# Patient Record
Sex: Male | Born: 1945
Health system: Southern US, Community
[De-identification: ages and names within clinical notes are randomized; demographics above are authoritative.]

## PROBLEM LIST (undated history)

## (undated) DIAGNOSIS — R7303 Prediabetes: Secondary | ICD-10-CM

## (undated) DIAGNOSIS — C61 Malignant neoplasm of prostate: Secondary | ICD-10-CM

## (undated) DIAGNOSIS — I10 Essential (primary) hypertension: Secondary | ICD-10-CM

## (undated) DIAGNOSIS — I5189 Other ill-defined heart diseases: Secondary | ICD-10-CM

## (undated) DIAGNOSIS — M545 Low back pain, unspecified: Secondary | ICD-10-CM

## (undated) DIAGNOSIS — I340 Nonrheumatic mitral (valve) insufficiency: Secondary | ICD-10-CM

## (undated) DIAGNOSIS — R911 Solitary pulmonary nodule: Secondary | ICD-10-CM

## (undated) DIAGNOSIS — R011 Cardiac murmur, unspecified: Secondary | ICD-10-CM

## (undated) DIAGNOSIS — H40059 Ocular hypertension, unspecified eye: Secondary | ICD-10-CM

## (undated) DIAGNOSIS — H9042 Sensorineural hearing loss, unilateral, left ear, with unrestricted hearing on the contralateral side: Secondary | ICD-10-CM

## (undated) DIAGNOSIS — K921 Melena: Secondary | ICD-10-CM

## (undated) DIAGNOSIS — I2510 Atherosclerotic heart disease of native coronary artery without angina pectoris: Secondary | ICD-10-CM

## (undated) DIAGNOSIS — T7840XA Allergy, unspecified, initial encounter: Secondary | ICD-10-CM

## (undated) DIAGNOSIS — L570 Actinic keratosis: Secondary | ICD-10-CM

## (undated) DIAGNOSIS — K635 Polyp of colon: Secondary | ICD-10-CM

## (undated) DIAGNOSIS — D2330 Other benign neoplasm of skin of unspecified part of face: Secondary | ICD-10-CM

## (undated) DIAGNOSIS — J302 Other seasonal allergic rhinitis: Secondary | ICD-10-CM

## (undated) DIAGNOSIS — I679 Cerebrovascular disease, unspecified: Secondary | ICD-10-CM

## (undated) DIAGNOSIS — Z8619 Personal history of other infectious and parasitic diseases: Secondary | ICD-10-CM

## (undated) DIAGNOSIS — I38 Endocarditis, valve unspecified: Secondary | ICD-10-CM

## (undated) DIAGNOSIS — C44621 Squamous cell carcinoma of skin of unspecified upper limb, including shoulder: Secondary | ICD-10-CM

## (undated) HISTORY — DX: Other benign neoplasm of skin of unspecified part of face: D23.30

## (undated) HISTORY — DX: Other ill-defined heart diseases: I51.89

## (undated) HISTORY — DX: Actinic keratosis: L57.0

## (undated) HISTORY — DX: Cardiac murmur, unspecified: R01.1

## (undated) HISTORY — DX: Melena: K92.1

## (undated) HISTORY — DX: Atherosclerotic heart disease of native coronary artery without angina pectoris: I25.10

## (undated) HISTORY — DX: Personal history of other infectious and parasitic diseases: Z86.19

## (undated) HISTORY — DX: Ocular hypertension, unspecified eye: H40.059

## (undated) HISTORY — DX: Malignant neoplasm of prostate: C61

## (undated) HISTORY — DX: Endocarditis, valve unspecified: I38

## (undated) HISTORY — DX: Polyp of colon: K63.5

## (undated) HISTORY — DX: Solitary pulmonary nodule: R91.1

## (undated) HISTORY — DX: Allergy, unspecified, initial encounter: T78.40XA

## (undated) HISTORY — DX: Low back pain, unspecified: M54.50

## (undated) HISTORY — DX: Cerebrovascular disease, unspecified: I67.9

## (undated) HISTORY — DX: Prediabetes: R73.03

## (undated) HISTORY — DX: Other seasonal allergic rhinitis: J30.2

## (undated) HISTORY — DX: Squamous cell carcinoma of skin of unspecified upper limb, including shoulder: C44.621

## (undated) HISTORY — DX: Nonrheumatic mitral (valve) insufficiency: I34.0

## (undated) HISTORY — DX: Essential (primary) hypertension: I10

## (undated) HISTORY — DX: Low back pain: M54.5

## (undated) HISTORY — DX: Sensorineural hearing loss, unilateral, left ear, with unrestricted hearing on the contralateral side: H90.42

---

## 1986-07-03 HISTORY — PX: LAMINECTOMY: SHX219

## 1998-07-03 HISTORY — PX: OTHER SURGICAL HISTORY: SHX169

## 1999-05-02 ENCOUNTER — Encounter: Payer: Self-pay | Admitting: Neurosurgery

## 1999-05-03 ENCOUNTER — Observation Stay (HOSPITAL_COMMUNITY): Admission: RE | Admit: 1999-05-03 | Discharge: 1999-05-03 | Payer: Self-pay | Admitting: Neurosurgery

## 1999-05-03 ENCOUNTER — Encounter: Payer: Self-pay | Admitting: Neurosurgery

## 2001-07-03 HISTORY — PX: BUNIONECTOMY: SHX129

## 2005-05-03 DIAGNOSIS — K635 Polyp of colon: Secondary | ICD-10-CM

## 2005-05-03 HISTORY — PX: COLONOSCOPY: SHX174

## 2005-05-03 HISTORY — DX: Polyp of colon: K63.5

## 2010-12-30 LAB — LIPID PANEL
Cholesterol, Total: 172
HDL: 49 mg/dL (ref 35–70)
LDL CALC: 96
TRIGLYCERIDES: 135

## 2010-12-30 LAB — COMPREHENSIVE METABOLIC PANEL
ALT: 20 U/L (ref 10–40)
AST: 26 U/L
Alkaline Phosphatase: 63 U/L
Creat: 0.95
GLUCOSE: 90
Potassium: 4.8 mmol/L
Sodium: 139 mmol/L (ref 137–147)
Total Bilirubin: 0.3 mg/dL

## 2010-12-30 LAB — PSA: PSA: 1.8

## 2010-12-30 LAB — HEMOGLOBIN A1C: A1c: 6

## 2011-07-04 HISTORY — PX: DOBUTAMINE STRESS ECHO: SHX5426

## 2012-02-29 DIAGNOSIS — R079 Chest pain, unspecified: Secondary | ICD-10-CM | POA: Insufficient documentation

## 2012-03-06 LAB — CBC
HEMOGLOBIN: 14.2 g/dL
WBC: 4.8
platelet count: 202

## 2012-05-15 ENCOUNTER — Ambulatory Visit: Payer: Self-pay | Admitting: Internal Medicine

## 2012-05-27 DIAGNOSIS — M549 Dorsalgia, unspecified: Secondary | ICD-10-CM | POA: Insufficient documentation

## 2012-07-03 HISTORY — PX: SQUAMOUS CELL CARCINOMA EXCISION: SHX2433

## 2013-03-12 ENCOUNTER — Encounter: Payer: Self-pay | Admitting: Adult Health

## 2013-03-12 ENCOUNTER — Ambulatory Visit (INDEPENDENT_AMBULATORY_CARE_PROVIDER_SITE_OTHER): Payer: Medicare Other | Admitting: Adult Health

## 2013-03-12 VITALS — BP 146/64 | HR 68 | Temp 98.5°F | Resp 12 | Ht 67.5 in | Wt 168.5 lb

## 2013-03-12 DIAGNOSIS — M545 Low back pain, unspecified: Secondary | ICD-10-CM

## 2013-03-12 DIAGNOSIS — M79605 Pain in left leg: Secondary | ICD-10-CM | POA: Insufficient documentation

## 2013-03-12 MED ORDER — HYDROCODONE-ACETAMINOPHEN 5-325 MG PO TABS: 1.0000 | ORAL_TABLET | Freq: Four times a day (QID) | ORAL | Status: DC | PRN

## 2013-03-12 MED ORDER — PREDNISONE 10 MG PO TABS: ORAL_TABLET | ORAL | Status: DC

## 2013-03-12 NOTE — Assessment & Plan Note (Addendum)
Prednisone taper. Continue muscle relaxer. Short course of Norco for pain. Ice to area for 15 min. Apply 3-4 times daily. Pillow under knees while sleeping. Tens unit may be beneficial. If symptoms do not improve will send for MRI.

## 2013-03-12 NOTE — Patient Instructions (Addendum)
  Start prednisone taper (6 tablets on the first day and reduce by one tablet daily until done).  I am prescribing a short course of Vicodin while you are on the prednisone taper. You can take one tablet every 6 hours as needed for pain. This medication causes sedation. Please do not drive while taking this medication.  Flexeril for muscle spasms. This will make you sleepy so only take this at bedtime when you are working.  Apply ice alternating with heat to the affected areas for 15 min at a time. Do this approximately 3-4 times daily.  Use a firm pillow between your knees when you lie on your side or under your knees when you lie on your back.  A tens unit will also be beneficial.

## 2013-03-12 NOTE — Progress Notes (Signed)
Subjective:    Patient ID: Aaron Brown, male    DOB: [birth year], 67 y.o.   MRN: [id number]  HPI  Patient is a pleasant 67 y/o male who presents to clinic to establish care. He was followed by Dr. Alison Murray and we will request his medical records. He has ongoing problems with his back. Pt has a hx of back issues and is s/p back sgy in 1988 and then again in 2000 (laminectomy, lumbar microdiskectomy). He experienced stiffness in the low back area, left hip and thigh pain. The thigh is now slightly numb. He takes muscle relaxers and lodine with relief. At this time pain is rated at a 3/10. Patient reports having several exacerbations of back discomfort yearly. He tries to stay active by walking and doing exercise. During flare ups he is not able to exercise. He recently tried yoga and found this to be beneficial.   Past Medical History  Diagnosis Date  . Colon polyps   . Hypertension      Past Surgical History  Procedure Laterality Date  . Laminectomy  1988    Dr. Hope Pigeon  . Hemi-laminectomy  2000    Dr. Jordan Likes, Dr. Gerlene Fee  . Bunionectomy  2002    Dr. Elvin So     Family History  Problem Relation Age of Onset  . Hypertension Mother   . Arthritis Father   . Hypertension Father   . Alcohol abuse Maternal Grandfather      History   Social History  . Marital Status: Married    Spouse Name: N/A    Number of Children: N/A  . Years of Education: N/A   Occupational History  . Not on file.   Social History Main Topics  . Smoking status: Never Smoker   . Smokeless tobacco: Never Used  . Alcohol Use: Yes  . Drug Use: No  . Sexual Activity: Not on file   Other Topics Concern  . Not on file   Social History Narrative  . No narrative on file    Review of Systems  Constitutional: Negative.   HENT: Negative.   Eyes: Negative.   Respiratory: Negative.   Gastrointestinal: Negative.   Endocrine: Negative.   Genitourinary: Negative.   Musculoskeletal: Positive for  back pain.  Skin: Negative.   Neurological: Positive for numbness.       Numbness and tingling of the left thigh  Hematological: Negative.   Psychiatric/Behavioral: Negative.     BP 146/64  Pulse 68  Temp(Src) 98.5 F (36.9 C) (Oral)  Resp 12  Ht 5' 7.5" (1.715 m)  Wt 168 lb 8 oz (76.431 kg)  BMI 25.99 kg/m2  SpO2 97%     Objective:   Physical Exam  Constitutional: He is oriented to person, place, and time. He appears well-developed and well-nourished. No distress.  Neck: Normal range of motion. Neck supple. No thyromegaly present.  Cardiovascular: Normal rate, regular rhythm, normal heart sounds and intact distal pulses.  Exam reveals no gallop and no friction rub.   No murmur heard. Pulmonary/Chest: Effort normal and breath sounds normal. No respiratory distress. He has no wheezes. He has no rales.  Abdominal: Soft. Bowel sounds are normal. He exhibits no distension and no mass. There is no tenderness. There is no rebound and no guarding.  Musculoskeletal: He exhibits edema. He exhibits no tenderness.  Decreased ROM when bending at the hip. No pain with straight leg raise. No point tenderness. Slight swelling noted left lower back area. Muscles of the  region extremely tight.  Lymphadenopathy:    He has no cervical adenopathy.  Neurological: He is alert and oriented to person, place, and time. He has normal reflexes. No cranial nerve deficit. Coordination normal.  Skin: Skin is dry.  Psychiatric: He has a normal mood and affect. His behavior is normal. Judgment and thought content normal.      Assessment & Plan:

## 2013-05-20 ENCOUNTER — Ambulatory Visit: Payer: Medicare Other | Admitting: Family Medicine

## 2013-06-26 DIAGNOSIS — C4442 Squamous cell carcinoma of skin of scalp and neck: Secondary | ICD-10-CM

## 2013-06-26 HISTORY — DX: Squamous cell carcinoma of skin of scalp and neck: C44.42

## 2013-06-30 ENCOUNTER — Telehealth: Payer: Self-pay | Admitting: Adult Health

## 2013-06-30 NOTE — Telephone Encounter (Signed)
Spoke with pt, Lisinopril was not on his medication list, pt states it should be, has been taking 20 mg daily "for years". States the refill was sent to his previous doctor accidentally, and it has been refilled and ready for pickup. Does not need refill right now. Med added to list.

## 2013-06-30 NOTE — Telephone Encounter (Signed)
Patient stopped in with his request for refill on his lisinopril 20 mg take 1 tablet (20 mg) total by mouth daily he uses [facility] on [address] In [place] he took his last one yesterday. Please call patient when this has been called in.

## 2013-07-02 ENCOUNTER — Other Ambulatory Visit: Payer: Self-pay | Admitting: Adult Health

## 2013-07-02 ENCOUNTER — Telehealth: Payer: Self-pay | Admitting: Family Medicine

## 2013-07-02 DIAGNOSIS — C44320 Squamous cell carcinoma of skin of unspecified parts of face: Secondary | ICD-10-CM

## 2013-07-02 NOTE — Telephone Encounter (Signed)
Did not realize the apptmt pt had in November was a cancelled apptmt. Notified pt Dr. Sharen Hones would have to see him as a pt prior to authorizing a referral. Pt is going to call Dr. Hassie Bruce to see if she can authorize it and keep his new pt apptmt w/Dr. Sharen Hones for 07/30/2013.

## 2013-07-02 NOTE — Telephone Encounter (Signed)
Pt is requesting referral to [facility] in [place], to Dr. Willeen Niece.  Can you put the referral in or does pt need a visit first? Thank you.

## 2013-07-02 NOTE — Telephone Encounter (Signed)
Spoke with pt, already a patient there, recently had squamous cell ca removed, needs referral due to [facility] beginning the first of the year, appt already sch 07/08/13

## 2013-07-02 NOTE — Telephone Encounter (Signed)
Pt came in today wanting to know if raquel will do a referral to [facility] he has an appointment 07/08/13 @8  with Dr Loyal Buba His insurance is [employer]  Copy scanned to chart On his only insurance he didn't need a referral he made this appointment himself

## 2013-07-14 ENCOUNTER — Telehealth: Payer: Self-pay | Admitting: Emergency Medicine

## 2013-07-14 NOTE — Telephone Encounter (Signed)
Per [employer], patient was approved to see Dr. Nicole Kindred, 4 visits exp 10/06/13. Auth # [id number]

## 2013-07-30 ENCOUNTER — Ambulatory Visit (INDEPENDENT_AMBULATORY_CARE_PROVIDER_SITE_OTHER): Payer: Medicare HMO | Admitting: Family Medicine

## 2013-07-30 ENCOUNTER — Encounter: Payer: Self-pay | Admitting: Family Medicine

## 2013-07-30 VITALS — BP 138/92 | HR 76 | Temp 98.2°F | Ht 68.0 in | Wt 169.8 lb

## 2013-07-30 DIAGNOSIS — Z85828 Personal history of other malignant neoplasm of skin: Secondary | ICD-10-CM

## 2013-07-30 DIAGNOSIS — J302 Other seasonal allergic rhinitis: Secondary | ICD-10-CM

## 2013-07-30 DIAGNOSIS — H409 Unspecified glaucoma: Secondary | ICD-10-CM | POA: Insufficient documentation

## 2013-07-30 DIAGNOSIS — M545 Low back pain, unspecified: Secondary | ICD-10-CM | POA: Insufficient documentation

## 2013-07-30 DIAGNOSIS — I10 Essential (primary) hypertension: Secondary | ICD-10-CM

## 2013-07-30 DIAGNOSIS — J309 Allergic rhinitis, unspecified: Secondary | ICD-10-CM

## 2013-07-30 DIAGNOSIS — H40009 Preglaucoma, unspecified, unspecified eye: Secondary | ICD-10-CM

## 2013-07-30 DIAGNOSIS — M79672 Pain in left foot: Secondary | ICD-10-CM

## 2013-07-30 DIAGNOSIS — M79609 Pain in unspecified limb: Secondary | ICD-10-CM

## 2013-07-30 DIAGNOSIS — R7303 Prediabetes: Secondary | ICD-10-CM | POA: Insufficient documentation

## 2013-07-30 DIAGNOSIS — H40059 Ocular hypertension, unspecified eye: Secondary | ICD-10-CM

## 2013-07-30 MED ORDER — LISINOPRIL 20 MG PO TABS: 20.0000 mg | ORAL_TABLET | Freq: Every day | ORAL | Status: DC

## 2013-07-30 NOTE — Assessment & Plan Note (Signed)
Same foot s/p bunionectomy. May be developing hammer toe - will monitor for now, encouraged wide toed shoes. If worsening pain, will call us for referral back to podiatry.

## 2013-07-30 NOTE — Assessment & Plan Note (Signed)
Followed by ophtho.

## 2013-07-30 NOTE — Assessment & Plan Note (Signed)
Long h/o this s/p surgeries in past - will continue to monitor.  Currently pain free.

## 2013-07-30 NOTE — Assessment & Plan Note (Signed)
Chronic, stable. Continue lisinopril.  

## 2013-07-30 NOTE — Progress Notes (Signed)
Pre-visit discussion using our clinic review tool. No additional management support is needed unless otherwise documented below in the visit note.  

## 2013-07-30 NOTE — Progress Notes (Signed)
Subjective:    Patient ID: Aaron Brown., male    DOB: 1946/01/23, 68 y.o.   MRN: [id number]  HPI CC: transfer of care  Pleasant 68 yo presents today to transfer care from [facility].  Prior to this saw Dr. Heber [place].  Was Government social research officer, now working in Biomedical scientist.  Less stress.  Works outside - prone to skin cancers (has had 2 squamous cell cancers removed (R neck and L hand)).  Last saw [facility] Dr. Nicole Kindred 07/2013 - will need referral from [place] to return in 10/2013.  High pressures in eye - sees Dr. Gloriann Loan in [place] (last saw 2 months ago).  H/o lumbar disc disease s/p 2 laminectomies latest 2000.  Currently no back pain. Some pain at left foot - wonders if related to back.  Notices toes curling up occasionally with pain with walking.  HTN - Compliant with current antihypertensive regimen of lisinopril 20mg  daily.  Does not check blood pressures at home.  No low blood pressure symptoms of dizziness/syncope.  Denies HA, vision changes, CP/tightness, SOB, leg swelling. Did have episode of chest pain (03/2012) with prior PCP s/p normal EKG and treadmill stress test at [facility].  Frequent headaches resolved after he retired.  Lives with wife grown children Occupation: was Government social research officer, now owns landscaping business Edu: College BA Activity: stays active at work Diet: good water, fruits/vegetables some  Preventative: Last CPE 2012 with Dr. Sherlene Shams in [place] Colonoscopy 05/2005 - 1 polyp, int hemorrhoid ([facility]) Prostate screening - does do Flu shot - declines Pneumovax 07/2011 Tdap 12/2011 Zostavax 2013    Medications and allergies reviewed and updated in chart.  Past histories reviewed and updated if relevant as below. Patient Active Problem List   Diagnosis Date Noted  . Low back pain radiating to left leg 03/12/2013   Past Medical History  Diagnosis Date  . Colon polyps   . Hypertension   . History of chicken pox   . Blood in stool     in  the past; ? hemorrhoids  . Seasonal allergies   . Squamous cell cancer of skin of hand   . Squamous acanthoma of face   . Frequent headaches    Past Surgical History  Procedure Laterality Date  . Laminectomy  1988    Dr. Pearlie Oyster; L5  . Hemi-laminectomy  2000    Dr. Annette Stable, Dr. Hal Neer L4/L5  . Bunionectomy  2003    Dr. Barkley Bruns  . Squamous cell carcinoma excision  2014    face and hand   History  Substance Use Topics  . Smoking status: Never Smoker   . Smokeless tobacco: Never Used  . Alcohol Use: Yes     Comment: Occasional-wine   Family History  Problem Relation Age of Onset  . Hypertension Mother   . Arthritis Father   . Hypertension Father   . Alcohol abuse Maternal Grandfather    No Known Allergies Current Outpatient Prescriptions on File Prior to Visit  Medication Sig Dispense Refill  . beta carotene w/minerals (OCUVITE) tablet Take 1 tablet by mouth daily.      Marland Kitchen lisinopril (PRINIVIL,ZESTRIL) 20 MG tablet Take 20 mg by mouth daily.      Marland Kitchen etodolac (LODINE) 400 MG tablet Take 400 mg by mouth 2 (two) times daily.        No current facility-administered medications on file prior to visit.     Review of Systems Per HPI    Objective:   Physical Exam  Nursing note and vitals reviewed. Constitutional: He is oriented to person, place, and time. He appears well-developed and well-nourished. No distress.  HENT:  Head: Normocephalic and atraumatic.  Right Ear: Hearing, tympanic membrane, external ear and ear canal normal.  Left Ear: Hearing, tympanic membrane, external ear and ear canal normal.  Mouth/Throat: Oropharynx is clear and moist. No oropharyngeal exudate.  Eyes: Conjunctivae and EOM are normal. Pupils are equal, round, and reactive to light. No scleral icterus.  Neck: Normal range of motion. Neck supple. Carotid bruit is not present. No thyromegaly present.  Cardiovascular: Normal rate, regular rhythm, normal heart sounds and intact distal pulses.   No  murmur heard. Pulses:      Radial pulses are 2+ on the right side, and 2+ on the left side.  Occasional skipped beat  Pulmonary/Chest: Effort normal and breath sounds normal. No respiratory distress. He has no wheezes. He has no rales.  Musculoskeletal: Normal range of motion. He exhibits no edema and no tenderness.  Left foot - no skin callus or skin breakdown appreciated  Lymphadenopathy:    He has no cervical adenopathy.  Neurological: He is alert and oriented to person, place, and time.  CN grossly intact, station and gait intact  Skin: Skin is warm and dry. No rash noted.  Psychiatric: He has a normal mood and affect. His behavior is normal. Judgment and thought content normal.       Assessment & Plan:

## 2013-07-30 NOTE — Assessment & Plan Note (Signed)
Will refer to derm when pt calls back.

## 2013-07-30 NOTE — Patient Instructions (Addendum)
Return as needed or in 3-4 months for wellness exam, prior fasting for blood work. i've refilled lisinopril. Let us know when you need referral for skin doctor or if foot pain getting worse.

## 2013-08-04 ENCOUNTER — Encounter: Payer: Self-pay | Admitting: *Deleted

## 2013-08-04 ENCOUNTER — Telehealth: Payer: Self-pay | Admitting: Family Medicine

## 2013-08-04 NOTE — Telephone Encounter (Signed)
Relevant patient education mailed to patient.  

## 2013-09-28 ENCOUNTER — Encounter: Payer: Self-pay | Admitting: Family Medicine

## 2013-10-09 ENCOUNTER — Encounter: Payer: Self-pay | Admitting: Family Medicine

## 2013-10-09 ENCOUNTER — Ambulatory Visit (INDEPENDENT_AMBULATORY_CARE_PROVIDER_SITE_OTHER): Payer: Medicare HMO | Admitting: Family Medicine

## 2013-10-09 VITALS — BP 120/64 | HR 77 | Temp 98.2°F | Wt 171.2 lb

## 2013-10-09 DIAGNOSIS — R220 Localized swelling, mass and lump, head: Secondary | ICD-10-CM | POA: Insufficient documentation

## 2013-10-09 DIAGNOSIS — K130 Diseases of lips: Secondary | ICD-10-CM

## 2013-10-09 MED ORDER — MUPIROCIN 2 % EX OINT: 1.0000 "application " | TOPICAL_OINTMENT | Freq: Three times a day (TID) | CUTANEOUS | Status: DC

## 2013-10-09 MED ORDER — AMLODIPINE BESYLATE 5 MG PO TABS: 5.0000 mg | ORAL_TABLET | Freq: Every day | ORAL | Status: DC

## 2013-10-09 NOTE — Patient Instructions (Addendum)
I think this is from lisinopril leading to lip swelling. Let's stop lisinopril, continue plain vaseline, start mupirocin ointment three times daily to cover bacterial infection, and start new blood presure medicine called amlodipine 5mg  daily.  Watch blood pressure and let me know if staying elevated despite new medicine Let us know if spreading redness or swelling, or any fevers/chills or other soft tissue swelling despite above. Call me with questions.  Angioedema Angioedema is a sudden swelling of tissues, often of the skin. It can occur on the face or genitals or in the abdomen or other body parts. The swelling usually develops over a short period and gets better in 24 to 48 hours. It often begins during the night and is found when the person wakes up. The person may also get red, itchy patches of skin (hives). Angioedema can be dangerous if it involves swelling of the air passages.  Depending on the cause, episodes of angioedema may only happen once, come back in unpredictable patterns, or repeat for several years and then gradually fade away.  CAUSES  Angioedema can be caused by an allergic reaction to various triggers. It can also result from nonallergic causes, including reactions to drugs, immune system disorders, viral infections, or an abnormal gene that is passed to you from your parents (hereditary). For some people with angioedema, the cause is unknown.  Some things that can trigger angioedema include:   Foods.   Medicines, such as ACE inhibitors, ARBs, nonsteroidal anti-inflammatory agents, or estrogen.   Latex.   Animal saliva.   Insect stings.   Dyes used in X-rays.   Mild injury.   Dental work.  Surgery.  Stress.   Sudden changes in temperature.   Exercise. SIGNS AND SYMPTOMS   Swelling of the skin.  Hives. If these are present, there is also intense itching.  Redness in the affected area.   Pain in the affected area.  Swollen lips or  tongue.  Breathing problems. This may happen if the air passages swell.  Wheezing. If internal organs are involved, there may be:   Nausea.   Abdominal pain.   Vomiting.   Difficulty swallowing.   Difficulty passing urine. DIAGNOSIS   Your health care provider will examine the affected area and take a medical and family history.  Various tests may be done to help determine the cause. Tests may include:  Allergy skin tests to see if the problem is an allergic reaction.   Blood tests to check for hereditary angioedema.   Tests to check for underlying diseases that could cause the condition.   A review of your medicines, including over the counter medicines, may be done. TREATMENT  Treatment will depend on the cause of the angioedema. Possible treatments include:   Removal of anything that triggered the condition (such as stopping certain medicines).   Medicines to treat symptoms or prevent attacks. Medicines given may include:   Antihistamines.   Epinephrine injection.   Steroids.   Hospitalization may be required for severe attacks. If the air passages are affected, it can be an emergency. Tubes may need to be placed to keep the airway open. HOME CARE INSTRUCTIONS   Only take over-the-counter or prescription medicines as directed by your health care provider.  If you were given medicines for emergency allergy treatment, always carry them with you.  Wear a medical bracelet as directed by your health care provider.   Avoid known triggers. SEEK MEDICAL CARE IF:   You have repeat attacks of  angioedema.   Your attacks are more frequent or more severe despite preventive measures.   You have hereditary angioedema and are considering having children. It is important to discuss the risks of passing the condition on to your children with your health care provider. SEEK IMMEDIATE MEDICAL CARE IF:   You have severe swelling of the mouth, tongue, or  lips.  You have difficulty breathing.   You have difficulty swallowing.   You faint. MAKE SURE YOU:  Understand these instructions.  Will watch your condition.  Will get help right away if you are not doing well or get worse. Document Released: 08/28/2001 Document Revised: 04/09/2013 Document Reviewed: 02/10/2013 [facility] Patient Information 2014 [place], [place].

## 2013-10-09 NOTE — Progress Notes (Signed)
   BP 120/64  Pulse 77  Temp(Src) 98.2 F (36.8 C) (Oral)  Wt 171 lb 4 oz (77.678 kg)  SpO2 96%   CC: raw lips  Subjective:    Patient ID: [name]., male    DOB: June 12, 1946, 68 y.o.   MRN: [id number]  HPI: Benjermin Korber. is a 68 y.o. male presenting on 10/09/2013 for Raw lips   Raw lips - over last 2 weeks noticing swelling of lower lip and some breaks in skin. Has been using night time carmex for 4-6 months. Per pharmacist recommended use aquaphor for last 1.5 weeks. Chap stick hasn't helped either. Lips not healing.  Mild dry cough present in morning since lisinopril was started (4-5 yrs ago).  No tongue swelling, throat swelling.  No abd pain.  No dyspnea or nausea.  No dysphagia. H/o cold sores but this feels different.  Relevant past medical, surgical, family and social history reviewed and updated as indicated.  Allergies and medications reviewed and updated. Current Outpatient Prescriptions on File Prior to Visit  Medication Sig  . beta carotene w/minerals (OCUVITE) tablet Take 1 tablet by mouth daily.  Marland Kitchen etodolac (LODINE) 400 MG tablet Take 400 mg by mouth 2 (two) times daily.    No current facility-administered medications on file prior to visit.    Review of Systems Per HPI unless specifically indicated above    Objective:    BP 120/64  Pulse 77  Temp(Src) 98.2 F (36.8 C) (Oral)  Wt 171 lb 4 oz (77.678 kg)  SpO2 96%  Physical Exam  Nursing note and vitals reviewed. Constitutional: He appears well-developed and well-nourished. No distress.  HENT:  Head: Normocephalic and atraumatic.  Nose: Nose normal.  Mouth/Throat: Oropharynx is clear and moist and mucous membranes are normal. Oral lesions present. No oropharyngeal exudate, posterior oropharyngeal edema, posterior oropharyngeal erythema or tonsillar abscesses.    Focal swelling of lower lip along with ulceration Upper lip spared.  No tongue or oropharyngeal swelling.  Eyes:  Conjunctivae and EOM are normal. Pupils are equal, round, and reactive to light. No scleral icterus.  Neck: Normal range of motion. Neck supple. No thyromegaly present.  Musculoskeletal: He exhibits no edema.  Lymphadenopathy:    He has no cervical adenopathy.      Assessment & Plan:   Problem List Items Addressed This Visit   Lip swelling - Primary     In h/o ACEI use, need to consider angioedema so will stop lisinopril and start amlodipine 5mg  daily. DDx includes herpetic infection vs impetigo - start mupirocin and sent viral culture for HSV2. Red flags to return discussed. Pt agrees with plan.        Follow up plan: Return if symptoms worsen or fail to improve.

## 2013-10-09 NOTE — Progress Notes (Signed)
Pre visit review using our clinic review tool, if applicable. No additional management support is needed unless otherwise documented below in the visit note. 

## 2013-10-09 NOTE — Assessment & Plan Note (Addendum)
In h/o ACEI use, need to consider angioedema so will stop lisinopril and start amlodipine 5mg  daily. DDx includes herpetic infection vs impetigo - start mupirocin and sent viral culture for HSV2. Red flags to return discussed. Pt agrees with plan.

## 2013-10-10 ENCOUNTER — Telehealth: Payer: Self-pay

## 2013-10-10 NOTE — Telephone Encounter (Signed)
Ulcer on mid lower lip

## 2013-10-10 NOTE — Telephone Encounter (Signed)
Aaron Brown with [facility] left v/m requesting source of viral culture on 10/09/13. Aaron Brown request cb.

## 2013-10-10 NOTE — Telephone Encounter (Signed)
Left message on [facility]'s voicemail letting them know the source of the viral cx done on pt

## 2013-10-14 ENCOUNTER — Telehealth: Payer: Self-pay | Admitting: Family Medicine

## 2013-10-14 NOTE — Telephone Encounter (Signed)
Pt wants to know if the swab results from his lip have come back yet and if so what are the results.  Please call patient. Thank you.

## 2013-10-15 NOTE — Telephone Encounter (Signed)
plz notify test hasn't returned yet - viral cultures can take 1-2 weeks to run.

## 2013-10-15 NOTE — Telephone Encounter (Signed)
Message left notifying patient.

## 2013-10-20 ENCOUNTER — Encounter: Payer: Self-pay | Admitting: *Deleted

## 2013-10-20 LAB — VIRAL CULTURE VIRC: Organism ID, Bacteria: NEGATIVE

## 2013-10-29 ENCOUNTER — Other Ambulatory Visit: Payer: Self-pay | Admitting: Family Medicine

## 2013-10-30 NOTE — Telephone Encounter (Signed)
Pt request status of refills requested on 10/29/13; advised pt refills sent to [facility] on 10/29/13. Pt appreciative and will ck with pharmacy.

## 2013-11-04 ENCOUNTER — Ambulatory Visit (INDEPENDENT_AMBULATORY_CARE_PROVIDER_SITE_OTHER): Payer: Commercial Managed Care - HMO | Admitting: Family Medicine

## 2013-11-04 ENCOUNTER — Encounter: Payer: Self-pay | Admitting: Family Medicine

## 2013-11-04 VITALS — BP 146/84 | HR 68 | Temp 98.3°F | Wt 169.0 lb

## 2013-11-04 DIAGNOSIS — M545 Low back pain, unspecified: Secondary | ICD-10-CM

## 2013-11-04 NOTE — Assessment & Plan Note (Signed)
Anticipate acute flare of known lumbar HNP issues brought on by active work/lifestyle Encouraged limiting heavy lifting and repetitive twisting/bending in h/o laminectomy. Provided with exercises from [facility] pt advisor and provided with script for lumbar back brace to use when at work. Continue etodolac and flexeril (1/2 dose) as up to now. No need for further imaging as sxs improving. Update if worsening again.  Pt agrees with plan.

## 2013-11-04 NOTE — Progress Notes (Signed)
Pre visit review using our clinic review tool, if applicable. No additional management support is needed unless otherwise documented below in the visit note. 

## 2013-11-04 NOTE — Patient Instructions (Signed)
I do think you had flare of prior back issues - do exercises provided today.  Continue etodolac and flexeril (may cut in half).  Ice to back.  Back brace script provided today. Let me know if any worsening or not improving with this.

## 2013-11-04 NOTE — Progress Notes (Signed)
   BP 146/84  Pulse 68  Temp(Src) 98.3 F (36.8 C) (Oral)  Wt 169 lb (76.658 kg)   CC: back pain  Subjective:    Patient ID: Aaron Evangelist., male    DOB: 30-Dec-1945, 68 y.o.   MRN: [id number]  HPI: Aaron Swallows. is a 68 y.o. 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  10/23/2013 had incident to lower back - caused worsening back pain.  Slowly improved with Lodine and Flexeril.  Then again had another episode 10/28/2013 where he re injured back (continued working through this). Also using back brace, cold packs, friend's TENS unit. No radiation of pain - stayed L lower back.  Possible numbness down left leg, no longer currently.  Denies fever/schills, bowel/bladder accidents. Actually today pain significantly improved but wanted to be evaluated regardless.  H/o bulging disc with spinal stenosis L4/5 s/p surgery x2 (1988, 2000). Tries to avoid heavy lifting at work.  Relevant past medical, surgical, family and social history reviewed and updated as indicated.  Allergies and medications reviewed and updated. Current Outpatient Prescriptions on File Prior to Visit  Medication Sig  . amLODipine (NORVASC) 5 MG tablet Take 1 tablet (5 mg total) by mouth daily.  . beta carotene w/minerals (OCUVITE) tablet Take 1 tablet by mouth daily.  . cyclobenzaprine (FLEXERIL) 10 MG tablet TAKE 1 TABLET BY MOUTH 3 TIMES EVERY DAY AS NEEDED MUSCLE SPASM OR TIGHTNESS  . etodolac (LODINE) 400 MG tablet TAKE 1 TABLET BY MOUTH 2 TIMES EVERY DAY WITH FOOD  . mupirocin ointment (BACTROBAN) 2 % Place 1 application into the nose 3 (three) times daily. To affected area lower lip   No current facility-administered medications on file prior to visit.    Review of Systems Per HPI unless specifically indicated above    Objective:    BP 146/84  Pulse 68  Temp(Src) 98.3 F (36.8 C) (Oral)  Wt 169 lb (76.658 kg)  Physical Exam    Nursing note and vitals reviewed. Constitutional: He is oriented to person, place, and time. He appears well-developed and well-nourished. No distress.  Musculoskeletal: He exhibits no edema.  No pain midline spine No paraspinous mm tenderness, but tightness noted along left lumbar mm Neg SLR bilaterally. No pain with int/ext rotation at hip. Neg FABER. No pain at SIJ, GTB or sciatic notch bilaterally.  Neurological: He is alert and oriented to person, place, and time. He has normal strength. No sensory deficit.  Reflex Scores:      Patellar reflexes are 2+ on the right side and 2+ on the left side. 5/5 strength BLE Sensation intact       Assessment & Plan:   Problem List Items Addressed This Visit   Lower back pain - Primary     Anticipate acute flare of known lumbar HNP issues brought on by active work/lifestyle Encouraged limiting heavy lifting and repetitive twisting/bending in h/o laminectomy. Provided with exercises from [facility] pt advisor and provided with script for lumbar back brace to use when at work. Continue etodolac and flexeril (1/2 dose) as up to now. No need for further imaging as sxs improving. Update if worsening again.  Pt agrees with plan.        Follow up plan: Return if symptoms worsen or fail to improve.

## 2013-12-01 DIAGNOSIS — I6789 Other cerebrovascular disease: Secondary | ICD-10-CM | POA: Insufficient documentation

## 2013-12-01 DIAGNOSIS — I679 Cerebrovascular disease, unspecified: Secondary | ICD-10-CM

## 2013-12-01 DIAGNOSIS — IMO0001 Reserved for inherently not codable concepts without codable children: Secondary | ICD-10-CM

## 2013-12-01 DIAGNOSIS — H9042 Sensorineural hearing loss, unilateral, left ear, with unrestricted hearing on the contralateral side: Secondary | ICD-10-CM | POA: Insufficient documentation

## 2013-12-01 HISTORY — DX: Reserved for inherently not codable concepts without codable children: IMO0001

## 2013-12-01 HISTORY — DX: Other cerebrovascular disease: I67.89

## 2013-12-03 ENCOUNTER — Telehealth: Payer: Self-pay | Admitting: Family Medicine

## 2013-12-03 ENCOUNTER — Ambulatory Visit (INDEPENDENT_AMBULATORY_CARE_PROVIDER_SITE_OTHER): Payer: Commercial Managed Care - HMO | Admitting: Family Medicine

## 2013-12-03 ENCOUNTER — Encounter: Payer: Self-pay | Admitting: Family Medicine

## 2013-12-03 VITALS — BP 122/80 | HR 68 | Temp 98.0°F | Ht 68.0 in | Wt 167.8 lb

## 2013-12-03 DIAGNOSIS — Z125 Encounter for screening for malignant neoplasm of prostate: Secondary | ICD-10-CM

## 2013-12-03 DIAGNOSIS — I10 Essential (primary) hypertension: Secondary | ICD-10-CM

## 2013-12-03 DIAGNOSIS — R7309 Other abnormal glucose: Secondary | ICD-10-CM

## 2013-12-03 DIAGNOSIS — H40009 Preglaucoma, unspecified, unspecified eye: Secondary | ICD-10-CM

## 2013-12-03 DIAGNOSIS — Z23 Encounter for immunization: Secondary | ICD-10-CM

## 2013-12-03 DIAGNOSIS — Z0000 Encounter for general adult medical examination without abnormal findings: Secondary | ICD-10-CM | POA: Insufficient documentation

## 2013-12-03 DIAGNOSIS — R7303 Prediabetes: Secondary | ICD-10-CM

## 2013-12-03 DIAGNOSIS — H40059 Ocular hypertension, unspecified eye: Secondary | ICD-10-CM

## 2013-12-03 DIAGNOSIS — H9190 Unspecified hearing loss, unspecified ear: Secondary | ICD-10-CM

## 2013-12-03 LAB — BASIC METABOLIC PANEL
BUN: 18 mg/dL (ref 6–23)
CALCIUM: 9.3 mg/dL (ref 8.4–10.5)
CO2: 30 meq/L (ref 19–32)
CREATININE: 1.1 mg/dL (ref 0.4–1.5)
Chloride: 103 mEq/L (ref 96–112)
GFR: 72.63 mL/min (ref >60.00)
GLUCOSE: 95 mg/dL (ref 70–99)
Potassium: 4.9 mEq/L (ref 3.5–5.1)
Sodium: 137 mEq/L (ref 135–145)

## 2013-12-03 LAB — LIPID PANEL
CHOLESTEROL: 159 mg/dL (ref 0–200)
HDL: 42.4 mg/dL (ref >39.00)
LDL CALC: 99 mg/dL (ref 0–99)
NonHDL: 116.6
TRIGLYCERIDES: 89 mg/dL (ref 0.0–149.0)
Total CHOL/HDL Ratio: 4
VLDL: 17.8 mg/dL (ref 0.0–40.0)

## 2013-12-03 LAB — PSA, MEDICARE: PSA: 2.42 ng/ml (ref 0.10–4.00)

## 2013-12-03 LAB — HEMOGLOBIN A1C: Hgb A1c MFr Bld: 6 % (ref 4.6–6.5)

## 2013-12-03 MED ORDER — AMLODIPINE BESYLATE 5 MG PO TABS: 5.0000 mg | ORAL_TABLET | Freq: Every day | ORAL | Status: DC

## 2013-12-03 NOTE — Telephone Encounter (Signed)
Relevant patient education mailed to patient.  

## 2013-12-03 NOTE — Assessment & Plan Note (Signed)
Followed regularly by ophtho.  

## 2013-12-03 NOTE — Patient Instructions (Signed)
We will refer you to hearing doctors for evaluation. prevnar today. Blood work today. Good to see you today, call us with questions.

## 2013-12-03 NOTE — Assessment & Plan Note (Signed)
Recheck today. 

## 2013-12-03 NOTE — Progress Notes (Signed)
BP 122/80  Pulse 68  Temp(Src) 98 F (36.7 C) (Oral)  Ht 5\' 8"  (1.727 m)  Wt 167 lb 12 oz (76.091 kg)  BMI 25.51 kg/m2   CC: medicare wellness visit  Subjective:    Patient ID: Aaron Evangelist., male    DOB: Sep 03, 1945, 68 y.o.   MRN: [id number]  HPI: Aaron Mabile. is a 68 y.o. male presenting on 12/03/2013 for Annual Exam   Trouble with hearing - wife notices too. Finds difficulty with hearing TV , requests audiology referral Vision screen deferred - last saw eye doctor 09/14/2013. Stable elevated eye pressures. No falls in last year Denies depression/anhedonia.  Preventative: COLONOSCOPY Date: 05/2005 1 polyp, int hem, told ok to rpt 10 yrs ([facility]) Prostate screening - desired. Flu shot - declines  Pneumovax 07/2011, prevnar today. Tdap 12/2011  Zostavax 2013  Advanced directive: has not set up but planning to. HCPOA would be wife then daughter.  Lives with wife  Grown children  Occupation: was Government social research officer, now owns landscaping business  Edu: Sports administrator  Activity: stays active at work, walking at home Diet: good water, fruits/vegetables some   Relevant past medical, surgical, family and social history reviewed and updated as indicated.  Allergies and medications reviewed and updated. Current Outpatient Prescriptions on File Prior to Visit  Medication Sig  . beta carotene w/minerals (OCUVITE) tablet Take 1 tablet by mouth daily.   No current facility-administered medications on file prior to visit.    Review of Systems Per HPI unless specifically indicated above    Objective:    BP 122/80  Pulse 68  Temp(Src) 98 F (36.7 C) (Oral)  Ht 5\' 8"  (1.727 m)  Wt 167 lb 12 oz (76.091 kg)  BMI 25.51 kg/m2  Physical Exam  Nursing note and vitals reviewed. Constitutional: He is oriented to person, place, and time. He appears well-developed and well-nourished. No distress.  HENT:  Head: Normocephalic and atraumatic.  Right Ear: Hearing, tympanic  membrane, external ear and ear canal normal.  Left Ear: Hearing, tympanic membrane, external ear and ear canal normal.  Nose: Nose normal.  Mouth/Throat: Uvula is midline, oropharynx is clear and moist and mucous membranes are normal. No oropharyngeal exudate, posterior oropharyngeal edema or posterior oropharyngeal erythema.  Eyes: Conjunctivae and EOM are normal. Pupils are equal, round, and reactive to light. No scleral icterus.  Neck: Normal range of motion. Neck supple. Carotid bruit is not present. No thyromegaly present.  Cardiovascular: Normal rate, regular rhythm, normal heart sounds and intact distal pulses.   No murmur heard. Pulses:      Radial pulses are 2+ on the right side, and 2+ on the left side.  Pulmonary/Chest: Effort normal and breath sounds normal. No respiratory distress. He has no wheezes. He has no rales.  Abdominal: Soft. Bowel sounds are normal. He exhibits no distension and no mass. There is no tenderness. There is no rebound and no guarding.  Genitourinary: Rectum normal and prostate normal. Rectal exam shows no external hemorrhoid, no internal hemorrhoid, no fissure, no mass, no tenderness and anal tone normal. Prostate is not enlarged (20gm) and not tender.  Musculoskeletal: Normal range of motion. He exhibits no edema.  Lymphadenopathy:    He has no cervical adenopathy.  Neurological: He is alert and oriented to person, place, and time.  CN grossly intact, station and gait intact Recall 3/3 Calculation 5/5 serial 7s  Skin: Skin is warm and dry. No rash noted.  Psychiatric: He  has a normal mood and affect. His behavior is normal. Judgment and thought content normal.       Assessment & Plan:   Problem List Items Addressed This Visit   Prediabetes     Recheck today.    Relevant Orders      Hemoglobin A1c      Basic metabolic panel   Medicare annual wellness visit, initial - Primary     I have personally reviewed the Medicare Annual Wellness  questionnaire and have noted 1. The patient's medical and social history 2. Their use of alcohol, tobacco or illicit drugs 3. Their current medications and supplements 4. The patient's functional ability including ADL's, fall risks, home safety risks and hearing or visual impairment. 5. Diet and physical activity 6. Evidence for depression or mood disorders The patients weight, height, BMI have been recorded in the chart.  Hearing and vision has been addressed. I have made referrals, counseling and provided education to the patient based review of the above and I have provided the pt with a written personalized care plan for preventive services. See scanned questionairre. Advanced directives discussed: wife would be HCPOA. Planning on setting this up.  Reviewed preventative protocols and updated unless pt declined. prevnar today. PSA/DRE today. Referred for audiology evaluation per pt request    Increased pressure in the eye     Followed regularly by ophtho    Hypertension     Chronic, stable. Continue amlodipine.    Relevant Medications      amLODIpine (NORVASC) tablet   Other Relevant Orders      Lipid panel      Basic metabolic panel    Other Visit Diagnoses   Decreased hearing        Relevant Orders       Ambulatory referral to Audiology    Special screening for malignant neoplasm of prostate        Relevant Orders       PSA, Medicare        Follow up plan: Return in about 1 year (around 12/04/2014), or as needed, for annual exam, prior fasting for blood work.

## 2013-12-03 NOTE — Assessment & Plan Note (Signed)
I have personally reviewed the Medicare Annual Wellness questionnaire and have noted 1. The patient's medical and social history 2. Their use of alcohol, tobacco or illicit drugs 3. Their current medications and supplements 4. The patient's functional ability including ADL's, fall risks, home safety risks and hearing or visual impairment. 5. Diet and physical activity 6. Evidence for depression or mood disorders The patients weight, height, BMI have been recorded in the chart.  Hearing and vision has been addressed. I have made referrals, counseling and provided education to the patient based review of the above and I have provided the pt with a written personalized care plan for preventive services. See scanned questionairre. Advanced directives discussed: wife would be HCPOA. Planning on setting this up.  Reviewed preventative protocols and updated unless pt declined. prevnar today. PSA/DRE today. Referred for audiology evaluation per pt request

## 2013-12-03 NOTE — Progress Notes (Signed)
Pre visit review using our clinic review tool, if applicable. No additional management support is needed unless otherwise documented below in the visit note. 

## 2013-12-03 NOTE — Addendum Note (Signed)
Addended by: Lurlean Nanny on: 12/03/2013 09:29 AM   Modules accepted: Orders

## 2013-12-03 NOTE — Assessment & Plan Note (Signed)
Chronic, stable. Continue amlodipine.  

## 2013-12-04 ENCOUNTER — Encounter: Payer: Self-pay | Admitting: *Deleted

## 2014-01-06 DIAGNOSIS — C4491 Basal cell carcinoma of skin, unspecified: Secondary | ICD-10-CM

## 2014-01-06 HISTORY — DX: Basal cell carcinoma of skin, unspecified: C44.91

## 2014-01-07 ENCOUNTER — Ambulatory Visit: Payer: Self-pay | Admitting: Otolaryngology

## 2014-01-17 ENCOUNTER — Encounter: Payer: Self-pay | Admitting: Family Medicine

## 2014-01-20 ENCOUNTER — Telehealth: Payer: Self-pay | Admitting: Family Medicine

## 2014-01-20 NOTE — Telephone Encounter (Signed)
Pt called checking on his [facility] referral He stated he went to [place] ent that did hearing and an mri done at [place] the radiologist @ [facility] wanted you to go to neurologist There was signs of TIA Referral to dr Manuella Ghazi  Pt stated he about a week ago with this information.

## 2014-01-26 NOTE — Telephone Encounter (Signed)
Referral done to Dr Manuella Ghazi

## 2014-05-20 ENCOUNTER — Ambulatory Visit: Payer: Self-pay | Admitting: Neurology

## 2014-08-31 ENCOUNTER — Ambulatory Visit (INDEPENDENT_AMBULATORY_CARE_PROVIDER_SITE_OTHER)
Admission: RE | Admit: 2014-08-31 | Discharge: 2014-08-31 | Disposition: A | Discharge status: Home / Self Care | Payer: Medicare PPO | Source: Ambulatory Visit | Attending: Family Medicine | Admitting: Family Medicine

## 2014-08-31 ENCOUNTER — Encounter: Payer: Self-pay | Admitting: Family Medicine

## 2014-08-31 ENCOUNTER — Ambulatory Visit (INDEPENDENT_AMBULATORY_CARE_PROVIDER_SITE_OTHER): Payer: Medicare PPO | Admitting: Family Medicine

## 2014-08-31 VITALS — BP 140/80 | HR 88 | Temp 98.2°F | Wt 168.0 lb

## 2014-08-31 DIAGNOSIS — M5442 Lumbago with sciatica, left side: Secondary | ICD-10-CM

## 2014-08-31 MED ORDER — PREDNISONE 20 MG PO TABS: ORAL_TABLET | ORAL | Status: DC

## 2014-08-31 MED ORDER — HYDROCODONE-ACETAMINOPHEN 5-325 MG PO TABS: 1.0000 | ORAL_TABLET | Freq: Three times a day (TID) | ORAL | Status: DC | PRN

## 2014-08-31 NOTE — Assessment & Plan Note (Signed)
Known h/o bulging disc with some spinal stenosis. Anticipate acute flare of HNP on left. No red flags or neurological sxs today. Will check xrays today and treat with prednisone and vicodin course. Provided with rehab exercises for HNP. Update if not improving with treatment for further eval, consider MRI and referral to spine. Pt/wife agree with plan.

## 2014-08-31 NOTE — Patient Instructions (Signed)
Xray today. Stop etodolac. Take prednisone course for next 10 days. May use vicodin for breakthrough pain but don't mix with cyclobenzaprine. Exercise provided today for when back feeling better. If no improvement with treatment, let us know for further evaluation.

## 2014-08-31 NOTE — Progress Notes (Signed)
BP 140/80 mmHg  Pulse 88  Temp(Src) 98.2 F (36.8 C) (Oral)  Wt 168 lb (76.204 kg)   CC: lower back pain  Subjective:    Patient ID: Aaron Brown., male    DOB: 1946-02-16, 69 y.o.   MRN: [id number]  HPI: Cordai Rodrigue. is a 69 y.o. male presenting on 08/31/2014 for Back Pain   Back pain flare started 08/20/2014 after twisting in bathroom, with acute worsening 3d ago after he over exerted himself. Has been using heating pad, he has restarted lodine and flexeril. Also using back brace. Trouble sleeping 2/2 back pain. Pain localized to L lower back, worse with any twisting motion but overall painful throughout. Throbbing pain "knife in back" with radiation down left anterior leg to the knee.   Denies fevers/chills, numbness or weakness of L leg, bowel or bladder accidents.   H/o bulging disc with spinal stenosis L4/5 s/p surgery x2 (1988, 2000 laminectomies). Last lumbar MRI in our chart is from 1999.   Relevant past medical, surgical, family and social history reviewed and updated as indicated. Interim medical history since our last visit reviewed. Allergies and medications reviewed and updated. Current Outpatient Prescriptions on File Prior to Visit  Medication Sig  . amLODipine (NORVASC) 5 MG tablet Take 1 tablet (5 mg total) by mouth daily.  . beta carotene w/minerals (OCUVITE) tablet Take 1 tablet by mouth daily.   No current facility-administered medications on file prior to visit.   Past Medical History  Diagnosis Date  . Colon polyp 05/2005    ?hyperplastic  . Hypertension   . History of chicken pox   . Blood in stool     in the past; ? hemorrhoids  . Seasonal allergies   . Squamous cell cancer of skin of hand   . Squamous acanthoma of face   . Increased pressure in the eye     Dr. Gloriann Loan  . Lower back pain     bulging disc with spinal stenosis L4/5 s/p surgery x2  . Prediabetes     A1c 6.0 (2012)  . Asymmetrical left sensorineural hearing loss 12/2013     s/p MRI pending hearing aides [name])  . Cerebral microvascular disease 12/2013    by MRI, referred to neuro by ENT    Past Surgical History  Procedure Laterality Date  . Laminectomy  1988    Dr. Pearlie Oyster; L5  . Hemi-laminectomy  2000    Dr. Annette Stable, Dr. Hal Neer L4/L5  . Bunionectomy  2003    Dr. Barkley Bruns  . Squamous cell carcinoma excision  2014    face and hand  . Dobutamine stress echo  2013    normal per prior PCP records  . Colonoscopy  05/2005    1 polyp, int hem    Review of Systems Per HPI unless specifically indicated above     Objective:    BP 140/80 mmHg  Pulse 88  Temp(Src) 98.2 F (36.8 C) (Oral)  Wt 168 lb (76.204 kg)  Wt Readings from Last 3 Encounters:  08/31/14 168 lb (76.204 kg)  12/03/13 167 lb 12 oz (76.091 kg)  11/04/13 169 lb (76.658 kg)    Physical Exam  Constitutional: He appears well-developed and well-nourished. No distress.  Musculoskeletal: He exhibits no edema.  No pain midline spine No paraspinous mm tenderness Neg SLR bilaterally. No pain with int/ext rotation at hip. Neg FABER. No pain at SIJ, GTB or sciatic notch bilaterally.  Neurological: He has normal strength.  No sensory deficit.  Reflex Scores:      Patellar reflexes are 2+ on the right side and 2+ on the left side. 5/5 strength BLE  Nursing note and vitals reviewed.     Assessment & Plan:   Problem List Items Addressed This Visit    Lower back pain - Primary    Known h/o bulging disc with some spinal stenosis. Anticipate acute flare of HNP on left. No red flags or neurological sxs today. Will check xrays today and treat with prednisone and vicodin course. Provided with rehab exercises for HNP. Update if not improving with treatment for further eval, consider MRI and referral to spine. Pt/wife agree with plan.      Relevant Medications   cyclobenzaprine (FLEXERIL) 10 MG tablet   etodolac (LODINE) 400 MG tablet   predniSONE (DELTASONE) tablet    HYDROcodone-acetaminophen (NORCO/VICODIN) 5-325 MG per tablet   Other Relevant Orders   DG Lumbar Spine Complete       Follow up plan: Return if symptoms worsen or fail to improve.

## 2014-08-31 NOTE — Progress Notes (Signed)
Pre visit review using our clinic review tool, if applicable. No additional management support is needed unless otherwise documented below in the visit note. 

## 2014-09-04 ENCOUNTER — Encounter: Payer: Self-pay | Admitting: Family Medicine

## 2014-09-04 ENCOUNTER — Ambulatory Visit (INDEPENDENT_AMBULATORY_CARE_PROVIDER_SITE_OTHER): Payer: Medicare PPO | Admitting: Family Medicine

## 2014-09-04 VITALS — BP 132/74 | HR 84 | Temp 98.2°F | Wt 172.5 lb

## 2014-09-04 DIAGNOSIS — M5442 Lumbago with sciatica, left side: Secondary | ICD-10-CM

## 2014-09-04 MED ORDER — NAPROXEN 500 MG PO TABS: ORAL_TABLET | ORAL | Status: DC

## 2014-09-04 NOTE — Patient Instructions (Addendum)
When done with prednisone course, may start naprosyn as needed for pain, continuing vicodin.  Let me know if not improving as expected or any left leg weakness or worsening numbness/pain despite medicines for MRI and spine clinic referral. Work on exercises and start walking.

## 2014-09-04 NOTE — Progress Notes (Signed)
Pre visit review using our clinic review tool, if applicable. No additional management support is needed unless otherwise documented below in the visit note. 

## 2014-09-04 NOTE — Assessment & Plan Note (Signed)
Slowly improving. Reassured. Discussed red flags to seek care. Discussed if not improving as expected or persistent pain despite meds to point of considering steroid injection to let me know for MRI. Finish prednisone course, then start naprosyn prn. Continue vicodin for breakthrough pain. Advised give more time to see if will resolve on its own. Discussed exercises and walking.

## 2014-09-04 NOTE — Progress Notes (Signed)
BP 132/74 mmHg  Pulse 84  Temp(Src) 98.2 F (36.8 C) (Oral)  Wt 172 lb 8 oz (78.245 kg)   CC: recheck sciatica  Subjective:    Patient ID: Aaron Evangelist., male    DOB: 1946/03/13, 69 y.o.   MRN: [id number]  HPI: Aaron Kiner. is a 69 y.o. male presenting on 09/04/2014 for Follow-up   Seen Monday 08/31/2014 with dx L lower back pain with sciatica. Back pain flare started 08/20/2014 after twisting in bathroom. Xrays showed stable DDD. Treated with prednisone and vicodin courses. Rehab exercises provided for HNP.   LBP has improved some, but notes persistent LLE pain. Describes pressure/tightness down lateral left thigh and anterior L thigh as well as burning pain/numbness. No foot pain. No fevers, bowel/bladder incontinence, saddle anesthesia.  Taking vicodin bid.  H/o bulging disc with spinal stenosis L4/5 s/p surgery x2 (1988, 2000 laminectomies). Last lumbar MRI in our chart is from 1999.   Relevant past medical, surgical, family and social history reviewed and updated as indicated. Interim medical history since our last visit reviewed. Allergies and medications reviewed and updated. Current Outpatient Prescriptions on File Prior to Visit  Medication Sig  . amLODipine (NORVASC) 5 MG tablet Take 1 tablet (5 mg total) by mouth daily.  . beta carotene w/minerals (OCUVITE) tablet Take 1 tablet by mouth daily.  . cyclobenzaprine (FLEXERIL) 10 MG tablet Take 10 mg by mouth 3 (three) times daily as needed for muscle spasms.  Marland Kitchen HYDROcodone-acetaminophen (NORCO/VICODIN) 5-325 MG per tablet Take 1 tablet by mouth every 8 (eight) hours as needed for moderate pain.  . predniSONE (DELTASONE) 20 MG tablet Take three tablets daily for 2 days followed by two tablets daily for 3 days followed by one tablet daily for 4 days   No current facility-administered medications on file prior to visit.    Review of Systems Per HPI unless specifically indicated above     Objective:    BP  132/74 mmHg  Pulse 84  Temp(Src) 98.2 F (36.8 C) (Oral)  Wt 172 lb 8 oz (78.245 kg)  Wt Readings from Last 3 Encounters:  09/04/14 172 lb 8 oz (78.245 kg)  08/31/14 168 lb (76.204 kg)  12/03/13 167 lb 12 oz (76.091 kg)    Physical Exam  Constitutional: He appears well-developed and well-nourished. No distress.  Musculoskeletal: He exhibits no edema.  No pain midline spine No paraspinous mm tenderness Neg SLR bilaterally. No pain with int/ext rotation at hip. Neg FABER. No pain at SIJ, GTB or sciatic notch bilaterally.  Neurological: He has normal strength. No sensory deficit.  5/5 strength BLE  Nursing note and vitals reviewed.   LUMBAR SPINE - COMPLETE 4+ VIEW COMPARISON: None available for review FINDINGS: The lumbar vertebral bodies are preserved in height. There is multilevel moderate disc space narrowing. There is no spondylolisthesis. There is facet joint hypertrophy at L3-4 through L5-S1. The pedicles and transverse processes are intact. The observed portions of the sacrum are normal. IMPRESSION: There is no acute bony abnormality of the lumbar spine. There is moderate multilevel degenerative disc and facet joint change. Electronically Signed  By: Aaron Brown  On: 08/31/2014 10:13     Assessment & Plan:   Problem List Items Addressed This Visit    Lower back pain - Primary    Slowly improving. Reassured. Discussed red flags to seek care. Discussed if not improving as expected or persistent pain despite meds to point of considering steroid injection to let  me know for MRI. Finish prednisone course, then start naprosyn prn. Continue vicodin for breakthrough pain. Advised give more time to see if will resolve on its own. Discussed exercises and walking.      Relevant Medications   naproxen (NAPROSYN) tablet       Follow up plan: Return as needed.

## 2014-09-17 ENCOUNTER — Ambulatory Visit (INDEPENDENT_AMBULATORY_CARE_PROVIDER_SITE_OTHER): Payer: Medicare PPO | Admitting: Family Medicine

## 2014-09-17 ENCOUNTER — Encounter: Payer: Self-pay | Admitting: Family Medicine

## 2014-09-17 VITALS — BP 136/78 | HR 76 | Temp 98.0°F | Wt 166.8 lb

## 2014-09-17 DIAGNOSIS — M5442 Lumbago with sciatica, left side: Secondary | ICD-10-CM

## 2014-09-17 MED ORDER — HYDROCODONE-ACETAMINOPHEN 5-325 MG PO TABS: 1.0000 | ORAL_TABLET | Freq: Three times a day (TID) | ORAL | Status: DC | PRN

## 2014-09-17 NOTE — Patient Instructions (Addendum)
Let's continue naprosyn - up to twice daily with food. May use vicodin for breakthrough pain I will refer you to physical therapy. If not improving with this over next few weeks, call me for referral to spine doctor. Continue walking regularly.

## 2014-09-17 NOTE — Progress Notes (Signed)
Pre visit review using our clinic review tool, if applicable. No additional management support is needed unless otherwise documented below in the visit note. 

## 2014-09-17 NOTE — Progress Notes (Signed)
   BP 136/78 mmHg  Pulse 76  Temp(Src) 98 F (36.7 C) (Oral)  Wt 166 lb 12 oz (75.637 kg)   CC: recheck back pain.  Subjective:    Patient ID: Aaron Evangelist., male    DOB: 09-Feb-1946, 69 y.o.   MRN: [id number]  HPI: Aaron Chaikin. is a 69 y.o. male presenting on 09/17/2014 for Follow-up   sxs started 08/20/2014. Seen 2/29 then 3/4 with L sided back pain with L sciatica. Treated with prednisone course and vicodin. Rehab exercises provided for home use. Finished prednisone and vicodin.  Persistent pressure in front of thigh hand posterior, as well as lateral hip discomfort "burning pressure".   No rashes. No fevers/chills. No numbness. No significant weakness.   Able to continue walking daily 1/2 - 1 mile. Finds if he overexerts himself feels worse pain the next day.  H/o bulging disc with spinal stenosis L4/5 s/p surgery x2 (1988, 2000 laminectomies). Last lumbar MRI in our chart is from 1999.   Relevant past medical, surgical, family and social history reviewed and updated as indicated. Interim medical history since our last visit reviewed. Allergies and medications reviewed and updated. Current Outpatient Prescriptions on File Prior to Visit  Medication Sig  . amLODipine (NORVASC) 5 MG tablet Take 1 tablet (5 mg total) by mouth daily.  . beta carotene w/minerals (OCUVITE) tablet Take 1 tablet by mouth daily.  . naproxen (NAPROSYN) 500 MG tablet Take one po bid x 5 days then prn pain, take with food  . cyclobenzaprine (FLEXERIL) 10 MG tablet Take 10 mg by mouth 3 (three) times daily as needed for muscle spasms.   No current facility-administered medications on file prior to visit.    Review of Systems Per HPI unless specifically indicated above     Objective:    BP 136/78 mmHg  Pulse 76  Temp(Src) 98 F (36.7 C) (Oral)  Wt 166 lb 12 oz (75.637 kg)  Wt Readings from Last 3 Encounters:  09/17/14 166 lb 12 oz (75.637 kg)  09/04/14 172 lb 8 oz (78.245 kg)    08/31/14 168 lb (76.204 kg)    Physical Exam  Constitutional: He appears well-developed and well-nourished. No distress.  Musculoskeletal: He exhibits no edema.  No pain midline spine No paraspinous mm tenderness Neg SLR bilaterally. No pain with int/ext rotation at hip. Neg FABER. No pain at SIJ, GTB or sciatic notch bilaterally.  Neurological: He has normal strength. No sensory deficit.  5/5 strength BLE  Nursing note and vitals reviewed.      Assessment & Plan:   Problem List Items Addressed This Visit    Lower back pain - Primary    Anticipate sciatica. Persistent pain. Continue naprosyn, vicodin for breakthrough pain. Refer to PT. Update if not better with this for referral to spine clinic.      Relevant Medications   HYDROcodone-acetaminophen (NORCO/VICODIN) 5-325 MG per tablet   Other Relevant Orders   Ambulatory referral to Physical Therapy       Follow up plan: Return in about 3 months (around 12/18/2014).

## 2014-09-17 NOTE — Assessment & Plan Note (Signed)
Anticipate sciatica. Persistent pain. Continue naprosyn, vicodin for breakthrough pain. Refer to PT. Update if not better with this for referral to spine clinic.

## 2014-10-09 ENCOUNTER — Other Ambulatory Visit: Payer: Self-pay | Admitting: Family Medicine

## 2014-10-19 ENCOUNTER — Ambulatory Visit (INDEPENDENT_AMBULATORY_CARE_PROVIDER_SITE_OTHER): Payer: Medicare PPO | Admitting: Family Medicine

## 2014-10-19 ENCOUNTER — Encounter: Payer: Self-pay | Admitting: Family Medicine

## 2014-10-19 VITALS — BP 118/74 | HR 72 | Temp 97.8°F | Wt 167.0 lb

## 2014-10-19 DIAGNOSIS — R7309 Other abnormal glucose: Secondary | ICD-10-CM

## 2014-10-19 DIAGNOSIS — M5442 Lumbago with sciatica, left side: Secondary | ICD-10-CM

## 2014-10-19 DIAGNOSIS — R7303 Prediabetes: Secondary | ICD-10-CM

## 2014-10-19 LAB — BASIC METABOLIC PANEL
BUN: 13 mg/dL (ref 6–23)
CHLORIDE: 103 meq/L (ref 96–112)
CO2: 28 mEq/L (ref 19–32)
Calcium: 9.5 mg/dL (ref 8.4–10.5)
Creatinine, Ser: 1.08 mg/dL (ref 0.40–1.50)
GFR: 72.44 mL/min (ref >60.00)
Glucose, Bld: 102 mg/dL — ABNORMAL HIGH (ref 70–99)
POTASSIUM: 5.2 meq/L — AB (ref 3.5–5.1)
SODIUM: 136 meq/L (ref 135–145)

## 2014-10-19 LAB — HEMOGLOBIN A1C: Hgb A1c MFr Bld: 5.9 % (ref 4.6–6.5)

## 2014-10-19 MED ORDER — CYCLOBENZAPRINE HCL 10 MG PO TABS: 10.0000 mg | ORAL_TABLET | Freq: Three times a day (TID) | ORAL | Status: DC | PRN

## 2014-10-19 NOTE — Progress Notes (Signed)
Pre visit review using our clinic review tool, if applicable. No additional management support is needed unless otherwise documented below in the visit note. 

## 2014-10-19 NOTE — Progress Notes (Signed)
BP 118/74 mmHg  Pulse 72  Temp(Src) 97.8 F (36.6 C) (Oral)  Wt 167 lb (75.751 kg)   CC: sugar check   Subjective:    Patient ID: Aaron Brown., male    DOB: [birth year], 69 y.o.   MRN: [id number]  HPI: Gerhard Rappaport. is a 69 y.o. male presenting on 10/19/2014 for Check sugar   Saw eye doctor recently, advised to have his sugars checked. Had 50% reduction in nearsightedness.   Prediabetes - since 2012. Last year A1c 6.0 % - since then we've been decreasing sugar intake.   Back pain - presumed L radiculopathy with sciatica in known bulging discs in spine. S/p PT and sxs improved, then may have re injured doing yardwork.   Relevant past medical, surgical, family and social history reviewed and updated as indicated. Interim medical history since our last visit reviewed. Allergies and medications reviewed and updated. Current Outpatient Prescriptions on File Prior to Visit  Medication Sig  . amLODipine (NORVASC) 5 MG tablet Take 1 tablet (5 mg total) by mouth daily.  . beta carotene w/minerals (OCUVITE) tablet Take 1 tablet by mouth daily.  Marland Kitchen HYDROcodone-acetaminophen (NORCO/VICODIN) 5-325 MG per tablet Take 1 tablet by mouth every 8 (eight) hours as needed for moderate pain.  . naproxen (NAPROSYN) 500 MG tablet Take twice daily as needed with food   No current facility-administered medications on file prior to visit.    Review of Systems Per HPI unless specifically indicated above     Objective:    BP 118/74 mmHg  Pulse 72  Temp(Src) 97.8 F (36.6 C) (Oral)  Wt 167 lb (75.751 kg)  Wt Readings from Last 3 Encounters:  10/19/14 167 lb (75.751 kg)  09/17/14 166 lb 12 oz (75.637 kg)  09/04/14 172 lb 8 oz (78.245 kg)    Physical Exam  Constitutional: He appears well-developed and well-nourished. No distress.  Psychiatric: He has a normal mood and affect.  Nursing note and vitals reviewed.  Results for orders placed or performed in visit on 12/03/13  Lipid  panel  Result Value Ref Range   Cholesterol 159 0 - 200 mg/dL   Triglycerides 89.0 0.0 - 149.0 mg/dL   HDL 42.40 >39.00 mg/dL   VLDL 17.8 0.0 - 40.0 mg/dL   LDL Cholesterol 99 0 - 99 mg/dL   Total CHOL/HDL Ratio 4    NonHDL 116.60   Hemoglobin A1c  Result Value Ref Range   Hgb A1c MFr Bld 6.0 4.6 - 6.5 %  Basic metabolic panel  Result Value Ref Range   Sodium 137 135 - 145 mEq/L   Potassium 4.9 3.5 - 5.1 mEq/L   Chloride 103 96 - 112 mEq/L   CO2 30 19 - 32 mEq/L   Glucose, Bld 95 70 - 99 mg/dL   BUN 18 6 - 23 mg/dL   Creatinine, Ser 1.1 0.4 - 1.5 mg/dL   Calcium 9.3 8.4 - 10.5 mg/dL   GFR 72.63 >60.00 mL/min  PSA, Medicare  Result Value Ref Range   PSA 2.42 0.10 - 4.00 ng/ml      Assessment & Plan:   Problem List Items Addressed This Visit    Prediabetes - Primary    Recheck today. Discussed dietary changes to keep sugar under control      Relevant Orders   Hemoglobin O0B   Basic metabolic panel   Lower back pain    Improved with PT, has to limit activity  Relevant Medications   aspirin 81 MG tablet   cyclobenzaprine (FLEXERIL) 10 MG tablet       Follow up plan: Return if symptoms worsen or fail to improve.

## 2014-10-19 NOTE — Assessment & Plan Note (Signed)
Recheck today. Discussed dietary changes to keep sugar under control

## 2014-10-19 NOTE — Assessment & Plan Note (Signed)
Improved with PT, has to limit activity

## 2014-10-19 NOTE — Patient Instructions (Addendum)
Labwork today to recheck prediabetes.

## 2014-10-20 ENCOUNTER — Telehealth: Payer: Self-pay

## 2014-10-20 DIAGNOSIS — E875 Hyperkalemia: Secondary | ICD-10-CM

## 2014-10-20 NOTE — Telephone Encounter (Signed)
Pt left v/m requesting call with 10/19/14 lab results when available.

## 2014-10-20 NOTE — Telephone Encounter (Signed)
See results note. 

## 2014-10-21 ENCOUNTER — Other Ambulatory Visit: Payer: Self-pay | Admitting: Family Medicine

## 2014-10-21 DIAGNOSIS — M5442 Lumbago with sciatica, left side: Secondary | ICD-10-CM

## 2014-10-21 NOTE — Telephone Encounter (Signed)
Please call patient with lab results.  Please call patient at [phone].

## 2014-10-21 NOTE — Telephone Encounter (Signed)
Spoke with patient.

## 2014-10-26 ENCOUNTER — Other Ambulatory Visit (INDEPENDENT_AMBULATORY_CARE_PROVIDER_SITE_OTHER): Payer: Medicare PPO

## 2014-10-26 ENCOUNTER — Ambulatory Visit: Payer: Medicare PPO | Admitting: Family Medicine

## 2014-10-26 ENCOUNTER — Telehealth: Payer: Self-pay | Admitting: Family Medicine

## 2014-10-26 DIAGNOSIS — E875 Hyperkalemia: Secondary | ICD-10-CM | POA: Diagnosis not present

## 2014-10-26 LAB — POTASSIUM: Potassium: 5.2 mEq/L — ABNORMAL HIGH (ref 3.5–5.1)

## 2014-10-26 NOTE — Telephone Encounter (Signed)
Patient notified and letter placed up front for pick up. 

## 2014-10-26 NOTE — Telephone Encounter (Signed)
In your IN box for review.  

## 2014-10-26 NOTE — Telephone Encounter (Signed)
Pt needs a letter about his illness./ injury to avoid a charge for a vacation trip.  He has given [place] a sample letter.  Placing sample letter on Kim's desk/  Please call [phone] when ready to be picked up. Thanks.

## 2014-10-26 NOTE — Telephone Encounter (Signed)
Letter written and in Kim's box.

## 2014-11-02 ENCOUNTER — Ambulatory Visit
Admission: RE | Admit: 2014-11-02 | Discharge: 2014-11-02 | Disposition: A | Discharge status: Home / Self Care | Payer: Medicare PPO | Source: Ambulatory Visit | Attending: Family Medicine | Admitting: Family Medicine

## 2014-11-02 DIAGNOSIS — M5386 Other specified dorsopathies, lumbar region: Secondary | ICD-10-CM | POA: Diagnosis not present

## 2014-11-02 DIAGNOSIS — M5416 Radiculopathy, lumbar region: Secondary | ICD-10-CM | POA: Diagnosis present

## 2014-11-02 DIAGNOSIS — M5126 Other intervertebral disc displacement, lumbar region: Secondary | ICD-10-CM | POA: Insufficient documentation

## 2014-11-02 DIAGNOSIS — M4806 Spinal stenosis, lumbar region: Secondary | ICD-10-CM | POA: Diagnosis not present

## 2014-11-02 DIAGNOSIS — M545 Low back pain: Secondary | ICD-10-CM | POA: Diagnosis present

## 2014-11-02 DIAGNOSIS — M5442 Lumbago with sciatica, left side: Secondary | ICD-10-CM

## 2014-11-03 ENCOUNTER — Telehealth: Payer: Self-pay | Admitting: Family Medicine

## 2014-11-03 NOTE — Telephone Encounter (Signed)
Noted. pt has had referral done.

## 2014-11-03 NOTE — Telephone Encounter (Signed)
Pt called, completed MRI at [facility] yesterday and he has a copy of results and disc.  He would like to be referred to Dr. Deri Fuelling at [facility].  Best number to call pt is [phone] / lt

## 2014-11-25 ENCOUNTER — Other Ambulatory Visit: Payer: Self-pay | Admitting: Family Medicine

## 2014-12-07 ENCOUNTER — Encounter: Payer: Commercial Managed Care - HMO | Admitting: Family Medicine

## 2014-12-14 ENCOUNTER — Encounter: Payer: Self-pay | Admitting: Family Medicine

## 2014-12-14 ENCOUNTER — Ambulatory Visit (INDEPENDENT_AMBULATORY_CARE_PROVIDER_SITE_OTHER): Payer: Medicare PPO | Admitting: Family Medicine

## 2014-12-14 VITALS — BP 130/80 | HR 64 | Temp 97.9°F | Ht 68.0 in | Wt 163.8 lb

## 2014-12-14 DIAGNOSIS — Z0001 Encounter for general adult medical examination with abnormal findings: Secondary | ICD-10-CM | POA: Insufficient documentation

## 2014-12-14 DIAGNOSIS — R7303 Prediabetes: Secondary | ICD-10-CM

## 2014-12-14 DIAGNOSIS — Z125 Encounter for screening for malignant neoplasm of prostate: Secondary | ICD-10-CM

## 2014-12-14 DIAGNOSIS — R7309 Other abnormal glucose: Secondary | ICD-10-CM

## 2014-12-14 DIAGNOSIS — H9042 Sensorineural hearing loss, unilateral, left ear, with unrestricted hearing on the contralateral side: Secondary | ICD-10-CM

## 2014-12-14 DIAGNOSIS — IMO0001 Reserved for inherently not codable concepts without codable children: Secondary | ICD-10-CM

## 2014-12-14 DIAGNOSIS — Z0000 Encounter for general adult medical examination without abnormal findings: Secondary | ICD-10-CM | POA: Insufficient documentation

## 2014-12-14 DIAGNOSIS — I10 Essential (primary) hypertension: Secondary | ICD-10-CM

## 2014-12-14 DIAGNOSIS — M5442 Lumbago with sciatica, left side: Secondary | ICD-10-CM

## 2014-12-14 DIAGNOSIS — Z7189 Other specified counseling: Secondary | ICD-10-CM | POA: Insufficient documentation

## 2014-12-14 LAB — COMPREHENSIVE METABOLIC PANEL
ALBUMIN: 4.5 g/dL (ref 3.5–5.2)
ALT: 19 U/L (ref 0–53)
AST: 29 U/L (ref 0–37)
Alkaline Phosphatase: 71 U/L (ref 39–117)
BILIRUBIN TOTAL: 0.5 mg/dL (ref 0.2–1.2)
BUN: 14 mg/dL (ref 6–23)
CO2: 28 meq/L (ref 19–32)
Calcium: 9.6 mg/dL (ref 8.4–10.5)
Chloride: 102 mEq/L (ref 96–112)
Creatinine, Ser: 1.03 mg/dL (ref 0.40–1.50)
GFR: 76.48 mL/min (ref >60.00)
Glucose, Bld: 102 mg/dL — ABNORMAL HIGH (ref 70–99)
Potassium: 4.8 mEq/L (ref 3.5–5.1)
Sodium: 137 mEq/L (ref 135–145)
Total Protein: 7.5 g/dL (ref 6.0–8.3)

## 2014-12-14 LAB — LIPID PANEL
CHOL/HDL RATIO: 3
Cholesterol: 134 mg/dL (ref 0–200)
HDL: 49.3 mg/dL (ref >39.00)
LDL CALC: 64 mg/dL (ref 0–99)
NONHDL: 84.7
Triglycerides: 103 mg/dL (ref 0.0–149.0)
VLDL: 20.6 mg/dL (ref 0.0–40.0)

## 2014-12-14 LAB — PSA, MEDICARE: PSA: 2.05 ng/ml (ref 0.10–4.00)

## 2014-12-14 NOTE — Assessment & Plan Note (Signed)
Preventative protocols reviewed and updated unless pt declined. Discussed healthy diet and lifestyle.  

## 2014-12-14 NOTE — Progress Notes (Signed)
BP 130/80 mmHg  Pulse 64  Temp(Src) 97.9 F (36.6 C) (Oral)  Ht 5\' 8"  (1.727 m)  Wt 163 lb 12 oz (74.277 kg)  BMI 24.90 kg/m2   CC: medicare wellness visit  Subjective:    Patient ID: Aaron Evangelist., male    DOB: 02-04-1946, 69 y.o.   MRN: [id number]  HPI: Aaron Morissette. is a 69 y.o. male presenting on 12/14/2014 for Annual Exam   Lower back has improved on its own.  Oatmeal this morning at 6am.  Failed hearing screen - requests referral to audiologist. Vision screen - last saw eye doctor 10/2014. No falls in last year Denies depression/anhedonia.  Preventative: COLONOSCOPY Date: 05/2005 1 polyp, int hem, told ok to rpt 10 yrs ([facility]). Requests rpt colonoscopy next year. Declines iFOB today. Prostate screening - desired yearly. Flu shot - declines  Pneumovax 07/2011, prevnar 12/2013 Tdap 12/2011  Zostavax 2013  Advanced directive: has not set up but planning to. HCPOA would be wife then daughter. Packet provided today. Seat belt use discussed Sunscreen use discussed - no changing moles. Sees dermatologist regularly.  Lives with wife  Grown children  Occupation: was Government social research officer, now owns landscaping business  Edu: Sports administrator  Activity: stays active at work, walking at home Diet: good water, fruits/vegetables some   Relevant past medical, surgical, family and social history reviewed and updated as indicated. Interim medical history since our last visit reviewed. Allergies and medications reviewed and updated. Current Outpatient Prescriptions on File Prior to Visit  Medication Sig  . amLODipine (NORVASC) 5 MG tablet TAKE 1 TABLET (5 MG TOTAL) BY MOUTH DAILY.  Marland Kitchen aspirin 81 MG tablet Take 81 mg by mouth daily.  . beta carotene w/minerals (OCUVITE) tablet Take 1 tablet by mouth daily.  . cholecalciferol (VITAMIN D) 1000 UNITS tablet Take 1,000 Units by mouth daily.  . cyclobenzaprine (FLEXERIL) 10 MG tablet Take 1 tablet (10 mg total) by mouth 3  (three) times daily as needed for muscle spasms. (Patient not taking: Reported on 12/14/2014)  . HYDROcodone-acetaminophen (NORCO/VICODIN) 5-325 MG per tablet Take 1 tablet by mouth every 8 (eight) hours as needed for moderate pain. (Patient not taking: Reported on 12/14/2014)  . naproxen (NAPROSYN) 500 MG tablet Take twice daily as needed with food (Patient not taking: Reported on 12/14/2014)   No current facility-administered medications on file prior to visit.    Review of Systems  Constitutional: Negative for fever, chills, activity change, appetite change, fatigue and unexpected weight change.  HENT: Negative for hearing loss.   Eyes: Negative for visual disturbance.  Respiratory: Negative for cough, chest tightness, shortness of breath and wheezing.   Cardiovascular: Negative for chest pain, palpitations and leg swelling.  Gastrointestinal: Negative for nausea, vomiting, abdominal pain, diarrhea, constipation, blood in stool and abdominal distention.  Genitourinary: Negative for hematuria and difficulty urinating.  Musculoskeletal: Negative for myalgias, arthralgias and neck pain.  Skin: Negative for rash.  Neurological: Negative for dizziness, seizures, syncope and headaches.  Hematological: Negative for adenopathy. Does not bruise/bleed easily.  Psychiatric/Behavioral: Negative for dysphoric mood. The patient is not nervous/anxious.    Per HPI unless specifically indicated above     Objective:    BP 130/80 mmHg  Pulse 64  Temp(Src) 97.9 F (36.6 C) (Oral)  Ht 5\' 8"  (1.727 m)  Wt 163 lb 12 oz (74.277 kg)  BMI 24.90 kg/m2  Wt Readings from Last 3 Encounters:  12/14/14 163 lb 12 oz (74.277 kg)  11/02/14 170 lb (77.111 kg)  10/19/14 167 lb (75.751 kg)    Physical Exam  Constitutional: He is oriented to person, place, and time. He appears well-developed and well-nourished. No distress.  HENT:  Head: Normocephalic and atraumatic.  Right Ear: Hearing, tympanic membrane,  external ear and ear canal normal.  Left Ear: Hearing, tympanic membrane, external ear and ear canal normal.  Nose: Nose normal.  Mouth/Throat: Uvula is midline, oropharynx is clear and moist and mucous membranes are normal. No oropharyngeal exudate, posterior oropharyngeal edema or posterior oropharyngeal erythema.  Eyes: Conjunctivae and EOM are normal. Pupils are equal, round, and reactive to light. No scleral icterus.  Neck: Normal range of motion. Neck supple. Carotid bruit is not present. No thyromegaly present.  Cardiovascular: Normal rate, regular rhythm, normal heart sounds and intact distal pulses.   No murmur heard. Pulses:      Radial pulses are 2+ on the right side, and 2+ on the left side.  Pulmonary/Chest: Effort normal and breath sounds normal. No respiratory distress. He has no wheezes. He has no rales.  Abdominal: Soft. Bowel sounds are normal. He exhibits no distension and no mass. There is no tenderness. There is no rebound and no guarding.  Genitourinary: Prostate normal. Rectal exam shows no external hemorrhoid, no internal hemorrhoid, no fissure, no mass, no tenderness and anal tone normal. Prostate is not enlarged (20gm) and not tender.  Musculoskeletal: Normal range of motion. He exhibits no edema.  Lymphadenopathy:    He has no cervical adenopathy.  Neurological: He is alert and oriented to person, place, and time.  CN grossly intact, station and gait intact Recall 3/3 Calculation 5/5 serial 7s  Skin: Skin is warm and dry. No rash noted.  Psychiatric: He has a normal mood and affect. His behavior is normal. Judgment and thought content normal.  Nursing note and vitals reviewed.  Results for orders placed or performed in visit on 10/26/14  Potassium  Result Value Ref Range   Potassium 5.2 (H) 3.5 - 5.1 mEq/L      Assessment & Plan:   Problem List Items Addressed This Visit    Advanced care planning/counseling discussion    Advanced directive: has not set up  but planning to. HCPOA would be wife then daughter. Packet provided today.      Asymmetrical left sensorineural hearing loss    We will place referral to audiologist today.      Health maintenance examination    Preventative protocols reviewed and updated unless pt declined. Discussed healthy diet and lifestyle.       Hypertension    Chronic, stable. Continue amlodipine.      Relevant Orders   Lipid panel   Comprehensive metabolic panel   Lower back pain    Improved. Saw Dr Trenton Gammon most recently.      Medicare annual wellness visit, subsequent - Primary    I have personally reviewed the Medicare Annual Wellness questionnaire and have noted 1. The patient's medical and social history 2. Their use of alcohol, tobacco or illicit drugs 3. Their current medications and supplements 4. The patient's functional ability including ADL's, fall risks, home safety risks and hearing or visual impairment. 5. Diet and physical activity 6. Evidence for depression or mood disorders The patients weight, height, BMI have been recorded in the chart.  Hearing and vision has been addressed. I have made referrals, counseling and provided education to the patient based review of the above and I have provided the pt with  a written personalized care plan for preventive services. Provider list updated - see scanned questionairre. Reviewed preventative protocols and updated unless pt declined.       Prediabetes    Check labwork today.      Relevant Orders   Lipid panel   Comprehensive metabolic panel    Other Visit Diagnoses    Special screening for malignant neoplasm of prostate        Relevant Orders    PSA, Medicare        Follow up plan: Return in about 1 year (around 12/14/2015), or as needed, for medicare wellness visit.

## 2014-12-14 NOTE — Assessment & Plan Note (Signed)

## 2014-12-14 NOTE — Patient Instructions (Addendum)
We will refer you to audiologist. labwork today. Advanced directive packet today. Return as needed or in 1 year for medicare wellness visit. Good to see you today, call us with questions. You are doing well today.

## 2014-12-14 NOTE — Assessment & Plan Note (Signed)
Advanced directive: has not set up but planning to. HCPOA would be wife then daughter. Packet provided today.

## 2014-12-14 NOTE — Assessment & Plan Note (Signed)
Chronic, stable. Continue amlodipine.  

## 2014-12-14 NOTE — Assessment & Plan Note (Signed)
Improved. Saw Dr Trenton Gammon most recently.

## 2014-12-14 NOTE — Assessment & Plan Note (Signed)
Check lab work today

## 2014-12-14 NOTE — Addendum Note (Signed)
Addended by: Ria Bush on: 12/14/2014 11:30 AM   Modules accepted: Orders, SmartSet

## 2014-12-14 NOTE — Assessment & Plan Note (Signed)
We will place referral to audiologist today.

## 2014-12-14 NOTE — Progress Notes (Signed)
Pre visit review using our clinic review tool, if applicable. No additional management support is needed unless otherwise documented below in the visit note. 

## 2014-12-15 ENCOUNTER — Encounter: Payer: Self-pay | Admitting: *Deleted

## 2015-01-02 ENCOUNTER — Emergency Department (HOSPITAL_COMMUNITY)
Admission: EM | Admit: 2015-01-02 | Discharge: 2015-01-02 | Disposition: A | Discharge status: Home / Self Care | Payer: Medicare PPO | Attending: Emergency Medicine | Admitting: Emergency Medicine

## 2015-01-02 ENCOUNTER — Encounter (HOSPITAL_COMMUNITY): Payer: Self-pay | Admitting: *Deleted

## 2015-01-02 DIAGNOSIS — Z8619 Personal history of other infectious and parasitic diseases: Secondary | ICD-10-CM | POA: Insufficient documentation

## 2015-01-02 DIAGNOSIS — G8929 Other chronic pain: Secondary | ICD-10-CM | POA: Insufficient documentation

## 2015-01-02 DIAGNOSIS — Z79899 Other long term (current) drug therapy: Secondary | ICD-10-CM | POA: Diagnosis not present

## 2015-01-02 DIAGNOSIS — Z7982 Long term (current) use of aspirin: Secondary | ICD-10-CM | POA: Diagnosis not present

## 2015-01-02 DIAGNOSIS — M62830 Muscle spasm of back: Secondary | ICD-10-CM | POA: Insufficient documentation

## 2015-01-02 DIAGNOSIS — M545 Low back pain, unspecified: Secondary | ICD-10-CM

## 2015-01-02 DIAGNOSIS — Z86010 Personal history of colonic polyps: Secondary | ICD-10-CM | POA: Insufficient documentation

## 2015-01-02 DIAGNOSIS — M25552 Pain in left hip: Secondary | ICD-10-CM | POA: Diagnosis present

## 2015-01-02 DIAGNOSIS — Z85828 Personal history of other malignant neoplasm of skin: Secondary | ICD-10-CM | POA: Diagnosis not present

## 2015-01-02 DIAGNOSIS — I10 Essential (primary) hypertension: Secondary | ICD-10-CM | POA: Diagnosis not present

## 2015-01-02 MED ORDER — ETODOLAC 400 MG PO TABS: 400.0000 mg | ORAL_TABLET | Freq: Two times a day (BID) | ORAL | Status: DC

## 2015-01-02 NOTE — ED Notes (Signed)
Pt states he injured his back in Feb.  Since then he has been declared "healed" by Dr Trenton Gammon.  However, yesterday he overexerted himself and today he fell after having a spasm in his L thigh.  Pt did take pain meds this am with minimal relief.

## 2015-01-02 NOTE — ED Provider Notes (Signed)
CSN: [id number]     Arrival date & time 01/02/15  1010 History  This chart was scribed for [employer], Aaron Brown, working with Fredia Sorrow, Aaron Brown by Aaron Brown, ED Scribe. The patient was seen in room TR09C/TR09C at 11:02 AM.    Chief Complaint  Patient presents with  . Hip Pain  . Back Pain      Patient is a 69 y.o. male presenting with back pain. The history is provided by the patient. No language interpreter was used.  Back Pain Location:  Lumbar spine (left sided) Quality: dull throbbing. Radiates to:  Does not radiate Pain severity:  Mild Pain is:  Same all the time Onset quality:  Gradual Duration:  3 days Timing:  Constant Progression:  Worsening Chronicity:  New Context: lifting heavy objects, physical stress and twisting   Relieved by:  Muscle relaxants and NSAIDs Worsened by:  Ambulation and movement Ineffective treatments:  None tried Associated symptoms: no abdominal pain, no bladder incontinence, no bowel incontinence, no chest pain, no dysuria, no fever, no headaches, no numbness, no paresthesias, no perianal numbness, no tingling and no weakness   Risk factors: no hx of cancer and no recent surgery     Aaron Brown. is a 69 y.o. male with a PMHx of spinal stenosis s/p laminectomy, HTN, and chronic low back pain, who presents to the Emergency department complaining of left lower back pain onset 3 days ago in the afternoon. Pt reports that his symptoms gradually began following overexertion during yard work. Pt pain is 5-6/10, constant, dull, throbbing, and it does not radiate. He reports that movement worsens his pain. He notes that he has tried cyclobenzaprine and etodolac with relief for his symptoms. Pt reports that he injured his back in February 2016 and has been deemed healed by Aaron Brown who did his laminectomy procedure. Pt notes that he overexerted himself yesterday and has had a spasm in his left thigh.  He denies fever, chills, weakness, HA,  blurred vision, CP, SOB, abdominal pain, n/v/d/c, hematuria, dysuria, bowel/bladder incontinence, numbness, tingling, and any other symptoms. Pt denies hx of CA or IV drug use. Overall his symptoms have improved upon arrival to ER.  Past Medical History  Diagnosis Date  . Colon polyp 05/2005    ?hyperplastic  . Hypertension   . History of chicken pox   . Blood in stool     in the past; ? hemorrhoids  . Seasonal allergies   . Squamous cell cancer of skin of hand   . Squamous acanthoma of face   . Increased pressure in the eye     Aaron Brown  . Lower back pain     bulging disc with spinal stenosis L4/5 s/p surgery x2  . Prediabetes     A1c 6.0 (2012)  . Asymmetrical left sensorineural hearing loss 12/2013    s/p MRI pending hearing aides Aaron Brown)  . Cerebral microvascular disease 12/2013    by MRI, referred to neuro by ENT   Past Surgical History  Procedure Laterality Date  . Laminectomy  1988    Aaron Brown; L5  . Hemi-laminectomy  2000    Aaron Brown, Aaron Brown L4/L5  . Bunionectomy  2003    Aaron Brown  . Squamous cell carcinoma excision  2014    face and hand  . Dobutamine stress echo  2013    normal per prior PCP records  . Colonoscopy  05/2005    1 polyp, int hem  Family History  Problem Relation Age of Onset  . Hypertension Mother   . Arthritis Father   . Hypertension Father   . Alcohol abuse Maternal Grandfather   . Cancer Maternal Aunt     colon  . Cancer Cousin     colon  . Cancer Cousin     breast   History  Substance Use Topics  . Smoking status: Never Smoker   . Smokeless tobacco: Never Used  . Alcohol Use: Yes     Comment: Occasional-wine    Review of Systems  Constitutional: Negative for fever and chills.  Eyes: Negative for visual disturbance.  Respiratory: Negative for shortness of breath.   Cardiovascular: Negative for chest pain.  Gastrointestinal: Negative for nausea, vomiting, abdominal pain, diarrhea, constipation and bowel  incontinence.       No bowel incontinence  Genitourinary: Negative for bladder incontinence, dysuria and hematuria.       No bladder incontinence  Musculoskeletal: Positive for myalgias and back pain.  Skin: Negative for color change.  Allergic/Immunologic: Negative for immunocompromised state.  Neurological: Negative for tingling, weakness, numbness, headaches and paresthesias.   A complete 10 system review of systems was obtained and all systems are negative except as noted in the HPI and PMH.     Allergies  Review of patient's allergies indicates no known allergies.  Home Medications   Prior to Admission medications   Medication Sig Start Date End Date Taking? Authorizing Provider  amLODipine (NORVASC) 5 MG tablet TAKE 1 TABLET (5 MG TOTAL) BY MOUTH DAILY. 11/25/14   Ria Bush, Aaron Brown  aspirin 81 MG tablet Take 81 mg by mouth daily.    Historical Provider, Aaron Brown  beta carotene w/minerals (OCUVITE) tablet Take 1 tablet by mouth daily.    Historical Provider, Aaron Brown  cholecalciferol (VITAMIN D) 1000 UNITS tablet Take 1,000 Units by mouth daily.    Historical Provider, Aaron Brown  cyclobenzaprine (FLEXERIL) 10 MG tablet Take 1 tablet (10 mg total) by mouth 3 (three) times daily as needed for muscle spasms. Patient not taking: Reported on 12/14/2014 10/19/14   Ria Bush, Aaron Brown  HYDROcodone-acetaminophen (NORCO/VICODIN) 5-325 MG per tablet Take 1 tablet by mouth every 8 (eight) hours as needed for moderate pain. Patient not taking: Reported on 12/14/2014 09/17/14   Ria Bush, Aaron Brown  naproxen (NAPROSYN) 500 MG tablet Take twice daily as needed with food Patient not taking: Reported on 12/14/2014 10/09/14   Ria Bush, Aaron Brown   BP 152/89 mmHg  Pulse 82  Temp(Src) 97.8 F (36.6 C) (Oral)  Resp 20  Wt 163 lb (73.936 kg)  SpO2 100% Physical Exam  Constitutional: He is oriented to person, place, and time. Vital signs are normal. He appears well-developed and well-nourished.  Non-toxic  appearance. No distress.  Afebrile, nontoxic, NAD  HENT:  Head: Normocephalic and atraumatic.  Mouth/Throat: Mucous membranes are normal.  Eyes: Conjunctivae and EOM are normal. Right eye exhibits no discharge. Left eye exhibits no discharge.  Neck: Normal range of motion. Neck supple.  Cardiovascular: Normal rate and intact distal pulses.   Pulmonary/Chest: Effort normal. No respiratory distress.  Abdominal: Normal appearance. He exhibits no distension.  Musculoskeletal: Normal range of motion.       Lumbar back: He exhibits spasm. He exhibits normal range of motion, no tenderness, no bony tenderness and no deformity.  lumbar spine with FROM intact without spinous process TTP, no bony stepoffs or deformities, no paraspinous muscle TTP but with mild palpable left sided paraspinous muscle spasms. Strength 5/5  in all extremities, sensation grossly intact in all extremities, negative SLR bilaterally, gait steady and nonantalgic. No overlying skin changes.   Neurological: He is alert and oriented to person, place, and time. He has normal strength. No sensory deficit. Gait normal.  Skin: Skin is warm, dry and intact. No rash noted.  Psychiatric: He has a normal mood and affect.  Nursing note and vitals reviewed.   ED Course  Procedures (including critical care time) DIAGNOSTIC STUDIES: Oxygen Saturation is 100% on RA, nl by my interpretation.    COORDINATION OF CARE: 11:12 AM-Discussed treatment plan which includes continue anti-inflammatory and muscle relaxer, began with warm compress with pt at bedside and pt agreed to plan.   Labs Review Labs Reviewed - No data to display  Imaging Review No results found.   EKG Interpretation None      MDM   Final diagnoses:  Left-sided low back pain without sciatica  Back muscle spasm    69 y.o. male here with back pain. No red flag s/s of low back pain. No s/s of central cord compression or cauda equina. Lower extremities are  neurovascularly intact and patient is ambulating without difficulty. Recent MRI showing multiple bulging discs. Doubt need for emergent imaging.  Patient was counseled on back pain precautions and told to do activity as tolerated but do not lift, push, or pull heavy objects more than 10 pounds for the next week. Patient counseled to use ice or heat on back for no longer than 15 minutes every hour.   Pt has rx for flexeril and hydrocodone. Will refill his etodolac.   Patient urged to follow-up with his neurosurgeon if pain does not improve with treatment and rest or if pain becomes recurrent. Urged to return with worsening severe pain, loss of bowel or bladder control, trouble walking. The patient verbalizes understanding and agrees with the plan.   I personally performed the services described in this documentation, which was scribed in my presence. The recorded information has been reviewed and is accurate.  BP 152/89 mmHg  Pulse 82  Temp(Src) 97.8 F (36.6 C) (Oral)  Resp 20  Wt 163 lb (73.936 kg)  SpO2 100%  Meds ordered this encounter  Medications  . etodolac (LODINE) 400 MG tablet    Sig: Take 1 tablet (400 mg total) by mouth 2 (two) times daily after a meal.    Dispense:  30 tablet    Refill:  3    Order Specific Question:  Supervising Provider    Answer:  Aaron Reichmann Camprubi-Soms, Aaron Brown 01/02/15 1130  Fredia Sorrow, Aaron Brown 01/02/15 1643

## 2015-01-02 NOTE — ED Notes (Signed)
Declined W/C at D/C and was escorted to lobby by RN. 

## 2015-01-02 NOTE — Discharge Instructions (Signed)
Back Pain:  Your back pain should be treated with medicines such as ibuprofen or aleve and this back pain should get better over the next 2 weeks.  However if you develop severe or worsening pain, low back pain with fever, numbness, weakness or inability to walk or urinate, you should return to the ER immediately.  Please follow up with your doctor this week for a recheck if still having symptoms. Avoid heavy lifting or twisting.  Low back pain is discomfort in the lower back that may be due to injuries to muscles and ligaments around the spine.  Occasionally, it may be caused by a a problem to a part of the spine called a disc.  The pain may last several days or a week;  However, most patients get completely well in 4 weeks.  Self - care:  The application of heat can help soothe the pain.  Maintaining your daily activities, including walking, is encourged, as it will help you get better faster than just staying in bed. Perform gentle stretching as discussed. Drink plenty of fluids.  Medications are also useful to help with pain control.  A commonly prescribed medication includes hydrocodone (your home medication).  Do not drive or operate heavy machinery while taking this medication.  Non steroidal anti inflammatory medications including Ibuprofen and naproxen/etodolac;  These medications help both pain and swelling and are very useful in treating back pain.  They should be taken with food, as they can cause stomach upset, and more seriously, stomach bleeding.    Muscle relaxants (your home flexeril):  These medications can help with muscle tightness that is a cause of lower back pain.  Most of these medications can cause drowsiness, and it is not safe to drive or use dangerous machinery while taking them.  SEEK IMMEDIATE MEDICAL ATTENTION IF: New numbness, tingling, weakness, or problem with the use of your arms or legs.  Severe back pain not relieved with medications.  Difficulty with or loss of  control of your bowel or bladder control.  Increasing pain in any areas of the body (such as chest or abdominal pain).  Shortness of breath, dizziness or fainting.  Nausea (feeling sick to your stomach), vomiting, fever, or sweats.  You will need to follow up with Dr. Annette Stable in 1-2 weeks for reassessment.   Back Injury Prevention The following tips can help you to prevent a back injury. PHYSICAL FITNESS  Exercise often. Try to develop strong stomach (abdominal) muscles.  Do aerobic exercises often. This includes walking, jogging, biking, swimming.  Do exercises that help with balance and strength often. This includes tai chi and yoga.  Stretch before and after you exercise.  Keep a healthy weight. DIET   Ask your doctor how much calcium and vitamin D you need every day.  Include calcium in your diet. Foods high in calcium include dairy products; green, leafy vegetables; and products with calcium added (fortified).  Include vitamin D in your diet. Foods high in vitamin D include milk and products with vitamin D added.  Think about taking a multivitamin or other nutritional products called " supplements."  Stop smoking if you smoke. POSTURE   Sit and stand up straight. Avoid leaning forward or hunching over.  Choose chairs that support your lower back.  If you work at a desk:  Sit close to your work so you do not lean over.  Keep your chin tucked in.  Keep your neck drawn back.  Keep your elbows bent at  a right angle. Your arms should look like the letter "L."  Sit high and close to the steering wheel when you drive. Add low back support to your car seat if needed.  Avoid sitting or standing in one position for too long. Get up and move around every hour. Take breaks if you are driving for a long time.  Sleep on your side with your knees slightly bent. You can also sleep on your back with a pillow under your knees. Do not sleep on your stomach. LIFTING, TWISTING, AND  REACHING  Avoid heavy lifting, especially lifting over and over again. If you must do heavy lifting:  Stretch before lifting.  Work slowly.  Rest between lifts.  Use carts and dollies to move objects when possible.  Make several small trips instead of carrying 1 heavy load.  Ask for help when you need it.  Ask for help when moving big, awkward objects.  Follow these steps when lifting:  Stand with your feet shoulder-width apart.  Get as close to the object as you can. Do not pick up heavy objects that are far from your body.  Use handles or lifting straps when possible.  Bend at your knees. Squat down, but keep your heels off the floor.  Keep your shoulders back, your chin tucked in, and your back straight.  Lift the object slowly. Tighten the muscles in your legs, stomach, and butt. Keep the object as close to the center of your body as possible.  Reverse these directions when you put a load down.  Do not:  Lift the object above your waist.  Twist at the waist while lifting or carrying a load. Move your feet if you need to turn, not your waist.  Bend over without bending at your knees.  Avoid reaching over your head, across a table, or for an object on a high surface. OTHER TIPS  Avoid wet floors and keep sidewalks clear of ice.  Do not sleep on a mattress that is too soft or too hard.  Keep items that you use often within easy reach.  Put heavier objects on shelves at waist level. Put lighter objects on lower or higher shelves.  Find ways to lessen your stress. You can try exercise, massage, or relaxation.  Get help for depression or anxiety if needed. GET HELP IF:  You injure your back.  You have questions about diet, exercise, or other ways to prevent back injuries. MAKE SURE YOU:  Understand these instructions.  Will watch your condition.  Will get help right away if you are not doing well or get worse. Document Released: 12/06/2007 Document  Revised: 09/11/2011 Document Reviewed: 07/31/2011 [facility] Patient Information 2015 [place], [place]. This information is not intended to replace advice given to you by your health care provider. Make sure you discuss any questions you have with your health care provider.   Back Pain, Adult Back pain is very common. The pain often gets better over time. The cause of back pain is usually not dangerous. Most people can learn to manage their back pain on their own.  HOME CARE   Stay active. Start with short walks on flat ground if you can. Try to walk farther each day.  Do not sit, drive, or stand in one place for more than 30 minutes. Do not stay in bed.  Do not avoid exercise or work. Activity can help your back heal faster.  Be careful when you bend or lift an object. [employer]  at your knees, keep the object close to you, and do not twist.  Sleep on a firm mattress. Lie on your side, and bend your knees. If you lie on your back, put a pillow under your knees.  Only take medicines as told by your doctor.  Put ice on the injured area.  Put ice in a plastic bag.  Place a towel between your skin and the bag.  Leave the ice on for 15-20 minutes, 03-04 times a day for the first 2 to 3 days. After that, you can switch between ice and heat packs.  Ask your doctor about back exercises or massage.  Avoid feeling anxious or stressed. Find good ways to deal with stress, such as exercise. GET HELP RIGHT AWAY IF:   Your pain does not go away with rest or medicine.  Your pain does not go away in 1 week.  You have new problems.  You do not feel well.  The pain spreads into your legs.  You cannot control when you poop (bowel movement) or pee (urinate).  Your arms or legs feel weak or lose feeling (numbness).  You feel sick to your stomach (nauseous) or throw up (vomit).  You have belly (abdominal) pain.  You feel like you may pass out (faint). MAKE SURE YOU:   Understand these  instructions.  Will watch your condition.  Will get help right away if you are not doing well or get worse. Document Released: 12/06/2007 Document Revised: 09/11/2011 Document Reviewed: 10/21/2013 [facility] Patient Information 2015 [place], [place]. This information is not intended to replace advice given to you by your health care provider. Make sure you discuss any questions you have with your health care provider.  Heat Therapy Heat therapy can help make painful, stiff muscles and joints feel better. Do not use heat on new injuries. Wait at least 48 hours after an injury to use heat. Do not use heat when you have aches or pains right after an activity. If you still have pain 3 hours after stopping the activity, then you may use heat. HOME CARE Wet heat pack  Soak a clean towel in warm water. Squeeze out the extra water.  Put the warm, wet towel in a plastic bag.  Place a thin, dry towel between your skin and the bag.  Put the heat pack on the area for 5 minutes, and check your skin. Your skin may be pink, but it should not be red.  Leave the heat pack on the area for 15 to 30 minutes.  Repeat this every 2 to 4 hours while awake. Do not use heat while you are sleeping. Warm water bath  Fill a tub with warm water.  Place the affected body part in the tub.  Soak the area for 20 to 40 minutes.  Repeat as needed. Hot water bottle  Fill the water bottle half full with hot water.  Press out the extra air. Close the cap tightly.  Place a dry towel between your skin and the bottle.  Put the bottle on the area for 5 minutes, and check your skin. Your skin may be pink, but it should not be red.  Leave the bottle on the area for 15 to 30 minutes.  Repeat this every 2 to 4 hours while awake. Electric heating pad  Place a dry towel between your skin and the heating pad.  Set the heating pad on low heat.  Put the heating pad on the area for 10  minutes, and check your skin. Your  skin may be pink, but it should not be red.  Leave the heating pad on the area for 20 to 40 minutes.  Repeat this every 2 to 4 hours while awake.  Do not lie on the heating pad.  Do not fall asleep while using the heating pad.  Do not use the heating pad near water. GET HELP RIGHT AWAY IF:  You get blisters or red skin.  Your skin is puffy (swollen), or you lose feeling (numbness) in the affected area.  You have any new problems.  Your problems are getting worse.  You have any questions or concerns. If you have any problems, stop using heat therapy until you see your doctor. MAKE SURE YOU:  Understand these instructions.  Will watch your condition.  Will get help right away if you are not doing well or get worse. Document Released: 09/11/2011 Document Reviewed: 08/12/2013 [facility] Patient Information 2015 [place]. This information is not intended to replace advice given to you by your health care provider. Make sure you discuss any questions you have with your health care provider.  Muscle Cramps and Spasms Muscle cramps and spasms occur when a muscle or muscles tighten and you have no control over this tightening (involuntary muscle contraction). They are a common problem and can develop in any muscle. The most common place is in the calf muscles of the leg. Both muscle cramps and muscle spasms are involuntary muscle contractions, but they also have differences:   Muscle cramps are sporadic and painful. They may last a few seconds to a quarter of an hour. Muscle cramps are often more forceful and last longer than muscle spasms.  Muscle spasms may or may not be painful. They may also last just a few seconds or much longer. CAUSES  It is uncommon for cramps or spasms to be due to a serious underlying problem. In many cases, the cause of cramps or spasms is unknown. Some common causes are:   Overexertion.   Overuse from repetitive motions (doing the same thing over  and over).   Remaining in a certain position for a long period of time.   Improper preparation, form, or technique while performing a sport or activity.   Dehydration.   Injury.   Side effects of some medicines.   Abnormally low levels of the salts and ions in your blood (electrolytes), especially potassium and calcium. This could happen if you are taking water pills (diuretics) or you are pregnant.  Some underlying medical problems can make it more likely to develop cramps or spasms. These include, but are not limited to:   Diabetes.   Parkinson disease.   Hormone disorders, such as thyroid problems.   Alcohol abuse.   Diseases specific to muscles, joints, and bones.   Blood vessel disease where not enough blood is getting to the muscles.  HOME CARE INSTRUCTIONS   Stay well hydrated. Drink enough water and fluids to keep your urine clear or pale yellow.  It may be helpful to massage, stretch, and relax the affected muscle.  For tight or tense muscles, use a warm towel, heating pad, or hot shower water directed to the affected area.  If you are sore or have pain after a cramp or spasm, applying ice to the affected area may relieve discomfort.  Put ice in a plastic bag.  Place a towel between your skin and the bag.  Leave the ice on for 15-20 minutes, 03-04  times a day.  Medicines used to treat a known cause of cramps or spasms may help reduce their frequency or severity. Only take over-the-counter or prescription medicines as directed by your caregiver. SEEK MEDICAL CARE IF:  Your cramps or spasms get more severe, more frequent, or do not improve over time.  MAKE SURE YOU:   Understand these instructions.  Will watch your condition.  Will get help right away if you are not doing well or get worse. Document Released: 12/09/2001 Document Revised: 10/14/2012 Document Reviewed: 06/05/2012 [facility] Patient Information 2015 [place], [place]. This information  is not intended to replace advice given to you by your health care provider. Make sure you discuss any questions you have with your health care provider.

## 2015-01-05 ENCOUNTER — Encounter: Payer: Self-pay | Admitting: Family Medicine

## 2015-01-05 ENCOUNTER — Ambulatory Visit: Payer: Medicare PPO | Admitting: Family Medicine

## 2015-01-05 ENCOUNTER — Ambulatory Visit (INDEPENDENT_AMBULATORY_CARE_PROVIDER_SITE_OTHER): Payer: Medicare PPO | Admitting: Family Medicine

## 2015-01-05 VITALS — BP 160/86 | HR 91 | Temp 98.3°F | Ht 68.0 in | Wt 165.2 lb

## 2015-01-05 DIAGNOSIS — M5442 Lumbago with sciatica, left side: Secondary | ICD-10-CM

## 2015-01-05 MED ORDER — PREDNISONE 10 MG PO TABS: ORAL_TABLET | ORAL | Status: DC

## 2015-01-05 NOTE — Progress Notes (Signed)
Subjective:    Patient ID: Aaron Brown., male    DOB: 03/07/1946, 69 y.o.   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)  Tight the next day -tight left lumbar  July 2nd - much worse - spasm/crumpled to the floor - that happened 3 times  Then he started lodine (naprosyn too)  and took flexeril  Then hydrocodone on July 4th  [facility]uggested f/u Dr Trenton Gammon - he did not make an appt   Walking with crutches   Pressure in the top of his leg (L)   Has done PT in the past   Hx of spinal stenosis and bulging disc with spinal stenosis L4/5 --has had 2 surgeries  Pt adds "a ruptured disc at L1-L2)    Burning / sharp pain L low back  Pressure in L thigh  No foot drop  In the am a little numbness in L thigh    In the past prednisone helps  Has not done PT since his last flare   Patient Active Problem List   Diagnosis Date Noted  . Advanced care planning/counseling discussion 12/14/2014  . Health maintenance examination 12/14/2014  . Medicare annual wellness visit, subsequent 12/03/2013  . Asymmetrical left sensorineural hearing loss 12/01/2013  . Cerebral microvascular disease 12/01/2013  . Lip swelling 10/09/2013  . History of nonmelanoma skin cancer 07/30/2013  . Hypertension   . Seasonal allergies   . Increased pressure in the eye   . Lower back pain   . Prediabetes    Past Medical History  Diagnosis Date  . Colon polyp 05/2005    ?hyperplastic  . Hypertension   . History of chicken pox   . Blood in stool     in the past; ? hemorrhoids  . Seasonal allergies   . Squamous cell cancer of skin of hand   . Squamous acanthoma of face   . Increased pressure in the eye     Dr. Gloriann Loan  . Lower back pain     bulging disc with spinal stenosis L4/5 s/p surgery x2  . Prediabetes     A1c 6.0 (2012)  . Asymmetrical left sensorineural hearing loss 12/2013    s/p MRI pending hearing aides [name])  .  Cerebral microvascular disease 12/2013    by MRI, referred to neuro by ENT   Past Surgical History  Procedure Laterality Date  . Laminectomy  1988    Dr. Pearlie Oyster; L5  . Hemi-laminectomy  2000    Dr. Annette Stable, Dr. Hal Neer L4/L5  . Bunionectomy  2003    Dr. Barkley Bruns  . Squamous cell carcinoma excision  2014    face and hand  . Dobutamine stress echo  2013    normal per prior PCP records  . Colonoscopy  05/2005    1 polyp, int hem    History  Substance Use Topics  . Smoking status: Never Smoker   . Smokeless tobacco: Never Used  . Alcohol Use: 0.0 oz/week    0 Standard drinks or equivalent per week     Comment: Occasional-wine   Family History  Problem Relation Age of Onset  . Hypertension Mother   . Arthritis Father   . Hypertension Father   . Alcohol abuse Maternal Grandfather   . Cancer Maternal Aunt     colon  . Cancer Cousin     colon  . Cancer Cousin  breast   No Known Allergies Current Outpatient Prescriptions on File Prior to Visit  Medication Sig Dispense Refill  . amLODipine (NORVASC) 5 MG tablet TAKE 1 TABLET (5 MG TOTAL) BY MOUTH DAILY. 90 tablet 1  . aspirin 81 MG tablet Take 81 mg by mouth daily.    . beta carotene w/minerals (OCUVITE) tablet Take 1 tablet by mouth daily.    . cholecalciferol (VITAMIN D) 1000 UNITS tablet Take 1,000 Units by mouth daily.    . cyclobenzaprine (FLEXERIL) 10 MG tablet Take 1 tablet (10 mg total) by mouth 3 (three) times daily as needed for muscle spasms. 40 tablet 0  . etodolac (LODINE) 400 MG tablet Take 1 tablet (400 mg total) by mouth 2 (two) times daily after a meal. 30 tablet 3  . HYDROcodone-acetaminophen (NORCO/VICODIN) 5-325 MG per tablet Take 1 tablet by mouth every 8 (eight) hours as needed for moderate pain. 30 tablet 0  . naproxen (NAPROSYN) 500 MG tablet Take twice daily as needed with food 60 tablet 0   No current facility-administered medications on file prior to visit.      Review of Systems Review of  Systems  Constitutional: Negative for fever, appetite change, fatigue and unexpected weight change.  Eyes: Negative for pain and visual disturbance.  Respiratory: Negative for cough and shortness of breath.   Cardiovascular: Negative for cp or palpitations    Gastrointestinal: Negative for nausea, diarrhea and constipation.  Genitourinary: Negative for urgency and frequency.  Skin: Negative for pallor or rash   MSK pos for L low back and leg pain , neg for joint swelling  Neurological: Negative for weakness, light-headedness,  and headaches.  Hematological: Negative for adenopathy. Does not bruise/bleed easily.  Psychiatric/Behavioral: Negative for dysphoric mood. The patient is not nervous/anxious.         Objective:   Physical Exam  Constitutional: He appears well-developed and well-nourished. No distress.  Eyes: Conjunctivae and EOM are normal. Pupils are equal, round, and reactive to light.  Neck: Normal range of motion. Neck supple.  Cardiovascular: Normal rate and regular rhythm.   Musculoskeletal: He exhibits tenderness. He exhibits no edema.       Lumbar back: He exhibits decreased range of motion, tenderness, bony tenderness and spasm. He exhibits no edema and no deformity.  L lumbar spasm Flex 50 deg/ ext 30 deg with discomfort (lumbar) Nl gait/no foot drop No acute neuro changes Pos SLR for L buttock pain     Lymphadenopathy:    He has no cervical adenopathy.  Neurological: He is alert. He has normal strength and normal reflexes. He displays no atrophy. No sensory deficit. He exhibits normal muscle tone. Coordination normal.  Skin: Skin is warm and dry. No rash noted. No erythema. No pallor.  Psychiatric: He has a normal mood and affect.          Assessment & Plan:   Problem List Items Addressed This Visit    Lower back pain - Primary    Rev prev records- hx of multilevel disc dz and spinal stenosis Today - exam consistent with muscle spasm/ no neuro symptoms    Adv NOT to mix nsaids pred taper 30 mg today  If worse- will f/u with neuro surg (red flags disc)  Flexeril prn  hydrocondone prn       Relevant Medications   predniSONE (DELTASONE) 10 MG tablet

## 2015-01-05 NOTE — Progress Notes (Signed)
Pre visit review using our clinic review tool, if applicable. No additional management support is needed unless otherwise documented below in the visit note. 

## 2015-01-05 NOTE — Assessment & Plan Note (Signed)
Rev prev records- hx of multilevel disc dz and spinal stenosis Today - exam consistent with muscle spasm/ no neuro symptoms  Adv NOT to mix nsaids pred taper 30 mg today  If worse- will f/u with neuro surg (red flags disc)  Flexeril prn  hydrocondone prn

## 2015-01-05 NOTE — Patient Instructions (Signed)
Use prednisone as directed (no lodine/naprosyn or other nsaids when on this) When done with it - lodine is ok - do not mix with naprosyn  Flexeril is ok up to three times daily  Hydrocodone for severe pain  If no improvement or if worse - do call Dr Irven [place] office  If sudden loss of bladder or bowel control or numbness or weakness -go to the ED

## 2015-01-08 ENCOUNTER — Telehealth: Payer: Self-pay

## 2015-01-08 NOTE — Telephone Encounter (Signed)
It is ok to take the flexeril with it

## 2015-01-08 NOTE — Telephone Encounter (Signed)
Pt notified it's okay to take the flexeril

## 2015-01-08 NOTE — Telephone Encounter (Signed)
Pt was seen 01/05/15 and prednisone was prescribed; pt doing well with prednisone; pt was instructed not to take naprosyn and lodine with prednisone. Pt wants to verify OK to take Flexeril when taking prednisone due to pt having back spasms in early morning when pt gets up.Please advise. Reviewed pts instructions with pt from 01/05/15 visit but still wants request to be confirmed by Dr Glori Bickers. Pt request cb.

## 2015-05-22 ENCOUNTER — Other Ambulatory Visit: Payer: Self-pay | Admitting: Family Medicine

## 2015-11-23 DIAGNOSIS — L578 Other skin changes due to chronic exposure to nonionizing radiation: Secondary | ICD-10-CM | POA: Diagnosis not present

## 2015-11-23 DIAGNOSIS — L905 Scar conditions and fibrosis of skin: Secondary | ICD-10-CM | POA: Diagnosis not present

## 2015-11-23 DIAGNOSIS — L814 Other melanin hyperpigmentation: Secondary | ICD-10-CM | POA: Diagnosis not present

## 2015-11-23 DIAGNOSIS — L570 Actinic keratosis: Secondary | ICD-10-CM | POA: Diagnosis not present

## 2015-11-23 DIAGNOSIS — L720 Epidermal cyst: Secondary | ICD-10-CM | POA: Diagnosis not present

## 2015-11-23 DIAGNOSIS — L738 Other specified follicular disorders: Secondary | ICD-10-CM | POA: Diagnosis not present

## 2015-11-23 DIAGNOSIS — Z85828 Personal history of other malignant neoplasm of skin: Secondary | ICD-10-CM | POA: Diagnosis not present

## 2015-11-24 ENCOUNTER — Encounter: Payer: Self-pay | Admitting: Podiatry

## 2015-11-24 ENCOUNTER — Ambulatory Visit (INDEPENDENT_AMBULATORY_CARE_PROVIDER_SITE_OTHER): Payer: PPO | Admitting: Podiatry

## 2015-11-24 ENCOUNTER — Ambulatory Visit (INDEPENDENT_AMBULATORY_CARE_PROVIDER_SITE_OTHER): Payer: PPO

## 2015-11-24 VITALS — BP 154/96 | HR 64 | Resp 16

## 2015-11-24 DIAGNOSIS — M2011 Hallux valgus (acquired), right foot: Secondary | ICD-10-CM

## 2015-11-24 DIAGNOSIS — M2042 Other hammer toe(s) (acquired), left foot: Secondary | ICD-10-CM

## 2015-11-24 NOTE — Progress Notes (Signed)
   Subjective:    Patient ID: Aaron Brown., male    DOB: 1946/01/17, 70 y.o.   MRN: [id number]  HPI: He presents today with a several year history of pain to the forefoot left. He states that it feels that all of my toes are getting mashed together. He relates that he had bunion surgery to the left foot many years ago by local doctor and his toe really never settled down correctly. He states that the toes seem to be curling under and are becoming very painful. He states that his right foot has been increasingly painful over the past few months as his bunion worsens. He is also concerned about the rotation and curling of those toes as well.    Review of Systems  HENT: Positive for hearing loss.   Musculoskeletal: Positive for arthralgias.  Hematological: Bruises/bleeds easily.  All other systems reviewed and are negative.      Objective:   Physical Exam: Vital signs are stable he is alert and oriented 3 have reviewed his past medical history medications allergies surgery social history and review of systems. Pulses are strongly palpable. Neurologic sensorium is intact. Deep tendon reflexes are intact muscle strength is normal. Orthopedic evaluation demonstrates hallux abductovalgus deformity of the right foot reducible and flexible in nature. Also has flexible hammertoe deformities 2 through 4 of the right foot. Left foot does demonstrate erectus first metatarsophalangeal joint however there is some plantar flexion and valgus rotation of the hallux secondary to the lack of plantar flexion at the metatarsophalangeal joint. At this point we have a hallux malleus. Also this is encroaching on the second toe which is causing pressure against the third and fourth and fifth toes. Radiographs confirm hallux abductovalgus deformity right with hammertoe deformities. Left foot does demonstrate a hallux interphalangeal with a hallux malleus left and hammertoe deformities.          Assessment &  Plan:  Assessment: Hallux abductovalgus deformity with hammertoes right foot hallux malleus deformity with hammertoes left foot  Plan: We discussed the need for surgical intervention today in stance this is amenable to a like to wait until fall of this year. At which time we will correct the left foot which will consist of removal of internal fixation first metatarsal left foot. A release of the first metatarsophalangeal joint and a IPJ fusion. Hammertoe repair #2 3 #4 left foot with screws or pins.

## 2015-12-13 ENCOUNTER — Telehealth: Payer: Self-pay | Admitting: Podiatry

## 2015-12-13 NOTE — Telephone Encounter (Signed)
Pt left Vm Thursday afternoon @5 . He states that we sent his claim to the wrong insurance. That is all he said at that time wanting someone to give him a call back

## 2015-12-15 ENCOUNTER — Other Ambulatory Visit: Payer: Self-pay | Admitting: Family Medicine

## 2015-12-15 DIAGNOSIS — Z125 Encounter for screening for malignant neoplasm of prostate: Secondary | ICD-10-CM

## 2015-12-15 DIAGNOSIS — Z1159 Encounter for screening for other viral diseases: Secondary | ICD-10-CM

## 2015-12-15 DIAGNOSIS — R7303 Prediabetes: Secondary | ICD-10-CM

## 2015-12-15 DIAGNOSIS — I10 Essential (primary) hypertension: Secondary | ICD-10-CM

## 2015-12-16 ENCOUNTER — Ambulatory Visit (INDEPENDENT_AMBULATORY_CARE_PROVIDER_SITE_OTHER): Payer: PPO

## 2015-12-16 ENCOUNTER — Other Ambulatory Visit (INDEPENDENT_AMBULATORY_CARE_PROVIDER_SITE_OTHER): Payer: PPO

## 2015-12-16 VITALS — BP 128/70 | HR 61 | Temp 98.1°F | Ht 66.0 in | Wt 159.0 lb

## 2015-12-16 DIAGNOSIS — Z0000 Encounter for general adult medical examination without abnormal findings: Secondary | ICD-10-CM | POA: Diagnosis not present

## 2015-12-16 DIAGNOSIS — I10 Essential (primary) hypertension: Secondary | ICD-10-CM

## 2015-12-16 DIAGNOSIS — Z1159 Encounter for screening for other viral diseases: Secondary | ICD-10-CM | POA: Diagnosis not present

## 2015-12-16 DIAGNOSIS — R7303 Prediabetes: Secondary | ICD-10-CM

## 2015-12-16 DIAGNOSIS — Z125 Encounter for screening for malignant neoplasm of prostate: Secondary | ICD-10-CM | POA: Diagnosis not present

## 2015-12-16 LAB — BASIC METABOLIC PANEL
BUN: 20 mg/dL (ref 6–23)
CO2: 29 mEq/L (ref 19–32)
Calcium: 9.1 mg/dL (ref 8.4–10.5)
Chloride: 103 mEq/L (ref 96–112)
Creatinine, Ser: 1.16 mg/dL (ref 0.40–1.50)
GFR: 66.47 mL/min (ref >60.00)
GLUCOSE: 102 mg/dL — AB (ref 70–99)
POTASSIUM: 4.7 meq/L (ref 3.5–5.1)
Sodium: 137 mEq/L (ref 135–145)

## 2015-12-16 LAB — PSA, MEDICARE: PSA: 1.76 ng/mL (ref 0.10–4.00)

## 2015-12-16 LAB — HEMOGLOBIN A1C: Hgb A1c MFr Bld: 5.9 % (ref 4.6–6.5)

## 2015-12-16 NOTE — Progress Notes (Signed)
Pre visit review using our clinic review tool, if applicable. No additional management support is needed unless otherwise documented below in the visit note. 

## 2015-12-16 NOTE — Patient Instructions (Addendum)
Aaron Brown , Thank you for taking time to come for your Medicare Wellness Visit. I appreciate your ongoing commitment to your health goals. Please review the following plan we discussed and let me know if I can assist you in the future.   These are the goals we discussed: Goals    . Increase physical activity     Starting 12/16/2015, I will continue to walk most days 1-2 miles, do landscape work, and do back exercises at night for 1 hr.        This is a list of the screening recommended for you and due dates:  Health Maintenance  Topic Date Due  . Colon Cancer Screening  07/02/2016*  . Flu Shot  02/01/2016  . Tetanus Vaccine  12/26/2021  . DTaP/Tdap/Td vaccine  Completed  . Shingles Vaccine  Completed  .  Hepatitis C: One time screening is recommended by Center for Disease Control  (CDC) for  adults born from [birth year] through 1963.   Completed  . Pneumonia vaccines  Completed  *Topic was postponed. The date shown is not the original due date.    Preventive Care for Adults  A healthy lifestyle and preventive care can promote health and wellness. Preventive health guidelines for adults include the following key practices.  . A routine yearly physical is a good way to check with your health care provider about your health and preventive screening. It is a chance to share any concerns and updates on your health and to receive a thorough exam.  . Visit your dentist for a routine exam and preventive care every 6 months. Brush your teeth twice a day and floss once a day. Good oral hygiene prevents tooth decay and gum disease.  . The frequency of eye exams is based on your age, health, family medical history, use  of contact lenses, and other factors. Follow your health care provider's ecommendations for frequency of eye exams.  . Eat a healthy diet. Foods like vegetables, fruits, whole grains, low-fat dairy products, and lean protein foods contain the nutrients you need without too many  calories. Decrease your intake of foods high in solid fats, added sugars, and salt. Eat the right amount of calories for you. Get information about a proper diet from your health care provider, if necessary.  . Regular physical exercise is one of the most important things you can do for your health. Most adults should get at least 150 minutes of moderate-intensity exercise (any activity that increases your heart rate and causes you to sweat) each week. In addition, most adults need muscle-strengthening exercises on 2 or more days a week.  Silver Sneakers may be a benefit available to you. To determine eligibility, you may visit the website: www.silversneakers.com or contact program at [phone])600-0607 Mon-Fri between 8AM-8PM.   . Maintain a healthy weight. The body mass index (BMI) is a screening tool to identify possible weight problems. It provides an estimate of body fat based on height and weight. Your health care provider can find your BMI and can help you achieve or maintain a healthy weight.   For adults 20 years and older: ? A BMI below 18.5 is considered underweight. ? A BMI of 18.5 to 24.9 is normal. ? A BMI of 25 to 29.9 is considered overweight. ? A BMI of 30 and above is considered obese.   . Maintain normal blood lipids and cholesterol levels by exercising and minimizing your intake of saturated fat. Eat a balanced diet with plenty  of fruit and vegetables. Blood tests for lipids and cholesterol should begin at age 78 and be repeated every 5 years. If your lipid or cholesterol levels are high, you are over 50, or you are at high risk for heart disease, you may need your cholesterol levels checked more frequently. Ongoing high lipid and cholesterol levels should be treated with medicines if diet and exercise are not working.  . If you smoke, find out from your health care provider how to quit. If you do not use tobacco, please do not start.  . If you choose to drink alcohol, please do  not consume more than 2 drinks per day. One drink is considered to be 12 ounces (355 mL) of beer, 5 ounces (148 mL) of wine, or 1.5 ounces (44 mL) of liquor.  . If you are 90-84 years old, ask your health care provider if you should take aspirin to prevent strokes.  . Use sunscreen. Apply sunscreen liberally and repeatedly throughout the day. You should seek shade when your shadow is shorter than you. Protect yourself by wearing long sleeves, pants, a wide-brimmed hat, and sunglasses year round, whenever you are outdoors.  . Once a month, do a whole body skin exam, using a mirror to look at the skin on your back. Tell your health care provider of new moles, moles that have irregular borders, moles that are larger than a pencil eraser, or moles that have changed in shape or color.

## 2015-12-16 NOTE — Progress Notes (Signed)
Subjective:   Aaron Kless. is a 69 y.o. male who presents for Medicare Annual/Subsequent preventive examination.  Review of Systems:  N/A Cardiac Risk Factors include: advanced age (>89men, >37 women);male gender;hypertension     Objective:    Vitals: BP 128/70 mmHg  Pulse 61  Temp(Src) 98.1 F (36.7 C) (Oral)  Ht 5\' 6"  (1.676 m)  Wt 159 lb (72.122 kg)  BMI 25.68 kg/m2  SpO2 98%  Body mass index is 25.68 kg/(m^2).  Tobacco History  Smoking status  . Never Smoker   Smokeless tobacco  . Never Used     Counseling given: No   Past Medical History  Diagnosis Date  . Colon polyp 05/2005    ?hyperplastic  . Hypertension   . History of chicken pox   . Blood in stool     in the past; ? hemorrhoids  . Seasonal allergies   . Squamous cell cancer of skin of hand   . Squamous acanthoma of face   . Increased pressure in the eye     Dr. Gloriann Loan  . Lower back pain     bulging disc with spinal stenosis L4/5 s/p surgery x2  . Prediabetes     A1c 6.0 (2012)  . Asymmetrical left sensorineural hearing loss 12/2013    s/p MRI pending hearing aides [name])  . Cerebral microvascular disease 12/2013    by MRI, referred to neuro by ENT   Past Surgical History  Procedure Laterality Date  . Laminectomy  1988    Dr. Pearlie Oyster; L5  . Hemi-laminectomy  2000    Dr. Annette Stable, Dr. Hal Neer L4/L5  . Bunionectomy  2003    Dr. Barkley Bruns  . Squamous cell carcinoma excision  2014    face and hand  . Dobutamine stress echo  2013    normal per prior PCP records  . Colonoscopy  05/2005    1 polyp, int hem    Family History  Problem Relation Age of Onset  . Hypertension Mother   . Arthritis Father   . Hypertension Father   . Alcohol abuse Maternal Grandfather   . Cancer Maternal Aunt     colon  . Cancer Cousin     colon  . Cancer Cousin     breast   History  Sexual Activity  . Sexual Activity: Yes    Outpatient Encounter Prescriptions as of 12/16/2015  Medication Sig  .  amLODipine (NORVASC) 5 MG tablet TAKE 1 TABLET (5 MG TOTAL) BY MOUTH DAILY.  Marland Kitchen aspirin 81 MG tablet Take 81 mg by mouth daily.  . beta carotene w/minerals (OCUVITE) tablet Take 1 tablet by mouth daily.  . cholecalciferol (VITAMIN D) 1000 UNITS tablet Take 1,000 Units by mouth daily.   No facility-administered encounter medications on file as of 12/16/2015.    Activities of Daily Living In your present state of health, do you have any difficulty performing the following activities: 12/16/2015  Hearing? Y  Vision? N  Difficulty concentrating or making decisions? N  Walking or climbing stairs? N  Dressing or bathing? N  Doing errands, shopping? N  Preparing Food and eating ? N  Using the Toilet? N  In the past six months, have you accidently leaked urine? N  Do you have problems with loss of bowel control? N  Managing your Medications? N  Managing your Finances? N  Housekeeping or managing your Housekeeping? N    Patient Care Team: Ria Bush, MD as PCP - General (Family  Medicine) Crista Curb. Gloriann Loan, MD as Referring Physician (Ophthalmology) Paschal Dopp. [place], MD as Referring Physician (Dentistry) Brendolyn Patty, MD as Referring Physician (Dermatology) [place], DPM as Consulting Physician (Podiatry)   Assessment:    Hearing Screening Comments: Wears bilateral hearing aids Vision Screening Comments: Last eye exam in Nov 2016 with Dr. Gloriann Loan  Exercise Activities and Dietary recommendations Current Exercise Habits: Home exercise routine, Type of exercise: walking;Other - see comments;stretching (landscaping), Time (Minutes): 60, Frequency (Times/Week): 7, Weekly Exercise (Minutes/Week): 420, Intensity: Moderate, Exercise limited by: None identified  Goals    . Increase physical activity     Starting 12/16/2015, I will continue to walk most days 1-2 miles, do landscape work, and do back exercises at night for 1 hr.       Fall Risk Fall Risk  12/16/2015 12/14/2014 12/03/2013    Falls in the past year? No No No   Depression Screen PHQ 2/9 Scores 12/16/2015 12/14/2014 12/03/2013  PHQ - 2 Score 0 0 0    Cognitive Testing MMSE - Mini Mental State Exam 12/16/2015  Orientation to time 5  Orientation to Place 5  Registration 3  Attention/ Calculation 0  Recall 3  Language- name 2 objects 0  Language- repeat 1  Language- follow 3 step command 3  Language- read & follow direction 0  Write a sentence 0  Copy design 0  Total score 20   PLEASE NOTE: A Mini-Cog screen was completed. Maximum score is 20. A value of 0 denotes this part of Folstein MMSE was not completed or the patient failed this part of the Mini-Cog screening.   Mini-Cog Screening Orientation to Time - Max 5 pts Orientation to Place - Max 5 pts Registration - Max 3 pts Recall - Max 3 pts Language Repeat - Max 1 pts Language Follow 3 Step Command - Max 3 pts  Immunization History  Administered Date(s) Administered  . Pneumococcal Conjugate-13 12/03/2013  . Pneumococcal Polysaccharide-23 11/11/2012  . Tdap 12/27/2011  . Zoster 07/04/2011   Screening Tests Health Maintenance  Topic Date Due  . COLONOSCOPY  07/02/2016 (Originally 05/12/2015)  . INFLUENZA VACCINE  02/01/2016  . TETANUS/TDAP  12/26/2021  . DTaP/Tdap/Td  Completed  . ZOSTAVAX  Completed  . Hepatitis C Screening  Completed  . PNA vac Low Risk Adult  Completed      Plan:     I have personally reviewed and addressed the Medicare Annual Wellness questionnaire and have noted the following in the patient's chart:  A. Medical and social history B. Use of alcohol, tobacco or illicit drugs  C. Current medications and supplements D. Functional ability and status E.  Nutritional status F.  Physical activity G. Advance directives H. List of other physicians I.  Hospitalizations, surgeries, and ER visits in previous 12 months J.  [place] to include hearing, vision, cognitive, depression L. Referrals and appointments  - none  In addition, I have reviewed and discussed with patient certain preventive protocols, quality metrics, and best practice recommendations. A written personalized care plan for preventive services as well as general preventive health recommendations were provided to patient.  See attached scanned questionnaire for additional information.   Signed,   Lindell Noe, MHA, BS, LPN Health Advisor

## 2015-12-16 NOTE — Progress Notes (Signed)
PCP notes:  Health maintenance:  Colon cancer screening - postponed/pt will schedule Hep C screening - completed  Abnormal screenings: None  Patient concerns:   A. Pt states toes have become contracted and bunions are present to bilateral feet. Surgery has been recommended.  B. Pt states dentist has found an area of concern on tip of tongue and may recommend a biopsy be completed.   Nurse concerns: None  Next PCP appt: 12/31/15 @ 1530

## 2015-12-17 LAB — HEPATITIS C ANTIBODY: HCV AB: NEGATIVE

## 2015-12-17 NOTE — Progress Notes (Signed)
I reviewed health advisor's note, was available for consultation, and agree with documentation and plan.  

## 2015-12-21 ENCOUNTER — Encounter: Payer: Medicare PPO | Admitting: Family Medicine

## 2015-12-31 ENCOUNTER — Encounter: Payer: Self-pay | Admitting: Family Medicine

## 2015-12-31 ENCOUNTER — Ambulatory Visit (INDEPENDENT_AMBULATORY_CARE_PROVIDER_SITE_OTHER): Payer: PPO | Admitting: Family Medicine

## 2015-12-31 VITALS — BP 116/84 | HR 68 | Temp 98.0°F | Ht 67.0 in | Wt 159.0 lb

## 2015-12-31 DIAGNOSIS — R7303 Prediabetes: Secondary | ICD-10-CM

## 2015-12-31 DIAGNOSIS — I10 Essential (primary) hypertension: Secondary | ICD-10-CM | POA: Diagnosis not present

## 2015-12-31 DIAGNOSIS — Z7189 Other specified counseling: Secondary | ICD-10-CM | POA: Diagnosis not present

## 2015-12-31 DIAGNOSIS — Z0000 Encounter for general adult medical examination without abnormal findings: Secondary | ICD-10-CM | POA: Diagnosis not present

## 2015-12-31 NOTE — Assessment & Plan Note (Signed)
Preventative protocols reviewed and updated unless pt declined. Discussed healthy diet and lifestyle.  

## 2015-12-31 NOTE — Progress Notes (Signed)
Pre visit review using our clinic review tool, if applicable. No additional management support is needed unless otherwise documented below in the visit note. 

## 2015-12-31 NOTE — Progress Notes (Addendum)
BP 116/84 mmHg  Pulse 68  Temp(Src) 98 F (36.7 C) (Oral)  Ht 5\' 7"  (1.702 m)  Wt 159 lb (72.122 kg)  BMI 24.90 kg/m2  SpO2 96%   CC: CPE  Subjective:    Patient ID: Aaron Evangelist., male    DOB: 1946-06-28, 70 y.o.   MRN: [id number]  HPI: Aaron Dingley. is a 70 y.o. male presenting on 12/31/2015 for Medicare Wellness   Saw Katha Cabal last week for medicare wellness visit. Note reviewed. Some concern over bunions on feet, and lesion on tip of tongue.   Preventative: COLONOSCOPY Date: 05/2005 1 polyp, int hem, told ok to rpt 10 yrs ([facility]). Planning on rpt colonoscopy by end of year.  Prostate screening - desired yearly. Flu shot - declines  Pneumovax 2014, prevnar 12/2013  Tdap 12/2011  Zostavax 2013  Advanced directive: has not set up but planning to. HCPOA would be wife then daughter.  Seat belt use discussed Sunscreen use discussed - no changing moles. Sees dermatologist regularly.   Lives with wife  Grown children  Occupation: was Government social research officer, now owns landscaping business  Edu: Sports administrator  Activity: stays active at work, walking at home Diet: good water, fruits/vegetables some   Relevant past medical, surgical, family and social history reviewed and updated as indicated. Interim medical history since our last visit reviewed. Allergies and medications reviewed and updated. Current Outpatient Prescriptions on File Prior to Visit  Medication Sig  . amLODipine (NORVASC) 5 MG tablet TAKE 1 TABLET (5 MG TOTAL) BY MOUTH DAILY.  Marland Kitchen aspirin 81 MG tablet Take 81 mg by mouth daily.  . beta carotene w/minerals (OCUVITE) tablet Take 1 tablet by mouth daily.  . cholecalciferol (VITAMIN D) 1000 UNITS tablet Take 1,000 Units by mouth daily.   No current facility-administered medications on file prior to visit.    Review of Systems  Constitutional: Negative for fever, chills, activity change, appetite change, fatigue and unexpected weight change.  HENT:  Negative for hearing loss.   Eyes: Negative for visual disturbance.  Respiratory: Negative for cough, chest tightness, shortness of breath and wheezing.   Cardiovascular: Negative for chest pain, palpitations and leg swelling.  Gastrointestinal: Negative for nausea, vomiting, abdominal pain, diarrhea, constipation, blood in stool and abdominal distention.  Genitourinary: Negative for hematuria and difficulty urinating.  Musculoskeletal: Negative for myalgias, arthralgias and neck pain.  Skin: Negative for rash.  Neurological: Negative for dizziness, seizures, syncope and headaches.  Hematological: Negative for adenopathy. Does not bruise/bleed easily.  Psychiatric/Behavioral: Negative for dysphoric mood. The patient is not nervous/anxious.    Per HPI unless specifically indicated in ROS section     Objective:    BP 116/84 mmHg  Pulse 68  Temp(Src) 98 F (36.7 C) (Oral)  Ht 5\' 7"  (1.702 m)  Wt 159 lb (72.122 kg)  BMI 24.90 kg/m2  SpO2 96%  Wt Readings from Last 3 Encounters:  12/31/15 159 lb (72.122 kg)  12/16/15 159 lb (72.122 kg)  01/05/15 165 lb 4 oz (74.957 kg)    Physical Exam  Constitutional: He is oriented to person, place, and time. He appears well-developed and well-nourished. No distress.  HENT:  Head: Normocephalic and atraumatic.  Right Ear: Hearing, tympanic membrane, external ear and ear canal normal.  Left Ear: Hearing, tympanic membrane, external ear and ear canal normal.  Nose: Nose normal.  Mouth/Throat: Uvula is midline, oropharynx is clear and moist and mucous membranes are normal. No oropharyngeal exudate, posterior oropharyngeal edema  or posterior oropharyngeal erythema.  Eyes: Conjunctivae and EOM are normal. Pupils are equal, round, and reactive to light. No scleral icterus.  Neck: Normal range of motion. Neck supple. Carotid bruit is not present. No thyromegaly present.  Cardiovascular: Normal rate, regular rhythm, normal heart sounds and intact distal  pulses.   No murmur heard. Pulses:      Radial pulses are 2+ on the right side, and 2+ on the left side.  Pulmonary/Chest: Effort normal and breath sounds normal. No respiratory distress. He has no wheezes. He has no rales.  Abdominal: Soft. Bowel sounds are normal. He exhibits no distension and no mass. There is no tenderness. There is no rebound and no guarding.  Genitourinary: Rectum normal and prostate normal. Rectal exam shows no external hemorrhoid, no internal hemorrhoid, no fissure, no mass, no tenderness and anal tone normal. Prostate is not enlarged and not tender.  Musculoskeletal: Normal range of motion. He exhibits no edema.  Lymphadenopathy:    He has no cervical adenopathy.  Neurological: He is alert and oriented to person, place, and time.  CN grossly intact, station and gait intact  Skin: Skin is warm and dry. No rash noted.  Psychiatric: He has a normal mood and affect. His behavior is normal. Judgment and thought content normal.  Nursing note and vitals reviewed.  Results for orders placed or performed in visit on [id number]  Basic metabolic panel  Result Value Ref Range   Sodium 137 135 - 145 mEq/L   Potassium 4.7 3.5 - 5.1 mEq/L   Chloride 103 96 - 112 mEq/L   CO2 29 19 - 32 mEq/L   Glucose, Bld 102 (H) 70 - 99 mg/dL   BUN 20 6 - 23 mg/dL   Creatinine, Ser 1.16 0.40 - 1.50 mg/dL   Calcium 9.1 8.4 - 10.5 mg/dL   GFR 66.47 >60.00 mL/min  PSA, Medicare  Result Value Ref Range   PSA 1.76 0.10 - 4.00 ng/ml  Hemoglobin A1c  Result Value Ref Range   Hgb A1c MFr Bld 5.9 4.6 - 6.5 %  Hepatitis C antibody  Result Value Ref Range   HCV Ab NEGATIVE NEGATIVE      Assessment & Plan:   Problem List Items Addressed This Visit    Hypertension    Chronic, stable. Continue amlodipine daily.       Prediabetes    Discussed a1c. Pt has implemented healthy diet and lifestyle changes to maintain glycemic control.       Advanced care planning/counseling discussion     Advanced directive: has not set up but planning to. HCPOA would be wife then daughter.       Health maintenance examination - Primary    Preventative protocols reviewed and updated unless pt declined. Discussed healthy diet and lifestyle.           Follow up plan: Return in about 1 year (around 12/30/2016), or as needed, for annual exam, prior fasting for blood work.  Ria Bush, MD

## 2015-12-31 NOTE — Assessment & Plan Note (Signed)
Discussed a1c. Pt has implemented healthy diet and lifestyle changes to maintain glycemic control.

## 2015-12-31 NOTE — Assessment & Plan Note (Addendum)
Chronic, stable. Continue amlodipine daily.

## 2015-12-31 NOTE — Patient Instructions (Addendum)
A few months prior to desired colonoscopy call us for referral to [facility].  Work on Scientist, physiological. You are doing well today. Return as needed or in 1 year for next physical.  Health Maintenance, Male A healthy lifestyle and preventative care can promote health and wellness.  Maintain regular health, dental, and eye exams.  Eat a healthy diet. Foods like vegetables, fruits, whole grains, low-fat dairy products, and lean protein foods contain the nutrients you need and are low in calories. Decrease your intake of foods high in solid fats, added sugars, and salt. Get information about a proper diet from your health care provider, if necessary.  Regular physical exercise is one of the most important things you can do for your health. Most adults should get at least 150 minutes of moderate-intensity exercise (any activity that increases your heart rate and causes you to sweat) each week. In addition, most adults need muscle-strengthening exercises on 2 or more days a week.   Maintain a healthy weight. The body mass index (BMI) is a screening tool to identify possible weight problems. It provides an estimate of body fat based on height and weight. Your health care provider can find your BMI and can help you achieve or maintain a healthy weight. For males 20 years and older:  A BMI below 18.5 is considered underweight.  A BMI of 18.5 to 24.9 is normal.  A BMI of 25 to 29.9 is considered overweight.  A BMI of 30 and above is considered obese.  Maintain normal blood lipids and cholesterol by exercising and minimizing your intake of saturated fat. Eat a balanced diet with plenty of fruits and vegetables. Blood tests for lipids and cholesterol should begin at age 70 and be repeated every 5 years. If your lipid or cholesterol levels are high, you are over age 34, or you are at high risk for heart disease, you may need your cholesterol levels checked more frequently.Ongoing high lipid and  cholesterol levels should be treated with medicines if diet and exercise are not working.  If you smoke, find out from your health care provider how to quit. If you do not use tobacco, do not start.  Lung cancer screening is recommended for adults aged 68-80 years who are at high risk for developing lung cancer because of a history of smoking. A yearly low-dose CT scan of the lungs is recommended for people who have at least a 30-pack-year history of smoking and are current smokers or have quit within the past 15 years. A pack year of smoking is smoking an average of 1 pack of cigarettes a day for 1 year (for example, a 30-pack-year history of smoking could mean smoking 1 pack a day for 30 years or 2 packs a day for 15 years). Yearly screening should continue until the smoker has stopped smoking for at least 15 years. Yearly screening should be stopped for people who develop a health problem that would prevent them from having lung cancer treatment.  If you choose to drink alcohol, do not have more than 2 drinks per day. One drink is considered to be 12 oz (360 mL) of beer, 5 oz (150 mL) of wine, or 1.5 oz (45 mL) of liquor.  Avoid the use of street drugs. Do not share needles with anyone. Ask for help if you need support or instructions about stopping the use of drugs.  High blood pressure causes heart disease and increases the risk of stroke. High blood pressure is more  likely to develop in:  People who have blood pressure in the end of the normal range (100-139/85-89 mm Hg).  People who are overweight or obese.  People who are African American.  If you are 58-30 years of age, have your blood pressure checked every 3-5 years. If you are 50 years of age or older, have your blood pressure checked every year. You should have your blood pressure measured twice--once when you are at a hospital or clinic, and once when you are not at a hospital or clinic. Record the average of the two measurements. To  check your blood pressure when you are not at a hospital or clinic, you can use:  An automated blood pressure machine at a pharmacy.  A home blood pressure monitor.  If you are 23-18 years old, ask your health care provider if you should take aspirin to prevent heart disease.  Diabetes screening involves taking a blood sample to check your fasting blood sugar level. This should be done once every 3 years after age 11 if you are at a normal weight and without risk factors for diabetes. Testing should be considered at a younger age or be carried out more frequently if you are overweight and have at least 1 risk factor for diabetes.  Colorectal cancer can be detected and often prevented. Most routine colorectal cancer screening begins at the age of 100 and continues through age 95. However, your health care provider may recommend screening at an earlier age if you have risk factors for colon cancer. On a yearly basis, your health care provider may provide home test kits to check for hidden blood in the stool. A small camera at the end of a tube may be used to directly examine the colon (sigmoidoscopy or colonoscopy) to detect the earliest forms of colorectal cancer. Talk to your health care provider about this at age 81 when routine screening begins. A direct exam of the colon should be repeated every 5-10 years through age 39, unless early forms of precancerous polyps or small growths are found.  People who are at an increased risk for hepatitis B should be screened for this virus. You are considered at high risk for hepatitis B if:  You were born in a country where hepatitis B occurs often. Talk with your health care provider about which countries are considered high risk.  Your parents were born in a high-risk country and you have not received a shot to protect against hepatitis B (hepatitis B vaccine).  You have HIV or AIDS.  You use needles to inject street drugs.  You live with, or have sex  with, someone who has hepatitis B.  You are a man who has sex with other men (MSM).  You get hemodialysis treatment.  You take certain medicines for conditions like cancer, organ transplantation, and autoimmune conditions.  Hepatitis C blood testing is recommended for all people born from [birth year] through 1965 and any individual with known risk factors for hepatitis C.  Healthy men should no longer receive prostate-specific antigen (PSA) blood tests as part of routine cancer screening. Talk to your health care provider about prostate cancer screening.  Testicular cancer screening is not recommended for adolescents or adult males who have no symptoms. Screening includes self-exam, a health care provider exam, and other screening tests. Consult with your health care provider about any symptoms you have or any concerns you have about testicular cancer.  Practice safe sex. Use condoms and avoid high-risk sexual practices  to reduce the spread of sexually transmitted infections (STIs).  You should be screened for STIs, including gonorrhea and chlamydia if:  You are sexually active and are younger than 24 years.  You are older than 24 years, and your health care provider tells you that you are at risk for this type of infection.  Your sexual activity has changed since you were last screened, and you are at an increased risk for chlamydia or gonorrhea. Ask your health care provider if you are at risk.  If you are at risk of being infected with HIV, it is recommended that you take a prescription medicine daily to prevent HIV infection. This is called pre-exposure prophylaxis (PrEP). You are considered at risk if:  You are a man who has sex with other men (MSM).  You are a heterosexual man who is sexually active with multiple partners.  You take drugs by injection.  You are sexually active with a partner who has HIV.  Talk with your health care provider about whether you are at high risk of being  infected with HIV. If you choose to begin PrEP, you should first be tested for HIV. You should then be tested every 3 months for as long as you are taking PrEP.  Use sunscreen. Apply sunscreen liberally and repeatedly throughout the day. You should seek shade when your shadow is shorter than you. Protect yourself by wearing long sleeves, pants, a wide-brimmed hat, and sunglasses year round whenever you are outdoors.  Tell your health care provider of new moles or changes in moles, especially if there is a change in shape or color. Also, tell your health care provider if a mole is larger than the size of a pencil eraser.  A one-time screening for abdominal aortic aneurysm (AAA) and surgical repair of large AAAs by ultrasound is recommended for men aged 39-75 years who are current or former smokers.  Stay current with your vaccines (immunizations).   This information is not intended to replace advice given to you by your health care provider. Make sure you discuss any questions you have with your health care provider.   Document Released: 12/16/2007 Document Revised: 07/10/2014 Document Reviewed: 11/14/2010 Elsevier Interactive Patient Education [employer].

## 2015-12-31 NOTE — Assessment & Plan Note (Signed)
Advanced directive: has not set up but planning to. HCPOA would be wife then daughter.  

## 2016-01-10 DIAGNOSIS — D101 Benign neoplasm of tongue: Secondary | ICD-10-CM | POA: Diagnosis not present

## 2016-01-10 DIAGNOSIS — K148 Other diseases of tongue: Secondary | ICD-10-CM | POA: Diagnosis not present

## 2016-02-10 DIAGNOSIS — H25813 Combined forms of age-related cataract, bilateral: Secondary | ICD-10-CM | POA: Diagnosis not present

## 2016-02-17 ENCOUNTER — Other Ambulatory Visit: Payer: Self-pay | Admitting: Family Medicine

## 2016-03-08 ENCOUNTER — Ambulatory Visit: Payer: Medicare PPO | Admitting: Podiatry

## 2016-05-30 DIAGNOSIS — L578 Other skin changes due to chronic exposure to nonionizing radiation: Secondary | ICD-10-CM | POA: Diagnosis not present

## 2016-05-30 DIAGNOSIS — C44319 Basal cell carcinoma of skin of other parts of face: Secondary | ICD-10-CM | POA: Diagnosis not present

## 2016-05-30 DIAGNOSIS — L821 Other seborrheic keratosis: Secondary | ICD-10-CM | POA: Diagnosis not present

## 2016-05-30 DIAGNOSIS — L570 Actinic keratosis: Secondary | ICD-10-CM | POA: Diagnosis not present

## 2016-05-30 DIAGNOSIS — D485 Neoplasm of uncertain behavior of skin: Secondary | ICD-10-CM | POA: Diagnosis not present

## 2016-05-30 DIAGNOSIS — L738 Other specified follicular disorders: Secondary | ICD-10-CM | POA: Diagnosis not present

## 2016-05-30 DIAGNOSIS — C44519 Basal cell carcinoma of skin of other part of trunk: Secondary | ICD-10-CM | POA: Diagnosis not present

## 2016-08-02 ENCOUNTER — Ambulatory Visit (INDEPENDENT_AMBULATORY_CARE_PROVIDER_SITE_OTHER): Payer: PPO

## 2016-08-02 DIAGNOSIS — Z23 Encounter for immunization: Secondary | ICD-10-CM | POA: Diagnosis not present

## 2016-08-07 ENCOUNTER — Ambulatory Visit (INDEPENDENT_AMBULATORY_CARE_PROVIDER_SITE_OTHER): Payer: PPO | Admitting: Family Medicine

## 2016-08-07 ENCOUNTER — Telehealth: Payer: Self-pay | Admitting: *Deleted

## 2016-08-07 ENCOUNTER — Encounter: Payer: Self-pay | Admitting: Family Medicine

## 2016-08-07 VITALS — BP 138/82 | HR 72 | Temp 98.3°F | Wt 166.0 lb

## 2016-08-07 DIAGNOSIS — Z1211 Encounter for screening for malignant neoplasm of colon: Secondary | ICD-10-CM

## 2016-08-07 DIAGNOSIS — K30 Functional dyspepsia: Secondary | ICD-10-CM | POA: Insufficient documentation

## 2016-08-07 NOTE — Telephone Encounter (Signed)
Seen today. addressed

## 2016-08-07 NOTE — Patient Instructions (Signed)
I have placed referral for colonoscopy GI symptoms likely from something you ate, now better.

## 2016-08-07 NOTE — Progress Notes (Signed)
Pre visit review using our clinic review tool, if applicable. No additional management support is needed unless otherwise documented below in the visit note. 

## 2016-08-07 NOTE — Telephone Encounter (Signed)
PT sent in the following message via MyChart. Please advise.  Appointment Request From: Aaron Brown. Aaron Brown.    With Provider: Ria Bush, MD [facility]    Preferred Date Range: Any date 08/07/2016 or later    Preferred Times: Any    Reason: To address the following health maintenance concerns.  Colonoscopy    Comments:  Dr. Danise Mina, I need a referral to [facility], Gastroenterology [phone], for the colonoscopy.   Thank you.    [name]

## 2016-08-07 NOTE — Progress Notes (Signed)
BP 138/82   Pulse 72   Temp 98.3 F (36.8 C) (Oral)   Wt 166 lb (75.3 kg)   BMI 26.00 kg/m    CC: abd discomfort Subjective:    Patient ID: Aaron Evangelist., male    DOB: 1946-04-24, 71 y.o.   MRN: [id number]  HPI: Aaron Brazzel. is a 71 y.o. male presenting on 08/07/2016 for Gastroesophageal Reflux (feels mostly better now)   Started after eating cod last week - fullness after eating without early satiety, bloating, and gassiness, indigestion with "unsettled stomach". Appetite was decreased a little.   No abd pain or cramping, nausea/vomiting, diarrhea/constipation. No bowel changes. No fevers/chills. No weight loss.   No other restaurants or new foods.   By the way - overdue for colonoscopy and requests referral to [facility] clinic.   Relevant past medical, surgical, family and social history reviewed and updated as indicated. Interim medical history since our last visit reviewed. Allergies and medications reviewed and updated. Current Outpatient Prescriptions on File Prior to Visit  Medication Sig  . amLODipine (NORVASC) 5 MG tablet TAKE 1 TABLET (5 MG TOTAL) BY MOUTH DAILY.  Marland Kitchen aspirin 81 MG tablet Take 81 mg by mouth daily.  . beta carotene w/minerals (OCUVITE) tablet Take 1 tablet by mouth daily.  . cholecalciferol (VITAMIN D) 1000 UNITS tablet Take 1,000 Units by mouth daily.   No current facility-administered medications on file prior to visit.     Review of Systems Per HPI unless specifically indicated in ROS section     Objective:    BP 138/82   Pulse 72   Temp 98.3 F (36.8 C) (Oral)   Wt 166 lb (75.3 kg)   BMI 26.00 kg/m   Wt Readings from Last 3 Encounters:  08/07/16 166 lb (75.3 kg)  12/31/15 159 lb (72.1 kg)  12/16/15 159 lb (72.1 kg)    Physical Exam  Constitutional: He is oriented to person, place, and time. He appears well-developed and well-nourished. No distress.  HENT:  Mouth/Throat: Oropharynx is clear and moist. No  oropharyngeal exudate.  Eyes: Conjunctivae and EOM are normal. Pupils are equal, round, and reactive to light. No scleral icterus.  Neck: Normal range of motion. Neck supple. No thyromegaly present.  Cardiovascular: Normal rate, regular rhythm, normal heart sounds and intact distal pulses.   No murmur heard. Pulmonary/Chest: Effort normal and breath sounds normal. No respiratory distress. He has no wheezes. He has no rales.  Abdominal: Soft. Normal appearance and bowel sounds are normal. He exhibits no distension and no mass. There is no hepatosplenomegaly. There is no tenderness. There is no rigidity, no rebound, no guarding, no CVA tenderness and negative Murphy's sign.  Musculoskeletal: He exhibits no edema.  Lymphadenopathy:    He has no cervical adenopathy.  Neurological: He is alert and oriented to person, place, and time.  Skin: Skin is warm and dry. No rash noted.  Psychiatric: He has a normal mood and affect.  Nursing note and vitals reviewed.     Assessment & Plan:   Problem List Items Addressed This Visit    Indigestion - Primary    Anticipate dietary indigestion after cod fish meal last week, now largely resolved. No red flags. Reassured patient. F/u PRN.        Other Visit Diagnoses    Special screening for malignant neoplasms, colon       Relevant Orders   Ambulatory referral to Gastroenterology       Follow  up plan: Return if symptoms worsen or fail to improve.  Ria Bush, MD

## 2016-08-07 NOTE — Assessment & Plan Note (Signed)
Anticipate dietary indigestion after cod fish meal last week, now largely resolved. No red flags. Reassured patient. F/u PRN.

## 2016-10-11 DIAGNOSIS — I10 Essential (primary) hypertension: Secondary | ICD-10-CM | POA: Diagnosis not present

## 2016-10-11 DIAGNOSIS — Z1211 Encounter for screening for malignant neoplasm of colon: Secondary | ICD-10-CM | POA: Diagnosis not present

## 2016-12-11 DIAGNOSIS — L820 Inflamed seborrheic keratosis: Secondary | ICD-10-CM | POA: Diagnosis not present

## 2016-12-11 DIAGNOSIS — L738 Other specified follicular disorders: Secondary | ICD-10-CM | POA: Diagnosis not present

## 2016-12-11 DIAGNOSIS — L570 Actinic keratosis: Secondary | ICD-10-CM | POA: Diagnosis not present

## 2016-12-11 DIAGNOSIS — L578 Other skin changes due to chronic exposure to nonionizing radiation: Secondary | ICD-10-CM | POA: Diagnosis not present

## 2016-12-11 DIAGNOSIS — Z85828 Personal history of other malignant neoplasm of skin: Secondary | ICD-10-CM | POA: Diagnosis not present

## 2016-12-11 DIAGNOSIS — Z1212 Encounter for screening for malignant neoplasm of rectum: Secondary | ICD-10-CM | POA: Diagnosis not present

## 2016-12-11 DIAGNOSIS — Z1211 Encounter for screening for malignant neoplasm of colon: Secondary | ICD-10-CM | POA: Diagnosis not present

## 2016-12-11 DIAGNOSIS — D229 Melanocytic nevi, unspecified: Secondary | ICD-10-CM | POA: Diagnosis not present

## 2016-12-11 DIAGNOSIS — L814 Other melanin hyperpigmentation: Secondary | ICD-10-CM | POA: Diagnosis not present

## 2016-12-11 DIAGNOSIS — L905 Scar conditions and fibrosis of skin: Secondary | ICD-10-CM | POA: Diagnosis not present

## 2016-12-11 DIAGNOSIS — D1800 Hemangioma unspecified site: Secondary | ICD-10-CM | POA: Diagnosis not present

## 2016-12-18 LAB — COLOGUARD: COLOGUARD: NEGATIVE

## 2016-12-27 ENCOUNTER — Other Ambulatory Visit: Payer: Self-pay | Admitting: Family Medicine

## 2016-12-27 DIAGNOSIS — R7303 Prediabetes: Secondary | ICD-10-CM

## 2016-12-27 DIAGNOSIS — I10 Essential (primary) hypertension: Secondary | ICD-10-CM

## 2016-12-27 DIAGNOSIS — Z125 Encounter for screening for malignant neoplasm of prostate: Secondary | ICD-10-CM

## 2016-12-27 DIAGNOSIS — I679 Cerebrovascular disease, unspecified: Secondary | ICD-10-CM

## 2016-12-27 DIAGNOSIS — I6789 Other cerebrovascular disease: Secondary | ICD-10-CM

## 2016-12-29 ENCOUNTER — Other Ambulatory Visit (INDEPENDENT_AMBULATORY_CARE_PROVIDER_SITE_OTHER): Payer: PPO

## 2016-12-29 DIAGNOSIS — Z125 Encounter for screening for malignant neoplasm of prostate: Secondary | ICD-10-CM

## 2016-12-29 DIAGNOSIS — I10 Essential (primary) hypertension: Secondary | ICD-10-CM

## 2016-12-29 DIAGNOSIS — R7303 Prediabetes: Secondary | ICD-10-CM

## 2016-12-29 DIAGNOSIS — I679 Cerebrovascular disease, unspecified: Secondary | ICD-10-CM | POA: Diagnosis not present

## 2016-12-29 DIAGNOSIS — I6789 Other cerebrovascular disease: Secondary | ICD-10-CM

## 2016-12-29 LAB — BASIC METABOLIC PANEL
BUN: 15 mg/dL (ref 6–23)
CHLORIDE: 105 meq/L (ref 96–112)
CO2: 30 mEq/L (ref 19–32)
CREATININE: 1.04 mg/dL (ref 0.40–1.50)
Calcium: 9 mg/dL (ref 8.4–10.5)
GFR: 75.17 mL/min (ref >60.00)
Glucose, Bld: 104 mg/dL — ABNORMAL HIGH (ref 70–99)
Potassium: 5.2 mEq/L — ABNORMAL HIGH (ref 3.5–5.1)
Sodium: 140 mEq/L (ref 135–145)

## 2016-12-29 LAB — LIPID PANEL
CHOL/HDL RATIO: 3
Cholesterol: 141 mg/dL (ref 0–200)
HDL: 47 mg/dL (ref >39.00)
LDL Cholesterol: 80 mg/dL (ref 0–99)
NonHDL: 93.87
TRIGLYCERIDES: 69 mg/dL (ref 0.0–149.0)
VLDL: 13.8 mg/dL (ref 0.0–40.0)

## 2016-12-29 LAB — PSA, MEDICARE: PSA: 2.6 ng/ml (ref 0.10–4.00)

## 2016-12-29 LAB — HEMOGLOBIN A1C: HEMOGLOBIN A1C: 6 % (ref 4.6–6.5)

## 2016-12-29 LAB — MICROALBUMIN / CREATININE URINE RATIO
Creatinine,U: 132.8 mg/dL
MICROALB/CREAT RATIO: 0.5 mg/g (ref 0.0–30.0)
Microalb, Ur: <0.7 mg/dL (ref 0.0–1.9)

## 2017-01-02 ENCOUNTER — Encounter: Payer: Self-pay | Admitting: Family Medicine

## 2017-01-02 ENCOUNTER — Ambulatory Visit (INDEPENDENT_AMBULATORY_CARE_PROVIDER_SITE_OTHER): Payer: PPO | Admitting: Family Medicine

## 2017-01-02 VITALS — BP 134/88 | HR 65 | Temp 97.9°F | Ht 67.25 in | Wt 163.5 lb

## 2017-01-02 DIAGNOSIS — Z7189 Other specified counseling: Secondary | ICD-10-CM

## 2017-01-02 DIAGNOSIS — I679 Cerebrovascular disease, unspecified: Secondary | ICD-10-CM

## 2017-01-02 DIAGNOSIS — R7303 Prediabetes: Secondary | ICD-10-CM | POA: Diagnosis not present

## 2017-01-02 DIAGNOSIS — I10 Essential (primary) hypertension: Secondary | ICD-10-CM

## 2017-01-02 DIAGNOSIS — Z0000 Encounter for general adult medical examination without abnormal findings: Secondary | ICD-10-CM

## 2017-01-02 DIAGNOSIS — I6789 Other cerebrovascular disease: Secondary | ICD-10-CM

## 2017-01-02 MED ORDER — AMLODIPINE BESYLATE 5 MG PO TABS: 5.0000 mg | ORAL_TABLET | Freq: Every day | ORAL | 3 refills | Status: DC

## 2017-01-02 NOTE — Assessment & Plan Note (Signed)

## 2017-01-02 NOTE — Assessment & Plan Note (Signed)
Advanced directive: has not set up but planning to. HCPOA would be wife then daughter.

## 2017-01-02 NOTE — Assessment & Plan Note (Signed)
Chronic, stable. Continue current regimen. 

## 2017-01-02 NOTE — Patient Instructions (Addendum)
You are doing well today.  Continue healthy diet and good hydration.  Amlodipine refilled.  Return as needed or in 1 year for next physical.   Health Maintenance, Male A healthy lifestyle and preventive care is important for your health and wellness. Ask your health care provider about what schedule of regular examinations is right for you. What should I know about weight and diet? Eat a Healthy Diet  Eat plenty of vegetables, fruits, whole grains, low-fat dairy products, and lean protein.  Do not eat a lot of foods high in solid fats, added sugars, or salt.  Maintain a Healthy Weight Regular exercise can help you achieve or maintain a healthy weight. You should:  Do at least 150 minutes of exercise each week. The exercise should increase your heart rate and make you sweat (moderate-intensity exercise).  Do strength-training exercises at least twice a week.  Watch Your Levels of Cholesterol and Blood Lipids  Have your blood tested for lipids and cholesterol every 5 years starting at 69 years of age. If you are at high risk for heart disease, you should start having your blood tested when you are 69 years old. You may need to have your cholesterol levels checked more often if: ? Your lipid or cholesterol levels are high. ? You are older than 69 years of age. ? You are at high risk for heart disease.  What should I know about cancer screening? Many types of cancers can be detected early and may often be prevented. Lung Cancer  You should be screened every year for lung cancer if: ? You are a current smoker who has smoked for at least 30 years. ? You are a former smoker who has quit within the past 15 years.  Talk to your health care provider about your screening options, when you should start screening, and how often you should be screened.  Colorectal Cancer  Routine colorectal cancer screening usually begins at 69 years of age and should be repeated every 5-10 years until you  are 69 years old. You may need to be screened more often if early forms of precancerous polyps or small growths are found. Your health care provider may recommend screening at an earlier age if you have risk factors for colon cancer.  Your health care provider may recommend using home test kits to check for hidden blood in the stool.  A small camera at the end of a tube can be used to examine your colon (sigmoidoscopy or colonoscopy). This checks for the earliest forms of colorectal cancer.  Prostate and Testicular Cancer  Depending on your age and overall health, your health care provider may do certain tests to screen for prostate and testicular cancer.  Talk to your health care provider about any symptoms or concerns you have about testicular or prostate cancer.  Skin Cancer  Check your skin from head to toe regularly.  Tell your health care provider about any new moles or changes in moles, especially if: ? There is a change in a mole's size, shape, or color. ? You have a mole that is larger than a pencil eraser.  Always use sunscreen. Apply sunscreen liberally and repeat throughout the day.  Protect yourself by wearing long sleeves, pants, a wide-brimmed hat, and sunglasses when outside.  What should I know about heart disease, diabetes, and high blood pressure?  If you are 47-21 years of age, have your blood pressure checked every 3-5 years. If you are 96 years of age  or older, have your blood pressure checked every year. You should have your blood pressure measured twice-once when you are at a hospital or clinic, and once when you are not at a hospital or clinic. Record the average of the two measurements. To check your blood pressure when you are not at a hospital or clinic, you can use: ? An automated blood pressure machine at a pharmacy. ? A home blood pressure monitor.  Talk to your health care provider about your target blood pressure.  If you are between 67-14 years old,  ask your health care provider if you should take aspirin to prevent heart disease.  Have regular diabetes screenings by checking your fasting blood sugar level. ? If you are at a normal weight and have a low risk for diabetes, have this test once every three years after the age of 65. ? If you are overweight and have a high risk for diabetes, consider being tested at a younger age or more often.  A one-time screening for abdominal aortic aneurysm (AAA) by ultrasound is recommended for men aged 41-75 years who are current or former smokers. What should I know about preventing infection? Hepatitis B If you have a higher risk for hepatitis B, you should be screened for this virus. Talk with your health care provider to find out if you are at risk for hepatitis B infection. Hepatitis C Blood testing is recommended for:  Everyone born from [birth year] through 1965.  Anyone with known risk factors for hepatitis C.  Sexually Transmitted Diseases (STDs)  You should be screened each year for STDs including gonorrhea and chlamydia if: ? You are sexually active and are younger than 69 years of age. ? You are older than 69 years of age and your health care provider tells you that you are at risk for this type of infection. ? Your sexual activity has changed since you were last screened and you are at an increased risk for chlamydia or gonorrhea. Ask your health care provider if you are at risk.  Talk with your health care provider about whether you are at high risk of being infected with HIV. Your health care provider may recommend a prescription medicine to help prevent HIV infection.  What else can I do?  Schedule regular health, dental, and eye exams.  Stay current with your vaccines (immunizations).  Do not use any tobacco products, such as cigarettes, chewing tobacco, and e-cigarettes. If you need help quitting, ask your health care provider.  Limit alcohol intake to no more than 2 drinks per  day. One drink equals 12 ounces of beer, 5 ounces of wine, or 1 ounces of hard liquor.  Do not use street drugs.  Do not share needles.  Ask your health care provider for help if you need support or information about quitting drugs.  Tell your health care provider if you often feel depressed.  Tell your health care provider if you have ever been abused or do not feel safe at home. This information is not intended to replace advice given to you by your health care provider. Make sure you discuss any questions you have with your health care provider. Document Released: 12/16/2007 Document Revised: 02/16/2016 Document Reviewed: 03/23/2015 Elsevier Interactive Patient Education  [employer].

## 2017-01-02 NOTE — Assessment & Plan Note (Signed)
Continue aspirin 81 mg daily.   

## 2017-01-02 NOTE — Assessment & Plan Note (Signed)
Reviewed A1c - rec continue avoiding added sugars in diet.

## 2017-01-02 NOTE — Progress Notes (Signed)
BP 134/88   Pulse 65   Temp 97.9 F (36.6 C) (Oral)   Ht 5' 7.25" (1.708 m)   Wt 163 lb 8 oz (74.2 kg)   SpO2 98%   BMI 25.42 kg/m    CC: Medicare wellness visit Subjective:    Patient ID: Aaron Brown., male    DOB: 07/16/1945, 71 y.o.   MRN: [id number]  HPI: Verdis Koval. is a 71 y.o. male presenting on 01/02/2017 for [facility] Wellness   Did not see Katha Cabal this year for medicare wellness visit. Vision Screening Comments: Dr Lorie Apley 02/2016 No depression, anhedonia.  No falls in the past year.  Preventative: COLONOSCOPY Date: 05/2005 1 polyp, int hem, told ok to rpt 10 yrs ([facility]).  Cologuard WNL 12/2016 Prostate screening - desired yearly. Flu shot declines  Pneumovax 2014, prevnar 12/2013  Tdap 12/2011  Zostavax 2013  Shingrix - declines Advanced directive: has not set up but planning to. HCPOA would be wife then daughter.  Seat belt use discussed Sunscreen use discussed - no changing moles. Sees dermatologist regularly Non smoker Alcohol - seldom  Lives with wife  Grown children  Occupation: was Government social research officer, now owns landscaping business  Edu: Sports administrator  Activity: stays active at work, walking at home, back exercises 30 min every night.  Diet: good water, fruits/vegetables some   Relevant past medical, surgical, family and social history reviewed and updated as indicated. Interim medical history since our last visit reviewed. Allergies and medications reviewed and updated. Outpatient Medications Prior to Visit  Medication Sig Dispense Refill  . aspirin 81 MG tablet Take 81 mg by mouth daily.    . beta carotene w/minerals (OCUVITE) tablet Take 1 tablet by mouth daily.    . cholecalciferol (VITAMIN D) 1000 UNITS tablet Take 1,000 Units by mouth daily.    Marland Kitchen amLODipine (NORVASC) 5 MG tablet TAKE 1 TABLET (5 MG TOTAL) BY MOUTH DAILY. 90 tablet 3   No facility-administered medications prior to visit.      Per HPI unless specifically  indicated in ROS section below Review of Systems  Constitutional: Negative for activity change, appetite change, chills, fatigue, fever and unexpected weight change.  HENT: Negative for hearing loss.   Eyes: Negative for visual disturbance.  Respiratory: Negative for cough, chest tightness, shortness of breath and wheezing.   Cardiovascular: Negative for chest pain, palpitations and leg swelling.  Gastrointestinal: Negative for abdominal distention, abdominal pain, blood in stool, constipation, diarrhea, nausea and vomiting.  Genitourinary: Negative for difficulty urinating and hematuria.  Musculoskeletal: Negative for arthralgias, myalgias and neck pain.  Skin: Negative for rash.  Neurological: Negative for dizziness, seizures, syncope and headaches.  Hematological: Negative for adenopathy. Does not bruise/bleed easily.  Psychiatric/Behavioral: Negative for dysphoric mood. The patient is not nervous/anxious.        Objective:    BP 134/88   Pulse 65   Temp 97.9 F (36.6 C) (Oral)   Ht 5' 7.25" (1.708 m)   Wt 163 lb 8 oz (74.2 kg)   SpO2 98%   BMI 25.42 kg/m   Wt Readings from Last 3 Encounters:  01/02/17 163 lb 8 oz (74.2 kg)  08/07/16 166 lb (75.3 kg)  12/31/15 159 lb (72.1 kg)    Physical Exam  Constitutional: He is oriented to person, place, and time. He appears well-developed and well-nourished. No distress.  HENT:  Head: Normocephalic and atraumatic.  Right Ear: Hearing, tympanic membrane, external ear and ear canal normal.  Left Ear: Hearing, tympanic membrane, external ear and ear canal normal.  Nose: Nose normal.  Mouth/Throat: Uvula is midline, oropharynx is clear and moist and mucous membranes are normal. No oropharyngeal exudate, posterior oropharyngeal edema or posterior oropharyngeal erythema.  Eyes: Conjunctivae and EOM are normal. Pupils are equal, round, and reactive to light. No scleral icterus.  Neck: Normal range of motion. Neck supple. No thyromegaly  present.  Cardiovascular: Normal rate, regular rhythm, normal heart sounds and intact distal pulses.   No murmur heard. Pulses:      Radial pulses are 2+ on the right side, and 2+ on the left side.  Pulmonary/Chest: Effort normal and breath sounds normal. No respiratory distress. He has no wheezes. He has no rales.  Abdominal: Soft. Bowel sounds are normal. He exhibits no distension and no mass. There is no tenderness. There is no rebound and no guarding.  Musculoskeletal: Normal range of motion. He exhibits no edema.  Lymphadenopathy:    He has no cervical adenopathy.  Neurological: He is alert and oriented to person, place, and time.  CN grossly intact, station and gait intact Recall 3/3 Calculation 5/5 serial 7s  Skin: Skin is warm and dry. No rash noted.  Psychiatric: He has a normal mood and affect. His behavior is normal. Judgment and thought content normal.  Nursing note and vitals reviewed.  Results for orders placed or performed in visit on 12/29/16  Lipid panel  Result Value Ref Range   Cholesterol 141 0 - 200 mg/dL   Triglycerides 69.0 0.0 - 149.0 mg/dL   HDL 47.00 >39.00 mg/dL   VLDL 13.8 0.0 - 40.0 mg/dL   LDL Cholesterol 80 0 - 99 mg/dL   Total CHOL/HDL Ratio 3    NonHDL 27.74   Basic metabolic panel  Result Value Ref Range   Sodium 140 135 - 145 mEq/L   Potassium 5.2 (H) 3.5 - 5.1 mEq/L   Chloride 105 96 - 112 mEq/L   CO2 30 19 - 32 mEq/L   Glucose, Bld 104 (H) 70 - 99 mg/dL   BUN 15 6 - 23 mg/dL   Creatinine, Ser 1.04 0.40 - 1.50 mg/dL   Calcium 9.0 8.4 - 10.5 mg/dL   GFR 75.17 >60.00 mL/min  Hemoglobin A1c  Result Value Ref Range   Hgb A1c MFr Bld 6.0 4.6 - 6.5 %  PSA, Medicare  Result Value Ref Range   PSA 2.60 0.10 - 4.00 ng/ml  Microalbumin / creatinine urine ratio  Result Value Ref Range   Microalb, Ur <0.7 0.0 - 1.9 mg/dL   Creatinine,U 132.8 mg/dL   Microalb Creat Ratio 0.5 0.0 - 30.0 mg/g      Assessment & Plan:   Problem List Items  Addressed This Visit    Advanced care planning/counseling discussion    Advanced directive: has not set up but planning to. HCPOA would be wife then daughter.       Cerebral microvascular disease    Continue aspirin 81mg  daily.       Relevant Medications   amLODipine (NORVASC) 5 MG tablet   Health maintenance examination    Preventative protocols reviewed and updated unless pt declined. Discussed healthy diet and lifestyle.       Hypertension    Chronic, stable. Continue current regimen.       Relevant Medications   amLODipine (NORVASC) 5 MG tablet   Medicare annual wellness visit, subsequent - Primary    I have personally reviewed the Medicare Annual Wellness questionnaire  and have noted 1. The patient's medical and social history 2. Their use of alcohol, tobacco or illicit drugs 3. Their current medications and supplements 4. The patient's functional ability including ADL's, fall risks, home safety risks and hearing or visual impairment. Cognitive function has been assessed and addressed as indicated.  5. Diet and physical activity 6. Evidence for depression or mood disorders The patients weight, height, BMI have been recorded in the chart. I have made referrals, counseling and provided education to the patient based on review of the above and I have provided the pt with a written personalized care plan for preventive services. Provider list updated.. See scanned questionairre as needed for further documentation. Reviewed preventative protocols and updated unless pt declined.       Prediabetes    Reviewed A1c - rec continue avoiding added sugars in diet.           Follow up plan: Return in about 1 year (around 01/02/2018) for annual exam, prior fasting for blood work, medicare wellness visit.  Ria Bush, MD

## 2017-01-02 NOTE — Assessment & Plan Note (Signed)
Preventative protocols reviewed and updated unless pt declined. Discussed healthy diet and lifestyle.  

## 2017-05-15 DIAGNOSIS — H25813 Combined forms of age-related cataract, bilateral: Secondary | ICD-10-CM | POA: Diagnosis not present

## 2017-09-04 ENCOUNTER — Telehealth: Payer: Self-pay | Admitting: Family Medicine

## 2017-09-04 NOTE — Telephone Encounter (Signed)
Copied from [place]. Topic: Quick Communication - Rx Refill/Question >> Sep 04, 2017 10:12 AM Bea Graff, NT wrote: Medication: etodolac (LODINE) and cyclobenzaprine (FLEXERIL)    Has the patient contacted their pharmacy? Yes.     (Agent: If no, request that the patient contact the pharmacy for the refill.)   Preferred Pharmacy (with phone number or street name): [facility] [address]. Pt states he used these a few years ago for his back but is needing them now for his upper left back pain.   Agent: Please be advised that RX refills may take up to 3 business days. We ask that you follow-up with your pharmacy.

## 2017-09-05 MED ORDER — CYCLOBENZAPRINE HCL 10 MG PO TABS: 10.0000 mg | ORAL_TABLET | Freq: Three times a day (TID) | ORAL | 0 refills | Status: DC | PRN

## 2017-09-05 MED ORDER — ETODOLAC 400 MG PO TABS: 400.0000 mg | ORAL_TABLET | Freq: Two times a day (BID) | ORAL | 1 refills | Status: DC

## 2017-09-05 NOTE — Telephone Encounter (Signed)
Do not see medications on pt. Medication list. Please advise.

## 2017-09-05 NOTE — Telephone Encounter (Signed)
Med sent in.

## 2017-09-06 ENCOUNTER — Telehealth: Payer: Self-pay

## 2017-09-06 NOTE — Telephone Encounter (Signed)
I spoke with Ilona Sorrel pharmacist at [facility] and they did received the cyclobenzaprine rx but PA is required. Ilona Sorrel will fax over PA request. Per DPR pt notified by v/m info above.

## 2017-09-06 NOTE — Telephone Encounter (Signed)
plz notify PA decision and offer he buy out of pocket.

## 2017-09-06 NOTE — Telephone Encounter (Signed)
Received faxed PA denial stating request is for a non- medically- accepted indication:  Chronic painful musculoskeletal condition.

## 2017-09-06 NOTE — Telephone Encounter (Signed)
Started PA for cyclobenzaprine 10 mg tab, key:  [id number].  Decision pending.

## 2017-09-06 NOTE — Telephone Encounter (Signed)
Copied from [place] [id number]. Topic: General - Other >> Sep 06, 2017  9:24 AM Yvette Rack wrote: Reason for CRM: patient calling stating that the pharmacy didn't receive the  cyclobenzaprine (FLEXERIL) 10 MG

## 2017-09-07 NOTE — Telephone Encounter (Signed)
Left message on vm per dpr relaying message per Dr. G.  

## 2017-09-11 DIAGNOSIS — L578 Other skin changes due to chronic exposure to nonionizing radiation: Secondary | ICD-10-CM | POA: Diagnosis not present

## 2017-09-11 DIAGNOSIS — Z85828 Personal history of other malignant neoplasm of skin: Secondary | ICD-10-CM | POA: Diagnosis not present

## 2017-09-11 DIAGNOSIS — D1800 Hemangioma unspecified site: Secondary | ICD-10-CM | POA: Diagnosis not present

## 2017-09-11 DIAGNOSIS — L570 Actinic keratosis: Secondary | ICD-10-CM | POA: Diagnosis not present

## 2017-09-11 DIAGNOSIS — L853 Xerosis cutis: Secondary | ICD-10-CM | POA: Diagnosis not present

## 2017-10-15 DIAGNOSIS — L570 Actinic keratosis: Secondary | ICD-10-CM | POA: Diagnosis not present

## 2017-11-14 DIAGNOSIS — L570 Actinic keratosis: Secondary | ICD-10-CM | POA: Diagnosis not present

## 2018-01-07 ENCOUNTER — Ambulatory Visit (INDEPENDENT_AMBULATORY_CARE_PROVIDER_SITE_OTHER): Payer: PPO

## 2018-01-07 ENCOUNTER — Other Ambulatory Visit: Payer: Self-pay | Admitting: Family Medicine

## 2018-01-07 ENCOUNTER — Ambulatory Visit: Payer: PPO

## 2018-01-07 VITALS — BP 124/80 | HR 69 | Temp 98.1°F | Ht 67.0 in | Wt 164.8 lb

## 2018-01-07 DIAGNOSIS — Z125 Encounter for screening for malignant neoplasm of prostate: Secondary | ICD-10-CM

## 2018-01-07 DIAGNOSIS — Z0000 Encounter for general adult medical examination without abnormal findings: Secondary | ICD-10-CM | POA: Diagnosis not present

## 2018-01-07 DIAGNOSIS — I10 Essential (primary) hypertension: Secondary | ICD-10-CM | POA: Diagnosis not present

## 2018-01-07 DIAGNOSIS — R7303 Prediabetes: Secondary | ICD-10-CM

## 2018-01-07 LAB — LIPID PANEL
CHOL/HDL RATIO: 3
CHOLESTEROL: 130 mg/dL (ref 0–200)
HDL: 51.7 mg/dL (ref >39.00)
LDL CALC: 64 mg/dL (ref 0–99)
NONHDL: 78.11
Triglycerides: 69 mg/dL (ref 0.0–149.0)
VLDL: 13.8 mg/dL (ref 0.0–40.0)

## 2018-01-07 LAB — BASIC METABOLIC PANEL
BUN: 13 mg/dL (ref 6–23)
CHLORIDE: 101 meq/L (ref 96–112)
CO2: 29 mEq/L (ref 19–32)
Calcium: 9.3 mg/dL (ref 8.4–10.5)
Creatinine, Ser: 1.11 mg/dL (ref 0.40–1.50)
GFR: 69.52 mL/min (ref >60.00)
GLUCOSE: 99 mg/dL (ref 70–99)
POTASSIUM: 4.7 meq/L (ref 3.5–5.1)
Sodium: 137 mEq/L (ref 135–145)

## 2018-01-07 LAB — MICROALBUMIN / CREATININE URINE RATIO
Creatinine,U: 66.7 mg/dL
Microalb Creat Ratio: 1 mg/g (ref 0.0–30.0)
Microalb, Ur: <0.7 mg/dL (ref 0.0–1.9)

## 2018-01-07 LAB — HEMOGLOBIN A1C: Hgb A1c MFr Bld: 6 % (ref 4.6–6.5)

## 2018-01-07 LAB — PSA, MEDICARE: PSA: 3 ng/mL (ref 0.10–4.00)

## 2018-01-07 NOTE — Progress Notes (Signed)
PCP notes:   Health maintenance:  Colon cancer screening - pt will bring cologuard results to CPE (note: unable to locate on lab's website)  Abnormal screenings:   None  Patient concerns:   None  Nurse concerns:  None  Next PCP appt:   01/08/2018 @ 0830

## 2018-01-07 NOTE — Patient Instructions (Addendum)
Aaron Brown , Thank you for taking time to come for your Medicare Wellness Visit. I appreciate your ongoing commitment to your health goals. Please review the following plan we discussed and let me know if I can assist you in the future.   These are the goals we discussed: Goals    . Patient Stated     Starting 01/07/2018, I will continue to take medications as prescribed.        This is a list of the screening recommended for you and due dates:  Health Maintenance  Topic Date Due  . Cologuard (Stool DNA test)  2021 (exact date to be determined)  . Flu Shot  01/31/2018  . DTaP/Tdap/Td vaccine (2 - Td) 12/26/2021  . Tetanus Vaccine  12/26/2021  .  Hepatitis C: One time screening is recommended by Center for Disease Control  (CDC) for  adults born from [birth year] through 1963.   Completed  . Pneumonia vaccines  Completed     Preventive Care for Adults  A healthy lifestyle and preventive care can promote health and wellness. Preventive health guidelines for adults include the following key practices.  . A routine yearly physical is a good way to check with your health care provider about your health and preventive screening. It is a chance to share any concerns and updates on your health and to receive a thorough exam.  . Visit your dentist for a routine exam and preventive care every 6 months. Brush your teeth twice a day and floss once a day. Good oral hygiene prevents tooth decay and gum disease.  . The frequency of eye exams is based on your age, health, family medical history, use  of contact lenses, and other factors. Follow your health care provider's recommendations for frequency of eye exams.  . Eat a healthy diet. Foods like vegetables, fruits, whole grains, low-fat dairy products, and lean protein foods contain the nutrients you need without too many calories. Decrease your intake of foods high in solid fats, added sugars, and salt. Eat the right amount of calories for you. Get  information about a proper diet from your health care provider, if necessary.  . Regular physical exercise is one of the most important things you can do for your health. Most adults should get at least 150 minutes of moderate-intensity exercise (any activity that increases your heart rate and causes you to sweat) each week. In addition, most adults need muscle-strengthening exercises on 2 or more days a week.  Silver Sneakers may be a benefit available to you. To determine eligibility, you may visit the website: www.silversneakers.com or contact program at 561-853-9146 Mon-Fri between 8AM-8PM.   . Maintain a healthy weight. The body mass index (BMI) is a screening tool to identify possible weight problems. It provides an estimate of body fat based on height and weight. Your health care provider can find your BMI and can help you achieve or maintain a healthy weight.   For adults 20 years and older: ? A BMI below 18.5 is considered underweight. ? A BMI of 18.5 to 24.9 is normal. ? A BMI of 25 to 29.9 is considered overweight. ? A BMI of 30 and above is considered obese.   . Maintain normal blood lipids and cholesterol levels by exercising and minimizing your intake of saturated fat. Eat a balanced diet with plenty of fruit and vegetables. Blood tests for lipids and cholesterol should begin at age 77 and be repeated every 5 years. If your lipid  or cholesterol levels are high, you are over 50, or you are at high risk for heart disease, you may need your cholesterol levels checked more frequently. Ongoing high lipid and cholesterol levels should be treated with medicines if diet and exercise are not working.  . If you smoke, find out from your health care provider how to quit. If you do not use tobacco, please do not start.  . If you choose to drink alcohol, please do not consume more than 2 drinks per day. One drink is considered to be 12 ounces (355 mL) of beer, 5 ounces (148 mL) of wine, or 1.5  ounces (44 mL) of liquor.  . If you are 32-46 years old, ask your health care provider if you should take aspirin to prevent strokes.  . Use sunscreen. Apply sunscreen liberally and repeatedly throughout the day. You should seek shade when your shadow is shorter than you. Protect yourself by wearing long sleeves, pants, a wide-brimmed hat, and sunglasses year round, whenever you are outdoors.  . Once a month, do a whole body skin exam, using a mirror to look at the skin on your back. Tell your health care provider of new moles, moles that have irregular borders, moles that are larger than a pencil eraser, or moles that have changed in shape or color.

## 2018-01-07 NOTE — Addendum Note (Signed)
Addended by: Ria Bush on: 01/07/2018 07:26 AM   Modules accepted: Orders

## 2018-01-07 NOTE — Progress Notes (Signed)
Subjective:   Aaron Brown. is a 71 y.o. male who presents for Medicare Annual/Subsequent preventive examination.  Review of Systems:  N/A Cardiac Risk Factors include: advanced age (>72men, >81 women);male gender;hypertension     Objective:    Vitals: BP 124/80 (BP Location: Right Arm, Patient Position: Sitting, Cuff Size: Normal)   Pulse 69   Temp 98.1 F (36.7 C) (Oral)   Ht 5\' 7"  (1.702 m) Comment: no shoes  Wt 164 lb 12 oz (74.7 kg)   SpO2 97%   BMI 25.80 kg/m   Body mass index is 25.8 kg/m.  Advanced Directives 01/07/2018 12/16/2015  Does Patient Have a Medical Advance Directive? No;Yes Yes  Type of Paramedic of [place];Living will [place]  Does patient want to make changes to medical advance directive? - No - Patient declined  Copy of [place] in Chart? Yes No - copy requested  Would patient like information on creating a medical advance directive? No - Patient declined -    Tobacco Social History   Tobacco Use  Smoking Status Never Smoker  Smokeless Tobacco Never Used     Counseling given: No   Clinical Intake:  Pre-visit preparation completed: Yes  Pain : No/denies pain Pain Score: 0-No pain     Nutritional Status: BMI 25 -29 Overweight Nutritional Risks: None Diabetes: No  How often do you need to have someone help you when you read instructions, pamphlets, or other written materials from your doctor or pharmacy?: 1 - Never What is the last grade level you completed in school?: Bachelors degree  Interpreter Needed?: No  Comments: pt lives with spouse Information entered by :: LPinson, LPN  Past Medical History:  Diagnosis Date  . Asymmetrical left sensorineural hearing loss 12/2013   s/p MRI pending hearing aides [name])  . Blood in stool    in the past; ? hemorrhoids  . Cerebral microvascular disease 12/2013   by MRI, referred to neuro by ENT  . Colon polyp  05/2005   ?hyperplastic  . History of chicken pox   . Hypertension   . Increased pressure in the eye    Dr. Gloriann Loan  . Lower back pain    bulging disc with spinal stenosis L4/5 s/p surgery x2  . Prediabetes    A1c 6.0 (2012)  . Seasonal allergies   . Squamous acanthoma of face   . Squamous cell cancer of skin of hand    Past Surgical History:  Procedure Laterality Date  . BUNIONECTOMY  2003   Dr. Barkley Bruns  . COLONOSCOPY  05/2005   1 polyp, int hem   . DOBUTAMINE STRESS ECHO  2013   normal per prior PCP records  . hemi-laminectomy  2000   Dr. Annette Stable, Dr. Hal Neer L4/L5  . LAMINECTOMY  1988   Dr. Pearlie Oyster; L5  . SQUAMOUS CELL CARCINOMA EXCISION  2014   face and hand   Family History  Problem Relation Age of Onset  . Hypertension Mother   . Arthritis Father   . Hypertension Father   . Alcohol abuse Maternal Grandfather   . Cancer Maternal Aunt        colon  . Cancer Cousin        colon  . Cancer Cousin        breast   Social History   Socioeconomic History  . Marital status: Married    Spouse name: Not on file  . Number of children:  Not on file  . Years of education: Not on file  . Highest education level: Not on file  Occupational History  . Not on file  Social Needs  . Financial resource strain: Not on file  . Food insecurity:    Worry: Not on file    Inability: Not on file  . Transportation needs:    Medical: Not on file    Non-medical: Not on file  Tobacco Use  . Smoking status: Never Smoker  . Smokeless tobacco: Never Used  Substance and Sexual Activity  . Alcohol use: Yes    Alcohol/week: 0.0 oz    Comment: Occasional-wine  . Drug use: No  . Sexual activity: Yes  Lifestyle  . Physical activity:    Days per week: Not on file    Minutes per session: Not on file  . Stress: Not on file  Relationships  . Social connections:    Talks on phone: Not on file    Gets together: Not on file    Attends religious service: Not on file    Active member of  club or organization: Not on file    Attends meetings of clubs or organizations: Not on file    Relationship status: Not on file  Other Topics Concern  . Not on file  Social History Narrative   Lives with wife   grown children   Occupation: was Government social research officer, now owns landscaping business   Edu: College BA   Activity: stays active at work   Diet: good water, fruits/vegetables some    Outpatient Encounter Medications as of 01/07/2018  Medication Sig  . amLODipine (NORVASC) 5 MG tablet Take 1 tablet (5 mg total) by mouth daily.  . beta carotene w/minerals (OCUVITE) tablet Take 1 tablet by mouth daily.  . cholecalciferol (VITAMIN D) 1000 UNITS tablet Take 1,000 Units by mouth daily.  . cyclobenzaprine (FLEXERIL) 10 MG tablet Take 1 tablet (10 mg total) by mouth 3 (three) times daily as needed for muscle spasms.  Marland Kitchen etodolac (LODINE) 400 MG tablet Take 1 tablet (400 mg total) by mouth 2 (two) times daily after a meal. (Patient taking differently: Take 400 mg by mouth as needed. )  . aspirin 81 MG tablet Take 81 mg by mouth daily.   No facility-administered encounter medications on file as of 01/07/2018.     Activities of Daily Living In your present state of health, do you have any difficulty performing the following activities: 01/07/2018  Hearing? Y  Vision? Y  Difficulty concentrating or making decisions? N  Walking or climbing stairs? N  Dressing or bathing? N  Doing errands, shopping? N  Preparing Food and eating ? N  Using the Toilet? N  In the past six months, have you accidently leaked urine? N  Do you have problems with loss of bowel control? N  Managing your Medications? N  Managing your Finances? N  Housekeeping or managing your Housekeeping? N  Some recent data might be hidden    Patient Care Team: Ria Bush, MD as PCP - General (Family Medicine) Lorelee Cover., MD as Referring Physician (Ophthalmology) Eugenie Birks, Paschal Dopp., MD as Referring Physician  (Dentistry) Brendolyn Patty, MD as Referring Physician (Dermatology) Garrel Ridgel, [place] as Consulting Physician (Podiatry)   Assessment:   This is a routine wellness examination for [employer].  Hearing Screening Comments: Bilateral hearing aids Vision Screening Comments: Fall 2018 with Dr. Gloriann Loan   Exercise Activities and Dietary recommendations Current Exercise Habits: Home exercise  routine, Type of exercise: walking;calisthenics, Time (Minutes): > 60(90 minutes), Frequency (Times/Week): 7, Weekly Exercise (Minutes/Week): 0, Intensity: Moderate, Exercise limited by: None identified  Goals    . Patient Stated     Starting 01/07/2018, I will continue to take medications as prescribed.        Fall Risk Fall Risk  01/07/2018 01/02/2017 12/16/2015 12/14/2014 12/03/2013  Falls in the past year? No No No No No   Depression Screen PHQ 2/9 Scores 01/07/2018 01/02/2017 12/16/2015 12/14/2014  PHQ - 2 Score 0 0 0 0  PHQ- 9 Score 0 - - -    Cognitive Function MMSE - Mini Mental State Exam 01/07/2018 12/16/2015  Orientation to time 5 5  Orientation to Place 5 5  Registration 3 3  Attention/ Calculation 0 0  Recall 3 3  Language- name 2 objects 0 0  Language- repeat 1 1  Language- follow 3 step command 3 3  Language- read & follow direction 0 0  Write a sentence 0 0  Copy design 0 0  Total score 20 20     PLEASE NOTE: A Mini-Cog screen was completed. Maximum score is 20. A value of 0 denotes this part of Folstein MMSE was not completed or the patient failed this part of the Mini-Cog screening.   Mini-Cog Screening Orientation to Time - Max 5 pts Orientation to Place - Max 5 pts Registration - Max 3 pts Recall - Max 3 pts Language Repeat - Max 1 pts Language Follow 3 Step Command - Max 3 pts     Immunization History  Administered Date(s) Administered  . Influenza,inj,Quad PF,6+ Mos 08/02/2016  . Pneumococcal Conjugate-13 12/03/2013  . Pneumococcal Polysaccharide-23 11/11/2012  . Tdap 12/27/2011    . Zoster 07/04/2011   Screening Tests Health Maintenance  Topic Date Due  . Fecal DNA (Cologuard)  01/31/2019 (Originally 08/01/1997)  . INFLUENZA VACCINE  01/31/2018  . DTaP/Tdap/Td (2 - Td) 12/26/2021  . TETANUS/TDAP  12/26/2021  . Hepatitis C Screening  Completed  . PNA vac Low Risk Adult  Completed     Plan:     I have personally reviewed, addressed, and noted the following in the patient's chart:  A. Medical and social history B. Use of alcohol, tobacco or illicit drugs  C. Current medications and supplements D. Functional ability and status E.  Nutritional status F.  Physical activity G. Advance directives H. List of other physicians I.  Hospitalizations, surgeries, and ER visits in previous 12 months J.  [place] to include hearing, vision, cognitive, depression L. Referrals and appointments - none  In addition, I have reviewed and discussed with patient certain preventive protocols, quality metrics, and best practice recommendations. A written personalized care plan for preventive services as well as general preventive health recommendations were provided to patient.  See attached scanned questionnaire for additional information.   Signed,   Lindell Noe, MHA, BS, LPN Health Coach

## 2018-01-08 ENCOUNTER — Ambulatory Visit (INDEPENDENT_AMBULATORY_CARE_PROVIDER_SITE_OTHER): Payer: PPO | Admitting: Family Medicine

## 2018-01-08 ENCOUNTER — Encounter: Payer: Self-pay | Admitting: Family Medicine

## 2018-01-08 VITALS — BP 136/78 | HR 69 | Temp 98.2°F | Ht 67.0 in | Wt 164.8 lb

## 2018-01-08 DIAGNOSIS — M5442 Lumbago with sciatica, left side: Secondary | ICD-10-CM | POA: Diagnosis not present

## 2018-01-08 DIAGNOSIS — I499 Cardiac arrhythmia, unspecified: Secondary | ICD-10-CM | POA: Diagnosis not present

## 2018-01-08 DIAGNOSIS — R7303 Prediabetes: Secondary | ICD-10-CM | POA: Diagnosis not present

## 2018-01-08 DIAGNOSIS — R011 Cardiac murmur, unspecified: Secondary | ICD-10-CM

## 2018-01-08 DIAGNOSIS — Z7189 Other specified counseling: Secondary | ICD-10-CM | POA: Diagnosis not present

## 2018-01-08 DIAGNOSIS — I10 Essential (primary) hypertension: Secondary | ICD-10-CM

## 2018-01-08 DIAGNOSIS — I340 Nonrheumatic mitral (valve) insufficiency: Secondary | ICD-10-CM | POA: Insufficient documentation

## 2018-01-08 DIAGNOSIS — G8929 Other chronic pain: Secondary | ICD-10-CM | POA: Diagnosis not present

## 2018-01-08 DIAGNOSIS — Z0000 Encounter for general adult medical examination without abnormal findings: Secondary | ICD-10-CM | POA: Diagnosis not present

## 2018-01-08 MED ORDER — ETODOLAC 400 MG PO TABS: 400.0000 mg | ORAL_TABLET | Freq: Two times a day (BID) | ORAL | 1 refills | Status: DC | PRN

## 2018-01-08 MED ORDER — CYCLOBENZAPRINE HCL 10 MG PO TABS: 10.0000 mg | ORAL_TABLET | Freq: Two times a day (BID) | ORAL | 1 refills | Status: DC | PRN

## 2018-01-08 NOTE — Assessment & Plan Note (Addendum)
Advanced directive: has not set up but planning to do this. HCPOA would be wife then daughter.

## 2018-01-08 NOTE — Patient Instructions (Addendum)
Try to get cologuard report for your and our records.  EKG today and we will schedule you for heart ultrasound to evaluate for new murmur.  You are doing well today Return as needed or in 1 year for next wellness visit and physical.  Health Maintenance, Male A healthy lifestyle and preventive care is important for your health and wellness. Ask your health care provider about what schedule of regular examinations is right for you. What should I know about weight and diet? Eat a Healthy Diet  Eat plenty of vegetables, fruits, whole grains, low-fat dairy products, and lean protein.  Do not eat a lot of foods high in solid fats, added sugars, or salt.  Maintain a Healthy Weight Regular exercise can help you achieve or maintain a healthy weight. You should:  Do at least 150 minutes of exercise each week. The exercise should increase your heart rate and make you sweat (moderate-intensity exercise).  Do strength-training exercises at least twice a week.  Watch Your Levels of Cholesterol and Blood Lipids  Have your blood tested for lipids and cholesterol every 5 years starting at 70 years of age. If you are at high risk for heart disease, you should start having your blood tested when you are 70 years old. You may need to have your cholesterol levels checked more often if: ? Your lipid or cholesterol levels are high. ? You are older than 70 years of age. ? You are at high risk for heart disease.  What should I know about cancer screening? Many types of cancers can be detected early and may often be prevented. Lung Cancer  You should be screened every year for lung cancer if: ? You are a current smoker who has smoked for at least 30 years. ? You are a former smoker who has quit within the past 15 years.  Talk to your health care provider about your screening options, when you should start screening, and how often you should be screened.  Colorectal Cancer  Routine colorectal cancer  screening usually begins at 70 years of age and should be repeated every 5-10 years until you are 70 years old. You may need to be screened more often if early forms of precancerous polyps or small growths are found. Your health care provider may recommend screening at an earlier age if you have risk factors for colon cancer.  Your health care provider may recommend using home test kits to check for hidden blood in the stool.  A small camera at the end of a tube can be used to examine your colon (sigmoidoscopy or colonoscopy). This checks for the earliest forms of colorectal cancer.  Prostate and Testicular Cancer  Depending on your age and overall health, your health care provider may do certain tests to screen for prostate and testicular cancer.  Talk to your health care provider about any symptoms or concerns you have about testicular or prostate cancer.  Skin Cancer  Check your skin from head to toe regularly.  Tell your health care provider about any new moles or changes in moles, especially if: ? There is a change in a mole's size, shape, or color. ? You have a mole that is larger than a pencil eraser.  Always use sunscreen. Apply sunscreen liberally and repeat throughout the day.  Protect yourself by wearing long sleeves, pants, a wide-brimmed hat, and sunglasses when outside.  What should I know about heart disease, diabetes, and high blood pressure?  If you are 18-39  years of age, have your blood pressure checked every 3-5 years. If you are 70 years of age or older, have your blood pressure checked every year. You should have your blood pressure measured twice-once when you are at a hospital or clinic, and once when you are not at a hospital or clinic. Record the average of the two measurements. To check your blood pressure when you are not at a hospital or clinic, you can use: ? An automated blood pressure machine at a pharmacy. ? A home blood pressure monitor.  Talk to your  health care provider about your target blood pressure.  If you are between 23-74 years old, ask your health care provider if you should take aspirin to prevent heart disease.  Have regular diabetes screenings by checking your fasting blood sugar level. ? If you are at a normal weight and have a low risk for diabetes, have this test once every three years after the age of 67. ? If you are overweight and have a high risk for diabetes, consider being tested at a younger age or more often.  A one-time screening for abdominal aortic aneurysm (AAA) by ultrasound is recommended for men aged 64-75 years who are current or former smokers. What should I know about preventing infection? Hepatitis B If you have a higher risk for hepatitis B, you should be screened for this virus. Talk with your health care provider to find out if you are at risk for hepatitis B infection. Hepatitis C Blood testing is recommended for:  Everyone born from [birth year] through 1963.  Anyone with known risk factors for hepatitis C.  Sexually Transmitted Diseases (STDs)  You should be screened each year for STDs including gonorrhea and chlamydia if: ? You are sexually active and are younger than 70 years of age. ? You are older than 70 years of age and your health care provider tells you that you are at risk for this type of infection. ? Your sexual activity has changed since you were last screened and you are at an increased risk for chlamydia or gonorrhea. Ask your health care provider if you are at risk.  Talk with your health care provider about whether you are at high risk of being infected with HIV. Your health care provider may recommend a prescription medicine to help prevent HIV infection.  What else can I do?  Schedule regular health, dental, and eye exams.  Stay current with your vaccines (immunizations).  Do not use any tobacco products, such as cigarettes, chewing tobacco, and e-cigarettes. If you need help  quitting, ask your health care provider.  Limit alcohol intake to no more than 2 drinks per day. One drink equals 12 ounces of beer, 5 ounces of wine, or 1 ounces of hard liquor.  Do not use street drugs.  Do not share needles.  Ask your health care provider for help if you need support or information about quitting drugs.  Tell your health care provider if you often feel depressed.  Tell your health care provider if you have ever been abused or do not feel safe at home. This information is not intended to replace advice given to you by your health care provider. Make sure you discuss any questions you have with your health care provider. Document Released: 12/16/2007 Document Revised: 02/16/2016 Document Reviewed: 03/23/2015 Elsevier Interactive Patient Education  [employer].

## 2018-01-08 NOTE — Progress Notes (Addendum)
BP 136/78 (BP Location: Left Arm, Patient Position: Sitting, Cuff Size: Normal)   Pulse 69   Temp 98.2 F (36.8 C) (Oral)   Ht 5\' 7"  (1.702 m)   Wt 164 lb 12 oz (74.7 kg)   SpO2 97%   BMI 25.80 kg/m    CC: CPE Subjective:    Patient ID: Aaron Evangelist., male    DOB: Jun 27, 1946, 72 y.o.   MRN: [id number]  HPI: Aaron Rog. is a 72 y.o. male presenting on 01/08/2018 for Annual Exam (Pt 2. Wants to discuss ASA. Pt states he had Cologuard done after 10/11/16 but does not have records. Will sign med release form at checkout to reuqest. )   Annia Belt last week for medicare wellness visit. Note reviewed.   Preventative: COLONOSCOPY Date: 05/2005 1 polyp, int hem, told ok to rpt 10 yrs ([facility]).  Cologuard WNL 12/2016 Prostate screening - desired yearly.  Flu shot declines  Pneumovax 2014, prevnar 12/2013  Tdap 12/2011  Zostavax 2013  Shingrix - declines  Advanced directive: has not set up but planning to do this. HCPOA would be wife then daughter.  Seat belt use discussed Sunscreen use discussed - no changing moles. Sees dermatologist regularly Non smoker Alcohol - seldom Dentist - Q6 mo Eye exam yearly  Lives with wife  Grown children  Occupation: was Government social research officer, now owns landscaping business  Edu: College BA  Activity: stays active at work, walking 2 mi/day, back exercises 30 min every night.  Diet: good water, fruits/vegetables some. Follows mediterranean diet.   Relevant past medical, surgical, family and social history reviewed and updated as indicated. Interim medical history since our last visit reviewed. Allergies and medications reviewed and updated. Outpatient Medications Prior to Visit  Medication Sig Dispense Refill  . amLODipine (NORVASC) 5 MG tablet Take 1 tablet (5 mg total) by mouth daily. 90 tablet 3  . beta carotene w/minerals (OCUVITE) tablet Take 1 tablet by mouth daily.    . cholecalciferol (VITAMIN D) 1000 UNITS tablet Take  1,000 Units by mouth daily.    . cyclobenzaprine (FLEXERIL) 10 MG tablet Take 1 tablet (10 mg total) by mouth 3 (three) times daily as needed for muscle spasms. 40 tablet 0  . etodolac (LODINE) 400 MG tablet Take 1 tablet (400 mg total) by mouth 2 (two) times daily after a meal. (Patient taking differently: Take 400 mg by mouth as needed. ) 30 tablet 1  . aspirin 81 MG tablet Take 81 mg by mouth daily.     No facility-administered medications prior to visit.      Per HPI unless specifically indicated in ROS section below Review of Systems  Constitutional: Negative for activity change, appetite change, chills, fatigue, fever and unexpected weight change.  HENT: Negative for hearing loss.   Eyes: Negative for visual disturbance.  Respiratory: Negative for cough, chest tightness, shortness of breath and wheezing.   Cardiovascular: Negative for chest pain, palpitations and leg swelling.  Gastrointestinal: Negative for abdominal distention, abdominal pain, blood in stool, constipation, diarrhea, nausea and vomiting.  Genitourinary: Negative for difficulty urinating and hematuria.  Musculoskeletal: Negative for arthralgias, myalgias and neck pain.  Skin: Negative for rash.  Neurological: Negative for dizziness, seizures, syncope and headaches.  Hematological: Negative for adenopathy. Bruises/bleeds easily.  Psychiatric/Behavioral: Negative for dysphoric mood. The patient is not nervous/anxious.        Objective:    BP 136/78 (BP Location: Left Arm, Patient Position: Sitting, Cuff Size: Normal)  Pulse 69   Temp 98.2 F (36.8 C) (Oral)   Ht 5\' 7"  (1.702 m)   Wt 164 lb 12 oz (74.7 kg)   SpO2 97%   BMI 25.80 kg/m   Wt Readings from Last 3 Encounters:  01/08/18 164 lb 12 oz (74.7 kg)  01/07/18 164 lb 12 oz (74.7 kg)  01/02/17 163 lb 8 oz (74.2 kg)    Physical Exam  Constitutional: He is oriented to person, place, and time. He appears well-developed and well-nourished. No distress.    HENT:  Head: Normocephalic and atraumatic.  Right Ear: Hearing, tympanic membrane, external ear and ear canal normal.  Left Ear: Hearing, tympanic membrane, external ear and ear canal normal.  Nose: Nose normal.  Mouth/Throat: Uvula is midline, oropharynx is clear and moist and mucous membranes are normal. No oropharyngeal exudate, posterior oropharyngeal edema or posterior oropharyngeal erythema.  Eyes: Pupils are equal, round, and reactive to light. Conjunctivae and EOM are normal. No scleral icterus.  Neck: Normal range of motion. Neck supple. Carotid bruit is not present. No thyromegaly present.  Cardiovascular: Normal rate and intact distal pulses. An irregular rhythm present.  Murmur (3-7/1 systolic at apex) heard. Pulses:      Radial pulses are 2+ on the right side, and 2+ on the left side.  ?trigeminy  Pulmonary/Chest: Effort normal and breath sounds normal. No respiratory distress. He has no wheezes. He has no rales.  Abdominal: Soft. Bowel sounds are normal. He exhibits no distension and no mass. There is no tenderness. There is no rebound and no guarding.  Musculoskeletal: Normal range of motion. He exhibits no edema.  Lymphadenopathy:    He has no cervical adenopathy.  Neurological: He is alert and oriented to person, place, and time.  CN grossly intact, station and gait intact  Skin: Skin is warm and dry. No rash noted.  Psychiatric: He has a normal mood and affect. His behavior is normal. Judgment and thought content normal.  Nursing note and vitals reviewed.  Results for orders placed or performed in visit on 01/08/18  Cologuard  Result Value Ref Range   Cologuard Negative    EKG - NSR rate 60s, LAD with LAFB, normal intervals, T flattening inferiorly, no acute ST/T changes, good R wave progression, new LAFB otherwise unchanged from prior EKG 2013    Assessment & Plan:   Problem List Items Addressed This Visit    Systolic murmur    Marked murmur heard today at apex  ?MVP - will check EKG and echo to further evaluate - denies current symptoms.       Relevant Orders   EKG 12-Lead (Completed)   ECHOCARDIOGRAM COMPLETE   Prediabetes    Chronic, stable. Reviewed avoiding added sugars in diet.       Lower back pain    Known bulging disc and spinal stenosis s/p surgeries in the past, manages with lodine and flexeril - refilled today.       Relevant Medications   cyclobenzaprine (FLEXERIL) 10 MG tablet   etodolac (LODINE) 400 MG tablet   Irregular heart beat    Possible trigeminy on cardiac exam today - check EKG       Relevant Orders   EKG 12-Lead (Completed)   ECHOCARDIOGRAM COMPLETE   Hypertension    Chronic, stable. Continue amlodipine 5mg  daily.       Health maintenance examination - Primary    Preventative protocols reviewed and updated unless pt declined. Discussed healthy diet and lifestyle.  Advanced care planning/counseling discussion    Advanced directive: has not set up but planning to do this. HCPOA would be wife then daughter.           Meds ordered this encounter  Medications  . cyclobenzaprine (FLEXERIL) 10 MG tablet    Sig: Take 1 tablet (10 mg total) by mouth 2 (two) times daily as needed for muscle spasms.    Dispense:  40 tablet    Refill:  1  . etodolac (LODINE) 400 MG tablet    Sig: Take 1 tablet (400 mg total) by mouth 2 (two) times daily as needed.    Dispense:  40 tablet    Refill:  1   Orders Placed This Encounter  Procedures  . Cologuard    This external order was created through the Results Console.  . EKG 12-Lead  . ECHOCARDIOGRAM COMPLETE    Standing Status:   Future    Standing Expiration Date:   04/13/2019    Order Specific Question:   Where should this test be performed    Answer:   [facility]    Order Specific Question:   Perflutren DEFINITY (image enhancing agent) should be administered unless hypersensitivity or allergy exist    Answer:   Administer Perflutren    Order Specific  Question:   Expected Date:    Answer:   1 month    Follow up plan: Return in about 1 year (around 01/09/2019) for annual exam, prior fasting for blood work, medicare wellness visit.  Ria Bush, MD

## 2018-01-08 NOTE — Assessment & Plan Note (Signed)
Preventative protocols reviewed and updated unless pt declined. Discussed healthy diet and lifestyle.  

## 2018-01-10 NOTE — Assessment & Plan Note (Signed)
Chronic, stable. Reviewed avoiding added sugars in diet.

## 2018-01-10 NOTE — Assessment & Plan Note (Signed)
Chronic, stable. Continue amlodipine 5mg daily. 

## 2018-01-10 NOTE — Assessment & Plan Note (Signed)
Known bulging disc and spinal stenosis s/p surgeries in the past, manages with lodine and flexeril - refilled today.

## 2018-01-10 NOTE — Assessment & Plan Note (Signed)
Marked murmur heard today at apex ?MVP - will check EKG and echo to further evaluate - denies current symptoms.

## 2018-01-10 NOTE — Assessment & Plan Note (Signed)
Possible trigeminy on cardiac exam today - check EKG

## 2018-01-10 NOTE — Addendum Note (Signed)
Addended by: Ria Bush on: 01/10/2018 12:59 PM   Modules accepted: Orders

## 2018-01-17 ENCOUNTER — Other Ambulatory Visit: Payer: Self-pay

## 2018-01-17 ENCOUNTER — Ambulatory Visit (INDEPENDENT_AMBULATORY_CARE_PROVIDER_SITE_OTHER): Payer: PPO

## 2018-01-17 DIAGNOSIS — I499 Cardiac arrhythmia, unspecified: Secondary | ICD-10-CM

## 2018-01-17 DIAGNOSIS — R011 Cardiac murmur, unspecified: Secondary | ICD-10-CM | POA: Diagnosis not present

## 2018-01-18 ENCOUNTER — Encounter: Payer: Self-pay | Admitting: Family Medicine

## 2018-02-01 NOTE — Progress Notes (Signed)
I reviewed health advisor's note, was available for consultation, and agree with documentation and plan.  

## 2018-02-10 ENCOUNTER — Other Ambulatory Visit: Payer: Self-pay | Admitting: Family Medicine

## 2018-03-12 DIAGNOSIS — D485 Neoplasm of uncertain behavior of skin: Secondary | ICD-10-CM | POA: Diagnosis not present

## 2018-03-12 DIAGNOSIS — L570 Actinic keratosis: Secondary | ICD-10-CM | POA: Diagnosis not present

## 2018-03-12 DIAGNOSIS — Z85828 Personal history of other malignant neoplasm of skin: Secondary | ICD-10-CM | POA: Diagnosis not present

## 2018-03-12 DIAGNOSIS — L578 Other skin changes due to chronic exposure to nonionizing radiation: Secondary | ICD-10-CM | POA: Diagnosis not present

## 2018-03-12 DIAGNOSIS — C44619 Basal cell carcinoma of skin of left upper limb, including shoulder: Secondary | ICD-10-CM | POA: Diagnosis not present

## 2018-03-12 DIAGNOSIS — L738 Other specified follicular disorders: Secondary | ICD-10-CM | POA: Diagnosis not present

## 2018-04-22 DIAGNOSIS — C44619 Basal cell carcinoma of skin of left upper limb, including shoulder: Secondary | ICD-10-CM | POA: Diagnosis not present

## 2018-04-22 DIAGNOSIS — L578 Other skin changes due to chronic exposure to nonionizing radiation: Secondary | ICD-10-CM | POA: Diagnosis not present

## 2018-06-03 DIAGNOSIS — H40003 Preglaucoma, unspecified, bilateral: Secondary | ICD-10-CM | POA: Diagnosis not present

## 2018-06-05 DIAGNOSIS — H903 Sensorineural hearing loss, bilateral: Secondary | ICD-10-CM | POA: Diagnosis not present

## 2018-06-21 ENCOUNTER — Emergency Department
Admission: EM | Admit: 2018-06-21 | Discharge: 2018-06-21 | Disposition: A | Discharge status: Home / Self Care | Payer: PPO | Attending: Emergency Medicine | Admitting: Emergency Medicine

## 2018-06-21 ENCOUNTER — Other Ambulatory Visit: Payer: Self-pay

## 2018-06-21 DIAGNOSIS — M542 Cervicalgia: Secondary | ICD-10-CM | POA: Diagnosis not present

## 2018-06-21 DIAGNOSIS — Z79899 Other long term (current) drug therapy: Secondary | ICD-10-CM | POA: Diagnosis not present

## 2018-06-21 DIAGNOSIS — M436 Torticollis: Secondary | ICD-10-CM

## 2018-06-21 DIAGNOSIS — I10 Essential (primary) hypertension: Secondary | ICD-10-CM | POA: Insufficient documentation

## 2018-06-21 MED ORDER — ORPHENADRINE CITRATE 30 MG/ML IJ SOLN
60.0000 mg | Freq: Once | INTRAMUSCULAR | Status: AC
  Administered 2018-06-21: 60 mg via INTRAMUSCULAR
  Filled 2018-06-21: qty 2

## 2018-06-21 MED ORDER — DIAZEPAM 5 MG PO TABS
5.0000 mg | ORAL_TABLET | Freq: Once | ORAL | Status: AC
  Administered 2018-06-21: 5 mg via ORAL
  Filled 2018-06-21: qty 1

## 2018-06-21 MED ORDER — DEXAMETHASONE SODIUM PHOSPHATE 10 MG/ML IJ SOLN
10.0000 mg | Freq: Once | INTRAMUSCULAR | Status: AC
  Administered 2018-06-21: 10 mg via INTRAMUSCULAR
  Filled 2018-06-21: qty 1

## 2018-06-21 MED ORDER — MELOXICAM 15 MG PO TABS: 15.0000 mg | ORAL_TABLET | Freq: Every day | ORAL | 0 refills | Status: DC

## 2018-06-21 MED ORDER — METHOCARBAMOL 500 MG PO TABS: 500.0000 mg | ORAL_TABLET | Freq: Four times a day (QID) | ORAL | 0 refills | Status: DC

## 2018-06-21 NOTE — ED Notes (Signed)
Pt states when pain started he woke sleeping on his L side which usually causes him neck pain and he has a PMH of cervical discs bulging.

## 2018-06-21 NOTE — ED Provider Notes (Signed)
[facility] Emergency Department Provider Note  ____________________________________________  Time seen: Approximately 10:19 PM  I have reviewed the triage vital signs and the nursing notes.   HISTORY  Chief Complaint Neck Pain    HPI Aaron Brown. is a 72 y.o. male who presents the emergency department complaining of neck pain and stiffness.  Patient reports that he has a history of bulging disc in his cervical spine.  He reports that he has a position that if he sleeps on that side he causes him pain and many times muscle spasms.  Patient reports that he typically can feel it during his sleep, awakes and changes positions.   At this time, he does not think that he was able to switch positions during the night.  Patient has had increasing pain, stiffness.  Stiffness began on left side, now includes right.  No radicular symptoms.  Patient endorses occipital headache which he describes as a "pulling" sensation to the posterior skull.  No other complaints at this time.  Patient has been taking etodolac and Flexeril without significant improvement.   Past Medical History:  Diagnosis Date  . Asymmetrical left sensorineural hearing loss 12/2013   s/p MRI pending hearing aides [name])  . Blood in stool    in the past; ? hemorrhoids  . Cerebral microvascular disease 12/2013   by MRI, referred to neuro by ENT  . Colon polyp 05/2005   ?hyperplastic  . History of chicken pox   . Hypertension   . Increased pressure in the eye    Dr. Gloriann Loan  . Lower back pain    bulging disc with spinal stenosis L4/5 s/p surgery x2  . Prediabetes    A1c 6.0 (2012)  . Seasonal allergies   . Squamous acanthoma of face   . Squamous cell cancer of skin of hand     Patient Active Problem List   Diagnosis Date Noted  . Mitral valve regurgitation 01/08/2018  . Irregular heart beat 01/08/2018  . Advanced care planning/counseling discussion 12/14/2014  . Health maintenance  examination 12/14/2014  . Medicare annual wellness visit, subsequent 12/03/2013  . Asymmetrical left sensorineural hearing loss 12/01/2013  . Cerebral microvascular disease 12/01/2013  . History of nonmelanoma skin cancer 07/30/2013  . Hypertension   . Seasonal allergies   . Increased pressure in the eye   . Lower back pain   . Prediabetes   . Chest pain 02/29/2012    Past Surgical History:  Procedure Laterality Date  . BUNIONECTOMY  2003   Dr. Barkley Bruns  . COLONOSCOPY  05/2005   1 polyp, int hem   . DOBUTAMINE STRESS ECHO  2013   normal per prior PCP records  . hemi-laminectomy  2000   Dr. Annette Stable, Dr. Hal Neer L4/L5  . LAMINECTOMY  1988   Dr. Pearlie Oyster; L5  . SQUAMOUS CELL CARCINOMA EXCISION  2014   face and hand    Prior to Admission medications   Medication Sig Start Date End Date Taking? Authorizing Provider  amLODipine (NORVASC) 5 MG tablet TAKE 1 TABLET BY MOUTH EVERY DAY 02/11/18   Ria Bush, MD  beta carotene w/minerals (OCUVITE) tablet Take 1 tablet by mouth daily.    [provider]  cholecalciferol (VITAMIN D) 1000 UNITS tablet Take 1,000 Units by mouth daily.    [provider]  cyclobenzaprine (FLEXERIL) 10 MG tablet Take 1 tablet (10 mg total) by mouth 2 (two) times daily as needed for muscle spasms. 01/08/18   Danise Mina,  Garlon Hatchet, MD  etodolac (LODINE) 400 MG tablet Take 1 tablet (400 mg total) by mouth 2 (two) times daily as needed. 01/08/18   Ria Bush, MD    Allergies Lisinopril  Family History  Problem Relation Age of Onset  . Hypertension Mother   . Arthritis Father   . Hypertension Father   . Alcohol abuse Maternal Grandfather   . Cancer Maternal Aunt        colon  . Cancer Cousin        colon  . Cancer Cousin        breast    Social History Social History   Tobacco Use  . Smoking status: Never Smoker  . Smokeless tobacco: Never Used  Substance Use Topics  . Alcohol use: Yes    Alcohol/week: 0.0 standard drinks     Comment: Occasional-wine  . Drug use: No     Review of Systems  Constitutional: No fever/chills Eyes: No visual changes. No discharge ENT: No upper respiratory complaints. Cardiovascular: no chest pain. Respiratory: no cough. No SOB. Gastrointestinal: No abdominal pain.  No nausea, no vomiting.  No diarrhea.  No constipation. Genitourinary: Negative for dysuria. No hematuria Musculoskeletal: Positive for nontraumatic neck pain and stiffness. Skin: Negative for rash, abrasions, lacerations, ecchymosis. Neurological: Negative for headaches, focal weakness or numbness. 10-point ROS otherwise negative.  ____________________________________________   PHYSICAL EXAM:  VITAL SIGNS: ED Triage Vitals  Enc Vitals Group     BP 06/21/18 2134 (!) 147/87     Pulse Rate 06/21/18 2134 97     Resp 06/21/18 2134 17     Temp 06/21/18 2134 97.7 F (36.5 C)     Temp Source 06/21/18 2134 Core     SpO2 06/21/18 2134 98 %     Weight 06/21/18 2135 165 lb (74.8 kg)     Height 06/21/18 2135 5\' 7"  (1.702 m)     Head Circumference --      Peak Flow --      Pain Score 06/21/18 2135 8     Pain Loc --      Pain Edu? --      Excl. in [place]? --      Constitutional: Alert and oriented. Well appearing and in no acute distress. Eyes: Conjunctivae are normal. PERRL. EOMI. Head: Atraumatic. Neck: No stridor.  No midline cervical spine tenderness to palpation.  Moderate tenderness to palpation over bilateral paraspinal muscle groups, mostly the superior distribution.  Palpable spasms are appreciated along bilateral paraspinal and trapezius muscles.  No palpable abnormality of the cervical spine.  Radial pulse intact bilateral upper extremities.  Sensation intact and equal in all dermatomal distributions bilateral upper extremities.  Cardiovascular: Normal rate, regular rhythm. Normal S1 and S2.  Good peripheral circulation. Respiratory: Normal respiratory effort without tachypnea or retractions. Lungs CTAB.  Good air entry to the bases with no decreased or absent breath sounds. Musculoskeletal: Full range of motion to all extremities. No gross deformities appreciated. Neurologic:  Normal speech and language. No gross focal neurologic deficits are appreciated.  Skin:  Skin is warm, dry and intact. No rash noted. Psychiatric: Mood and affect are normal. Speech and behavior are normal. Patient exhibits appropriate insight and judgement.   ____________________________________________   LABS (all labs ordered are listed, but only abnormal results are displayed)  Labs Reviewed - No data to display ____________________________________________  EKG   ____________________________________________  RADIOLOGY   No results found.  ____________________________________________    PROCEDURES  Procedure(s) performed:  Procedures    Medications - No data to display   ____________________________________________   INITIAL IMPRESSION / ASSESSMENT AND PLAN / ED COURSE  Pertinent labs & imaging results that were available during my care of the patient were reviewed by me and considered in my medical decision making (see chart for details).  Review of the [place] CSRS was performed in accordance of the [place] prior to dispensing any controlled drugs.      Patient's diagnosis is consistent with torticollis.  Patient presents emergency department with neck pain and stiffness.  Differential included torticollis, worsening cervical disc disease, cervical radiculopathy, meningitis.  On exam, findings are consistent with torticollis.  Patient reports that this does occur after sleeping in a wrong position.  Patient has been taking etodolac and Flexeril x2 days without significant improvement.  Patient will be transition to meloxicam and Robaxin at home.  Patient is above the age of 39 but is able to take NSAID therapy.  Here in the emergency department, patient will be given injection of Decadron,  Norflex, oral Valium for muscle relaxation.  Follow-up with primary care as needed..Patient is given ED precautions to return to the ED for any worsening or new symptoms.     ____________________________________________  FINAL CLINICAL IMPRESSION(S) / ED DIAGNOSES  Final diagnoses:  None      NEW MEDICATIONS STARTED DURING THIS VISIT:  ED Discharge Orders    None          This chart was dictated using voice recognition software/Dragon. Despite best efforts to proofread, errors can occur which can change the meaning. Any change was purely unintentional.    Darletta Moll, PA-C 06/21/18 2249    Orbie Pyo, MD 06/21/18 [phone]

## 2018-06-21 NOTE — ED Triage Notes (Signed)
Patient c/o neck pain X 2 days. Patient denies injury. Patient reports he took etodolac, flexeril with no relief. Patient reports he has tried alternating ice/heat with no relief.

## 2018-07-25 ENCOUNTER — Encounter: Payer: Self-pay | Admitting: Internal Medicine

## 2018-07-25 ENCOUNTER — Ambulatory Visit (INDEPENDENT_AMBULATORY_CARE_PROVIDER_SITE_OTHER): Payer: PPO | Admitting: Internal Medicine

## 2018-07-25 VITALS — BP 132/78 | HR 76 | Temp 98.0°F | Wt 171.0 lb

## 2018-07-25 DIAGNOSIS — J069 Acute upper respiratory infection, unspecified: Secondary | ICD-10-CM

## 2018-07-25 DIAGNOSIS — B9789 Other viral agents as the cause of diseases classified elsewhere: Secondary | ICD-10-CM

## 2018-07-25 MED ORDER — METHYLPREDNISOLONE ACETATE 80 MG/ML IJ SUSP
80.0000 mg | Freq: Once | INTRAMUSCULAR | Status: AC
  Administered 2018-07-25: 80 mg via INTRAMUSCULAR

## 2018-07-25 NOTE — Progress Notes (Signed)
HPI  Pt presents to the clinic today with c/o headache, ear fullness, sore throat and cough. He reports this started 3-4 days ago. The headache is located in the forehead. He describes the pain as pressure. He denies dizziness, lightheadedness or visual changes. He denies ear pain but does have some decreased hearing at baseline, not any worse. He denies difficulty swallowing. The cough is mostly non productive. He denies runny nose, nasal congestion or shortness of breath. He denies fever, chills or body aches. He has tried Zicam, Mucinex and cough syrup with some relief. He has no history of allergies or asthma. He has not had sick contacts that he is aware of.  Review of Systems      Past Medical History:  Diagnosis Date  . Asymmetrical left sensorineural hearing loss 12/2013   s/p MRI pending hearing aides [name])  . Blood in stool    in the past; ? hemorrhoids  . Cerebral microvascular disease 12/2013   by MRI, referred to neuro by ENT  . Colon polyp 05/2005   ?hyperplastic  . History of chicken pox   . Hypertension   . Increased pressure in the eye    Dr. Gloriann Loan  . Lower back pain    bulging disc with spinal stenosis L4/5 s/p surgery x2  . Prediabetes    A1c 6.0 (2012)  . Seasonal allergies   . Squamous acanthoma of face   . Squamous cell cancer of skin of hand     Family History  Problem Relation Age of Onset  . Hypertension Mother   . Arthritis Father   . Hypertension Father   . Alcohol abuse Maternal Grandfather   . Cancer Maternal Aunt        colon  . Cancer Cousin        colon  . Cancer Cousin        breast    Social History   Socioeconomic History  . Marital status: Married    Spouse name: Not on file  . Number of children: Not on file  . Years of education: Not on file  . Highest education level: Not on file  Occupational History  . Not on file  Social Needs  . Financial resource strain: Not on file  . Food insecurity:    Worry: Not on file   Inability: Not on file  . Transportation needs:    Medical: Not on file    Non-medical: Not on file  Tobacco Use  . Smoking status: Never Smoker  . Smokeless tobacco: Never Used  Substance and Sexual Activity  . Alcohol use: Yes    Alcohol/week: 0.0 standard drinks    Comment: Occasional-wine  . Drug use: No  . Sexual activity: Yes  Lifestyle  . Physical activity:    Days per week: Not on file    Minutes per session: Not on file  . Stress: Not on file  Relationships  . Social connections:    Talks on phone: Not on file    Gets together: Not on file    Attends religious service: Not on file    Active member of club or organization: Not on file    Attends meetings of clubs or organizations: Not on file    Relationship status: Not on file  . Intimate partner violence:    Fear of current or ex partner: Not on file    Emotionally abused: Not on file    Physically abused: Not on file  Forced sexual activity: Not on file  Other Topics Concern  . Not on file  Social History Narrative   Lives with wife   grown children   Occupation: was Government social research officer, now owns landscaping business   Edu: Sports administrator   Activity: stays active at work   Diet: good water, fruits/vegetables some    Allergies  Allergen Reactions  . Lisinopril     May have caused lip swelling     Constitutional: Positive headache. Denies fatigue, fever or abrupt weight changes.  HEENT:  Positive ear fullness, sore throat. Denies eye redness, eye pain, pressure behind the eyes, facial pain, nasal congestion, ear pain, ringing in the ears, wax buildup, runny nose or bloody nose. Respiratory: Positive cough. Denies difficulty breathing or shortness of breath.  Cardiovascular: Denies chest pain, chest tightness, palpitations or swelling in the hands or feet.   No other specific complaints in a complete review of systems (except as listed in HPI above).  Objective:   BP 132/78   Pulse 76   Temp 98 F (36.7  C) (Oral)   Wt 171 lb (77.6 kg)   SpO2 97%   BMI 26.78 kg/m   Wt Readings from Last 3 Encounters:  06/21/18 165 lb (74.8 kg)  01/08/18 164 lb 12 oz (74.7 kg)  01/07/18 164 lb 12 oz (74.7 kg)     General: Appears his stated age, well developed, well nourished in NAD. HEENT: Head: normal shape and size, no sinus tenderness noted; Eyes: sclera white, no icterus, conjunctiva pink; Ears: Tm's gray and intact, normal light reflex; Nose: mucosa pink and moist, septum midline; Throat/Mouth: + PND. Teeth present, mucosa erythematous and moist, no exudate noted, no lesions or ulcerations noted.  Neck: No cervical lymphadenopathy.  Cardiovascular: Normal rate and rhythm. S1,S2 noted.  No murmur, rubs or gallops noted.  Pulmonary/Chest: Normal effort and positive vesicular breath sounds. No respiratory distress. No wheezes, rales or ronchi noted.       Assessment & Plan:   Viral Upper Respiratory Infection with Cough:  Get some rest and drink plenty of water Do salt water gargles for the sore throat 80 mg Depo IM today Start Allegra OTC Continue OTC cough syrup  RTC as needed or if symptoms persist.   Webb Silversmith, NP

## 2018-07-25 NOTE — Patient Instructions (Signed)

## 2018-07-30 ENCOUNTER — Encounter: Payer: Self-pay | Admitting: Family Medicine

## 2018-07-30 ENCOUNTER — Ambulatory Visit (INDEPENDENT_AMBULATORY_CARE_PROVIDER_SITE_OTHER): Payer: PPO | Admitting: Family Medicine

## 2018-07-30 VITALS — BP 160/90 | HR 90 | Temp 98.6°F | Ht 67.0 in | Wt 168.8 lb

## 2018-07-30 DIAGNOSIS — I10 Essential (primary) hypertension: Secondary | ICD-10-CM

## 2018-07-30 DIAGNOSIS — J019 Acute sinusitis, unspecified: Secondary | ICD-10-CM | POA: Diagnosis not present

## 2018-07-30 MED ORDER — AMOXICILLIN-POT CLAVULANATE 875-125 MG PO TABS: 1.0000 | ORAL_TABLET | Freq: Two times a day (BID) | ORAL | 0 refills | Status: AC

## 2018-07-30 NOTE — Patient Instructions (Addendum)
You have a sinus infection. May still be viral.  Push fluids and rest.  Nasal saline irrigation or neti pot to help drain sinuses. May use plain mucinex (guaifenesin) with plenty of fluid to help mobilize mucous. May use flonase nasal steroid.  Continue OTC cough syrup.  If fever, sinus pressure/facial pain or congestion peristing past next 2-3 days, fill antibiotic printed out today.  Keep an eye on blood pressures at home. Let me know if consistently >140/90. Avoid decongestants (because they raise blood pressure)

## 2018-07-30 NOTE — Assessment & Plan Note (Signed)
Story/exam consistent with sinusitis, however endorses symptoms may be improving over last 24-48 hours. WASP for augmentin provided today with indications when to fill. Further supportive care as per instructions.

## 2018-07-30 NOTE — Assessment & Plan Note (Addendum)
BP markedly elevated today - in setting of recent decongestant use. Discussed need to avoid decongestants. He will monitor BP at home ensuring levels improve, will let me know if persistently >140/90.

## 2018-07-30 NOTE — Progress Notes (Signed)
BP (!) 160/90 (BP Location: Right Arm, Patient Position: Sitting, Cuff Size: Normal)   Pulse 90   Temp 98.6 F (37 C) (Oral)   Ht 5\' 7"  (1.702 m)   Wt 168 lb 12 oz (76.5 kg)   SpO2 95%   BMI 26.43 kg/m   BP Readings from Last 3 Encounters:  07/30/18 (!) 160/90  07/25/18 132/78  06/21/18 133/84  staying elevated on rpt testing  CC: fever Subjective:    Patient ID: Aaron Evangelist., male    DOB: 08/28/1945, 73 y.o.   MRN: [id number]  HPI: Aaron Gilreath. is a 73 y.o. male presenting on 07/30/2018 for Fever (Had fever yesterday, max- 99.4. Also, c/o body aches and cough. Sxs started about 8 days ago. Tried OTC cold, sinus and cough meds. ) and Nasal Congestion (C/o sinus congestions and pressure. )   8d h/o dry cough, PNdrainage, abd soreness from cough, now developing facial pain and sinus pressure, earache. Feverish yesterday (99.4).  No chills, tooth pain, ST, dyspnea or wheezing.   Using wash cloth with menthyl vapor, tylenol/pseudoephedrine. Also tried Human resources officer.  No sick contacts at home. No smokers at home. No h/o asthma.  BP elevated.  Less active recently.   Saw Regina with similar symptoms last week with dx viral URI treated with IM methylprednisolone. rec start allegra at that time.      Relevant past medical, surgical, family and social history reviewed and updated as indicated. Interim medical history since our last visit reviewed. Allergies and medications reviewed and updated. Outpatient Medications Prior to Visit  Medication Sig Dispense Refill  . amLODipine (NORVASC) 5 MG tablet TAKE 1 TABLET BY MOUTH EVERY DAY 90 tablet 3  . beta carotene w/minerals (OCUVITE) tablet Take 1 tablet by mouth daily.    . cholecalciferol (VITAMIN D) 1000 UNITS tablet Take 1,000 Units by mouth daily.    . cyclobenzaprine (FLEXERIL) 10 MG tablet Take 1 tablet (10 mg total) by mouth 2 (two) times daily as needed for muscle spasms. 40 tablet 1  . etodolac (LODINE) 400 MG  tablet Take 1 tablet (400 mg total) by mouth 2 (two) times daily as needed. 40 tablet 1  . Multiple Vitamin (MULTIVITAMIN) tablet Take 1 tablet by mouth daily.    . meloxicam (MOBIC) 15 MG tablet Take 1 tablet (15 mg total) by mouth daily. 30 tablet 0  . methocarbamol (ROBAXIN) 500 MG tablet Take 1 tablet (500 mg total) by mouth 4 (four) times daily. 16 tablet 0   No facility-administered medications prior to visit.      Per HPI unless specifically indicated in ROS section below Review of Systems Objective:    BP (!) 160/90 (BP Location: Right Arm, Patient Position: Sitting, Cuff Size: Normal)   Pulse 90   Temp 98.6 F (37 C) (Oral)   Ht 5\' 7"  (1.702 m)   Wt 168 lb 12 oz (76.5 kg)   SpO2 95%   BMI 26.43 kg/m   Wt Readings from Last 3 Encounters:  07/30/18 168 lb 12 oz (76.5 kg)  07/25/18 171 lb (77.6 kg)  06/21/18 165 lb (74.8 kg)    Physical Exam Vitals signs and nursing note reviewed.  Constitutional:      General: He is not in acute distress.    Appearance: Normal appearance. He is well-developed.  HENT:     Head: Normocephalic and atraumatic.     Right Ear: Hearing, tympanic membrane, ear canal and external ear normal.  Left Ear: Hearing, tympanic membrane, ear canal and external ear normal.     Nose: Mucosal edema, congestion and rhinorrhea present.     Right Sinus: No maxillary sinus tenderness or frontal sinus tenderness.     Left Sinus: No maxillary sinus tenderness or frontal sinus tenderness.     Comments: Purulent nasal discharge R>L     Mouth/Throat:     Pharynx: Uvula midline. No oropharyngeal exudate or posterior oropharyngeal erythema.     Tonsils: No tonsillar abscesses.  Eyes:     General: No scleral icterus.    Conjunctiva/sclera: Conjunctivae normal.     Pupils: Pupils are equal, round, and reactive to light.  Neck:     Musculoskeletal: Normal range of motion and neck supple.  Cardiovascular:     Rate and Rhythm: Normal rate and regular rhythm.       Pulses: Normal pulses.     Heart sounds: Normal heart sounds. No murmur.  Pulmonary:     Effort: Pulmonary effort is normal. No respiratory distress.     Breath sounds: Normal breath sounds. No wheezing, rhonchi or rales.  Lymphadenopathy:     Cervical: No cervical adenopathy.  Skin:    General: Skin is warm and dry.     Findings: No rash.  Neurological:     Mental Status: He is alert.       Assessment & Plan:   Problem List Items Addressed This Visit    Hypertension    BP markedly elevated today - in setting of recent decongestant use. Discussed need to avoid decongestants. He will monitor BP at home ensuring levels improve, will let me know if persistently >140/90.       Acute sinusitis - Primary    Story/exam consistent with sinusitis, however endorses symptoms may be improving over last 24-48 hours. WASP for augmentin provided today with indications when to fill. Further supportive care as per instructions.       Relevant Medications   amoxicillin-clavulanate (AUGMENTIN) 875-125 MG tablet       Meds ordered this encounter  Medications  . amoxicillin-clavulanate (AUGMENTIN) 875-125 MG tablet    Sig: Take 1 tablet by mouth 2 (two) times daily for 10 days.    Dispense:  20 tablet    Refill:  0   No orders of the defined types were placed in this encounter.  Patient Instructions  You have a sinus infection. May still be viral.  Push fluids and rest.  Nasal saline irrigation or neti pot to help drain sinuses. May use plain mucinex (guaifenesin) with plenty of fluid to help mobilize mucous. May use flonase nasal steroid.  Continue OTC cough syrup.  If fever, sinus pressure/facial pain or congestion peristing past next 2-3 days, fill antibiotic printed out today.  Keep an eye on blood pressures at home. Let me know if consistently >140/90. Avoid decongestants (because they raise blood pressure)   Follow up plan: Return if symptoms worsen or fail to  improve.  Ria Bush, MD

## 2018-08-05 ENCOUNTER — Encounter: Payer: Self-pay | Admitting: Family Medicine

## 2018-08-07 MED ORDER — AMLODIPINE BESYLATE 10 MG PO TABS: 10.0000 mg | ORAL_TABLET | Freq: Every day | ORAL | 1 refills | Status: DC

## 2018-08-07 NOTE — Telephone Encounter (Signed)
Pt contacted office inquiring about previous message sent. Pt wanted to ensure message has been received and is requesting a response on how to proceed. pls advise

## 2018-08-14 DIAGNOSIS — D481 Neoplasm of uncertain behavior of connective and other soft tissue: Secondary | ICD-10-CM | POA: Diagnosis not present

## 2018-08-26 ENCOUNTER — Ambulatory Visit (INDEPENDENT_AMBULATORY_CARE_PROVIDER_SITE_OTHER): Payer: PPO | Admitting: Family Medicine

## 2018-08-26 ENCOUNTER — Encounter: Payer: Self-pay | Admitting: Family Medicine

## 2018-08-26 VITALS — BP 160/90 | HR 85 | Temp 97.9°F | Resp 12 | Ht 67.0 in | Wt 168.0 lb

## 2018-08-26 DIAGNOSIS — I10 Essential (primary) hypertension: Secondary | ICD-10-CM

## 2018-08-26 DIAGNOSIS — Z23 Encounter for immunization: Secondary | ICD-10-CM | POA: Diagnosis not present

## 2018-08-26 DIAGNOSIS — I340 Nonrheumatic mitral (valve) insufficiency: Secondary | ICD-10-CM

## 2018-08-26 MED ORDER — HYDROCHLOROTHIAZIDE 12.5 MG PO CAPS: 12.5000 mg | ORAL_CAPSULE | Freq: Every day | ORAL | 6 refills | Status: DC

## 2018-08-26 NOTE — Assessment & Plan Note (Signed)
Marked murmur found to be due to MVR.

## 2018-08-26 NOTE — Patient Instructions (Addendum)
Blood pressure is staying elevated - add on hydrochlorothiazide 12.5mg  daily to current amlodipine 10mg .  Take hctz in am, take amlodipine at night time.  Return in 4-6 weeks for follow up visit.  Flu shot today

## 2018-08-26 NOTE — Assessment & Plan Note (Addendum)
Chronic, remains uncontrolled in office today and at home readings despite increased amlodipine to 10mg . Will add hctz 12.5mg  daily to regimen. Take amlodipine at night, hctz in am. RTC 4-6 wks f/u visit. Pt agrees with plan. Discussed importance of good hydration status. Anticipate stressors contribute to elevated readings.

## 2018-08-26 NOTE — Progress Notes (Signed)
BP (!) 160/90 (BP Location: Right Arm, Cuff Size: Normal)   Pulse 85   Temp 97.9 F (36.6 C)   Resp 12   Ht 5\' 7"  (1.702 m)   Wt 168 lb (76.2 kg)   BMI 26.31 kg/m    CC: HTN f/u visit Subjective:    Patient ID: Aaron Evangelist., male    DOB: [birth year], 73 y.o.   MRN: [id number]  [name] is a 73 y.o. male presenting on 08/26/2018 for Hypertension (follow up)   HTN - see prior notes for details. Compliant with current antihypertensive regimen of amlodipine 10mg  daily (recently increased and tolerating well). Does check blood pressures at home: 140-160/80-90s. He has bought new BP monitor. No low blood pressure readings or symptoms of dizziness/syncope. Denies HA, vision changes, CP/tightness, SOB, leg swelling. Continues walking a couple miles a day.   Last 18 months have been very stressful for him - cares for 2 elderly parents in [place]. Father passed away last month.     Relevant past medical, surgical, family and social history reviewed and updated as indicated. Interim medical history since our last visit reviewed. Allergies and medications reviewed and updated. Outpatient Medications Prior to Visit  Medication Sig Dispense Refill  . amLODipine (NORVASC) 10 MG tablet Take 1 tablet (10 mg total) by mouth daily. 90 tablet 1  . beta carotene w/minerals (OCUVITE) tablet Take 1 tablet by mouth daily.    . cholecalciferol (VITAMIN D) 1000 UNITS tablet Take 1,000 Units by mouth daily.    . Ciclopirox 1 % shampoo     . clindamycin (CLEOCIN T) 1 % external solution     . cyclobenzaprine (FLEXERIL) 10 MG tablet Take 1 tablet (10 mg total) by mouth 2 (two) times daily as needed for muscle spasms. 40 tablet 1  . etodolac (LODINE) 400 MG tablet Take 1 tablet (400 mg total) by mouth 2 (two) times daily as needed. 40 tablet 1  . Multiple Vitamin (MULTIVITAMIN) tablet Take 1 tablet by mouth daily.     No facility-administered medications prior to visit.        Per HPI unless specifically indicated in ROS section below Review of Systems Objective:    BP (!) 160/90 (BP Location: Right Arm, Cuff Size: Normal)   Pulse 85   Temp 97.9 F (36.6 C)   Resp 12   Ht 5\' 7"  (1.702 m)   Wt 168 lb (76.2 kg)   BMI 26.31 kg/m   Wt Readings from Last 3 Encounters:  08/26/18 168 lb (76.2 kg)  07/30/18 168 lb 12 oz (76.5 kg)  07/25/18 171 lb (77.6 kg)    Physical Exam Vitals signs and nursing note reviewed.  Constitutional:      Appearance: Normal appearance. He is not ill-appearing.  Cardiovascular:     Rate and Rhythm: Normal rate and regular rhythm.     Pulses: Normal pulses.     Heart sounds: Murmur (3/6 systolic at apex) present.  Pulmonary:     Effort: Pulmonary effort is normal. No respiratory distress.     Breath sounds: Normal breath sounds. No wheezing, rhonchi or rales.  Musculoskeletal:     Right lower leg: No edema.     Left lower leg: No edema.  Neurological:     Mental Status: He is alert.       Lab Results  Component Value Date   CREATININE 1.11 01/07/2018   BUN 13 01/07/2018   NA 137  01/07/2018   K 4.7 01/07/2018   CL 101 01/07/2018   CO2 29 01/07/2018    Assessment & Plan:   Problem List Items Addressed This Visit    Mitral valve regurgitation    Marked murmur found to be due to MVR.       Relevant Medications   hydrochlorothiazide (MICROZIDE) 12.5 MG capsule   Hypertension - Primary    Chronic, remains uncontrolled in office today and at home readings despite increased amlodipine to 10mg . Will add hctz 12.5mg  daily to regimen. Take amlodipine at night, hctz in am. RTC 4-6 wks f/u visit. Pt agrees with plan. Discussed importance of good hydration status. Anticipate stressors contribute to elevated readings.       Relevant Medications   hydrochlorothiazide (MICROZIDE) 12.5 MG capsule       Meds ordered this encounter  Medications  . hydrochlorothiazide (MICROZIDE) 12.5 MG capsule    Sig: Take 1 capsule  (12.5 mg total) by mouth daily.    Dispense:  30 capsule    Refill:  6   Orders Placed This Encounter  Procedures  . Flu Vaccine QUAD 6+ mos PF IM (Fluarix Quad PF)    Follow up plan: Return in about 6 weeks (around 10/07/2018) for follow up visit.  Ria Bush, MD

## 2018-09-09 DIAGNOSIS — C499 Malignant neoplasm of connective and soft tissue, unspecified: Secondary | ICD-10-CM | POA: Diagnosis not present

## 2018-09-09 DIAGNOSIS — C490 Malignant neoplasm of connective and soft tissue of head, face and neck: Secondary | ICD-10-CM | POA: Diagnosis not present

## 2018-09-09 DIAGNOSIS — D481 Neoplasm of uncertain behavior of connective and other soft tissue: Secondary | ICD-10-CM | POA: Diagnosis not present

## 2018-10-03 ENCOUNTER — Telehealth: Payer: Self-pay | Admitting: Family Medicine

## 2018-10-03 NOTE — Telephone Encounter (Signed)
Left message asking pt to call office please offer webex appointment  Ask if pt has bp cuff to take bp

## 2018-10-03 NOTE — Telephone Encounter (Signed)
Pt stated he does have a BP Cuff to take his BP

## 2018-10-04 ENCOUNTER — Ambulatory Visit: Payer: Self-pay | Admitting: *Deleted

## 2018-10-04 NOTE — Telephone Encounter (Signed)
Noted  

## 2018-10-04 NOTE — Telephone Encounter (Signed)
Patient has been taking hydrochlorothiazide  for 3 weeks- patient had swelling in legs and thought that the medication was not working. Patient stopped taking the medication 3 days ago and the swelling is worse.Patient states he has increase bilateral swelling and he has increased pain- especially in his left leg.  Patient states he restarted his medication today.  Reason for Disposition . [1] MODERATE leg swelling (e.g., swelling extends up to knees) AND [2] new onset or worsening    Patient reports he was given medication for swelling in leg- felt it was not helping that much- he quit medication 3 days ago and swelling is worse. He did restart medication this afternoon. Patient may benefit from virtual visit today  Answer Assessment - Initial Assessment Questions 1. ONSET: "When did the swelling start?" (e.g., minutes, hours, days)     3 days ago- patient stopped medication- today it is worse 2. LOCATION: "What part of the leg is swollen?"  "Are both legs swollen or just one leg?"     left foot is always worse, left foot swollen, right is not quite as bad- ankle is swollen-( he can see the ankle bone)- skin on calf- tight and shinny- bilateral.  3. SEVERITY: "How bad is the swelling?" (e.g., localized; mild, moderate, severe)  - Localized - small area of swelling localized to one leg  - MILD pedal edema - swelling limited to foot and ankle, pitting edema < 1/4 inch (6 mm) deep, rest and elevation eliminate most or all swelling  - MODERATE edema - swelling of lower leg to knee, pitting edema > 1/4 inch (6 mm) deep, rest and elevation only partially reduce swelling  - SEVERE edema - swelling extends above knee, facial or hand swelling present      Patient has not elevated to see if swelling goes down- moderate 4. REDNESS: "Does the swelling look red or infected?"     no 5. PAIN: "Is the swelling painful to touch?" If so, ask: "How painful is it?"   (Scale 1-10; mild, moderate or severe)     On  the inside of the leg- midway in the middle of the leg- it is sore 6. FEVER: "Do you have a fever?" If so, ask: "What is it, how was it measured, and when did it start?"      no 7. CAUSE: "What do you think is causing the leg swelling?"     Stopped hydrochlorothiazide 3 days ago- did restart it today 8. MEDICAL HISTORY: "Do you have a history of heart failure, kidney disease, liver failure, or cancer?"     no 9. RECURRENT SYMPTOM: "Have you had leg swelling before?" If so, ask: "When was the last time?" "What happened that time?"     Poor circulation- never had swelling like this before 10. OTHER SYMPTOMS: "Do you have any other symptoms?" (e.g., chest pain, difficulty breathing)       no 11. PREGNANCY: "Is there any chance you are pregnant?" "When was your last menstrual period?"       n/a  Protocols used: LEG SWELLING AND EDEMA-A-AH

## 2018-10-07 ENCOUNTER — Other Ambulatory Visit: Payer: Self-pay

## 2018-10-07 ENCOUNTER — Encounter: Payer: Self-pay | Admitting: Family Medicine

## 2018-10-07 ENCOUNTER — Ambulatory Visit: Payer: PPO | Admitting: Family Medicine

## 2018-10-07 ENCOUNTER — Ambulatory Visit (INDEPENDENT_AMBULATORY_CARE_PROVIDER_SITE_OTHER): Payer: PPO | Admitting: Family Medicine

## 2018-10-07 VITALS — BP 141/86 | Temp 97.8°F | Ht 67.0 in | Wt 169.0 lb

## 2018-10-07 DIAGNOSIS — I10 Essential (primary) hypertension: Secondary | ICD-10-CM | POA: Diagnosis not present

## 2018-10-07 DIAGNOSIS — R600 Localized edema: Secondary | ICD-10-CM | POA: Diagnosis not present

## 2018-10-07 NOTE — Assessment & Plan Note (Addendum)
Acute worsening over last 5-6 days without significant change in sodium intake. Possibly related to amlodipine side effect (dose increased to 10mg  08/2018). Will decrease amlodipine back to 5mg  daily - will likely need hctz increased (if tolerating) but want to do 1 change at a time. Advised to contact us in 1 wk with BP update, but pt knows to notify us sooner if any worsening leg symptoms for in office evaluation. Pt agrees with plan.  I did ask him to send me pic of leg swelling to review through mychart.

## 2018-10-07 NOTE — Assessment & Plan Note (Signed)
Improved control, however possible side effects to increase in amlodipine. See below.

## 2018-10-07 NOTE — Progress Notes (Signed)
Virtual visit completed through WebEx.  Patient location: home Provider location: [place] at [facility], office If any vitals were documented below, they were collected by patient at home unless specified below.    BP (!) 141/86   Temp 97.8 F (36.6 C) (Oral)   Ht 5\' 7"  (1.702 m)   Wt 169 lb (76.7 kg)   BMI 26.47 kg/m    BP Readings from Last 3 Encounters:  10/07/18 (!) 141/86  08/26/18 (!) 160/90  07/30/18 (!) 160/90  147/93 this morning 148/87 yesterday  CC: leg swelling Subjective:    Patient ID: Aaron Brown., male    DOB: [birth year], 73 y.o.   MRN: [id number]  HPI: Aroldo Galli. is a 73 y.o. male presenting on 10/07/2018 for Leg Swelling    See prior note for details. HTN regimen is amlodipine 10mg  daily, HCTZ 12.5mg  daily (started last visit due to uncontrolled HTN). Notes ongoing leg swelling - which may have worsened since starting HCTZ. L>R leg swelling for 5 days. Even having trouble walking. He stopped hctz without improvement. Drinking good fluid - increased water intake to 60 oz. Leg swelling starts at feet and travels up to mid calf.   No redness or warmth of legs. No fevers/chills.  No recent prolonged period of immobility.  No personal or family hx blood clots.  He does follow good low sodium diet and regular aerobic exercise.   Lisinopril may have caused lip swelling.   Father passed away 07-29-2018.      Relevant past medical, surgical, family and social history reviewed and updated as indicated. Interim medical history since our last visit reviewed. Allergies and medications reviewed and updated. Outpatient Medications Prior to Visit  Medication Sig Dispense Refill  . amLODipine (NORVASC) 10 MG tablet Take 1 tablet (10 mg total) by mouth daily. 90 tablet 1  . beta carotene w/minerals (OCUVITE) tablet Take 1 tablet by mouth daily.    . cholecalciferol (VITAMIN D) 1000 UNITS tablet Take 1,000 Units by mouth daily.    . Ciclopirox 1 %  shampoo     . clindamycin (CLEOCIN T) 1 % external solution     . cyclobenzaprine (FLEXERIL) 10 MG tablet Take 1 tablet (10 mg total) by mouth 2 (two) times daily as needed for muscle spasms. 40 tablet 1  . etodolac (LODINE) 400 MG tablet Take 1 tablet (400 mg total) by mouth 2 (two) times daily as needed. 40 tablet 1  . hydrochlorothiazide (MICROZIDE) 12.5 MG capsule Take 1 capsule (12.5 mg total) by mouth daily. 30 capsule 6  . Multiple Vitamin (MULTIVITAMIN) tablet Take 1 tablet by mouth daily.     No facility-administered medications prior to visit.      Per HPI unless specifically indicated in ROS section below Review of Systems Objective:    BP (!) 141/86   Temp 97.8 F (36.6 C) (Oral)   Ht 5\' 7"  (1.702 m)   Wt 169 lb (76.7 kg)   BMI 26.47 kg/m   Wt Readings from Last 3 Encounters:  10/07/18 169 lb (76.7 kg)  08/26/18 168 lb (76.2 kg)  07/30/18 168 lb 12 oz (76.5 kg)     Physical exam: Gen: alert, NAD, not ill appearing Pulm: speaks in complete sentences without increased work of breathing Psych: normal mood, normal thought content      Lab Results  Component Value Date   CREATININE 1.11 01/07/2018   BUN 13 01/07/2018   NA 137 01/07/2018  K 4.7 01/07/2018   CL 101 01/07/2018   CO2 29 01/07/2018    Assessment & Plan:   Problem List Items Addressed This Visit    Pedal edema - Primary    Acute worsening over last 5-6 days without significant change in sodium intake. Possibly related to amlodipine side effect (dose increased to 10mg  08/2018). Will decrease amlodipine back to 5mg  daily - will likely need hctz increased (if tolerating) but want to do 1 change at a time. Advised to contact us in 1 wk with BP update, but pt knows to notify us sooner if any worsening leg symptoms for in office evaluation. Pt agrees with plan.  I did ask him to send me pic of leg swelling to review through mychart.       Hypertension    Improved control, however possible side effects to  increase in amlodipine. See below.           No orders of the defined types were placed in this encounter.  No orders of the defined types were placed in this encounter.   Follow up plan: No follow-ups on file.  Ria Bush, MD

## 2018-10-22 DIAGNOSIS — L570 Actinic keratosis: Secondary | ICD-10-CM | POA: Diagnosis not present

## 2018-10-22 DIAGNOSIS — Z85828 Personal history of other malignant neoplasm of skin: Secondary | ICD-10-CM | POA: Diagnosis not present

## 2018-10-22 DIAGNOSIS — Z1283 Encounter for screening for malignant neoplasm of skin: Secondary | ICD-10-CM | POA: Diagnosis not present

## 2018-10-22 DIAGNOSIS — D485 Neoplasm of uncertain behavior of skin: Secondary | ICD-10-CM | POA: Diagnosis not present

## 2018-10-22 DIAGNOSIS — C44621 Squamous cell carcinoma of skin of unspecified upper limb, including shoulder: Secondary | ICD-10-CM

## 2018-10-22 DIAGNOSIS — C44319 Basal cell carcinoma of skin of other parts of face: Secondary | ICD-10-CM | POA: Diagnosis not present

## 2018-10-22 DIAGNOSIS — C44622 Squamous cell carcinoma of skin of right upper limb, including shoulder: Secondary | ICD-10-CM | POA: Diagnosis not present

## 2018-10-22 HISTORY — DX: Squamous cell carcinoma of skin of unspecified upper limb, including shoulder: C44.621

## 2018-10-25 ENCOUNTER — Encounter: Payer: Self-pay | Admitting: Family Medicine

## 2018-10-25 MED ORDER — HYDROCHLOROTHIAZIDE 25 MG PO TABS: 25.0000 mg | ORAL_TABLET | Freq: Every day | ORAL | 6 refills | Status: DC

## 2018-10-25 MED ORDER — AMLODIPINE BESYLATE 5 MG PO TABS: 5.0000 mg | ORAL_TABLET | Freq: Every day | ORAL | 1 refills | Status: DC

## 2018-10-25 NOTE — Telephone Encounter (Signed)
See other note

## 2018-10-28 ENCOUNTER — Ambulatory Visit (INDEPENDENT_AMBULATORY_CARE_PROVIDER_SITE_OTHER): Payer: PPO | Admitting: Family Medicine

## 2018-10-28 ENCOUNTER — Other Ambulatory Visit: Payer: Self-pay

## 2018-10-28 ENCOUNTER — Encounter: Payer: Self-pay | Admitting: Family Medicine

## 2018-10-28 VITALS — BP 160/97 | HR 88 | Temp 97.6°F | Ht 67.0 in | Wt 166.3 lb

## 2018-10-28 DIAGNOSIS — I10 Essential (primary) hypertension: Secondary | ICD-10-CM | POA: Diagnosis not present

## 2018-10-28 DIAGNOSIS — R600 Localized edema: Secondary | ICD-10-CM

## 2018-10-28 NOTE — Assessment & Plan Note (Signed)
Chronic, uncontrolled, forgot amlodipine dose last night. Will monitor BP for next 1-2 wks and update [place] with readings. I asked him to return next Monday for labs as well. Reviewed low sodium, high potassium diet.

## 2018-10-28 NOTE — Assessment & Plan Note (Signed)
Persisting but improved compared to initially. Anticipate amlodipine side effect. Will check other labwork next week to further evaluate.

## 2018-10-28 NOTE — Progress Notes (Signed)
Virtual visit completed through Doxy.Me. Due to national recommendations of social distancing due to [place] 19, a virtual visit is felt to be most appropriate for this patient at this time.   Patient location: home Provider location: [place] at [facility], office If any vitals were documented, they were collected by patient at home unless specified below.    BP (!) 160/97 (BP Location: Right Arm, Patient Position: Sitting, Cuff Size: Normal)   Pulse 88   Temp 97.6 F (36.4 C) (Oral)   Ht 5\' 7"  (1.702 m)   Wt 166 lb 5 oz (75.4 kg)   BMI 26.05 kg/m    CC: HTN f/u Subjective:    Patient ID: Aaron Evangelist., male    DOB: 1946-06-25, 73 y.o.   MRN: [id number]  HPI: Aaron Beitler. is a 73 y.o. male presenting on 10/28/2018 for Hypertension (Wants to discuss BP that is not improving. )   See prior notes for details. Previously on HCTZ 12.5mg  and amlodipine 10mg  daily however concern with marked L>R leg swelling earlier this month so we changed to amlodipine 5mg  and hctz 25mg  daily. This has not helped control his blood pressure effectively. Takes hctz 25mg  in am (started yesterday) and amlodipine at night. Continues noticing L leg indentation wit hsock but overall better than earlier this month. No HA, vision changes, CP/tightness, SOB.   Discussed goal BP <140/90.  BP elevated today - he forgot to take his amlodipine last night.   He does follow good low sodium diet and regular aerobic exercise.  He has been incorporating more fruits/vegetables in diet.   Lisinopril may have caused lip swelling.  Father passed away earlier in the year.   On lodine 400mg  BID PRN - rarely uses - non in the last few months.   Denies covid19 symptoms.     Relevant past medical, surgical, family and social history reviewed and updated as indicated. Interim medical history since our last visit reviewed. Allergies and medications reviewed and updated. Outpatient Medications Prior to Visit   Medication Sig Dispense Refill  . amLODipine (NORVASC) 5 MG tablet Take 1 tablet (5 mg total) by mouth daily. 90 tablet 1  . beta carotene w/minerals (OCUVITE) tablet Take 1 tablet by mouth daily.    . cholecalciferol (VITAMIN D) 1000 UNITS tablet Take 1,000 Units by mouth daily.    . Ciclopirox 1 % shampoo     . clindamycin (CLEOCIN T) 1 % external solution     . cyclobenzaprine (FLEXERIL) 10 MG tablet Take 1 tablet (10 mg total) by mouth 2 (two) times daily as needed for muscle spasms. 40 tablet 1  . etodolac (LODINE) 400 MG tablet Take 1 tablet (400 mg total) by mouth 2 (two) times daily as needed. 40 tablet 1  . hydrochlorothiazide (HYDRODIURIL) 25 MG tablet Take 1 tablet (25 mg total) by mouth daily. 30 tablet 6  . Multiple Vitamin (MULTIVITAMIN) tablet Take 1 tablet by mouth daily.     No facility-administered medications prior to visit.      Per HPI unless specifically indicated in ROS section below Review of Systems Objective:    BP (!) 160/97 (BP Location: Right Arm, Patient Position: Sitting, Cuff Size: Normal)   Pulse 88   Temp 97.6 F (36.4 C) (Oral)   Ht 5\' 7"  (1.702 m)   Wt 166 lb 5 oz (75.4 kg)   BMI 26.05 kg/m   Wt Readings from Last 3 Encounters:  10/28/18 166 lb 5  oz (75.4 kg)  10/07/18 169 lb (76.7 kg)  08/26/18 168 lb (76.2 kg)     Physical exam: Gen: alert, NAD, not ill appearing Pulm: speaks in complete sentences without increased work of breathing Psych: normal mood, normal thought content      Lab Results  Component Value Date   CREATININE 1.11 01/07/2018   BUN 13 01/07/2018   NA 137 01/07/2018   K 4.7 01/07/2018   CL 101 01/07/2018   CO2 29 01/07/2018    Assessment & Plan:  Lab visit next Monday 9am - will park in back and call us when he arrives.  Problem List Items Addressed This Visit    Pedal edema    Persisting but improved compared to initially. Anticipate amlodipine side effect. Will check other labwork next week to further  evaluate.       Relevant Orders   Comprehensive metabolic panel   TSH   Microalbumin / creatinine urine ratio   CBC with Differential/Platelet   Hypertension - Primary    Chronic, uncontrolled, forgot amlodipine dose last night. Will monitor BP for next 1-2 wks and update [place] with readings. I asked him to return next Monday for labs as well. Reviewed low sodium, high potassium diet.       Relevant Orders   Comprehensive metabolic panel   TSH   Microalbumin / creatinine urine ratio       No orders of the defined types were placed in this encounter.  Orders Placed This Encounter  Procedures  . Comprehensive metabolic panel    Standing Status:   Future    Standing Expiration Date:   10/28/2019  . TSH    Standing Status:   Future    Standing Expiration Date:   10/28/2019  . Microalbumin / creatinine urine ratio    Standing Status:   Future    Standing Expiration Date:   10/28/2019  . CBC with Differential/Platelet    Standing Status:   Future    Standing Expiration Date:   10/28/2019    Follow up plan: No follow-ups on file.  Ria Bush, MD

## 2018-11-04 ENCOUNTER — Other Ambulatory Visit: Payer: Self-pay

## 2018-11-04 ENCOUNTER — Other Ambulatory Visit (INDEPENDENT_AMBULATORY_CARE_PROVIDER_SITE_OTHER): Payer: PPO

## 2018-11-04 DIAGNOSIS — I10 Essential (primary) hypertension: Secondary | ICD-10-CM

## 2018-11-04 DIAGNOSIS — R600 Localized edema: Secondary | ICD-10-CM

## 2018-11-04 LAB — COMPREHENSIVE METABOLIC PANEL
ALT: 28 U/L (ref 0–53)
AST: 36 U/L (ref 0–37)
Albumin: 4.2 g/dL (ref 3.5–5.2)
Alkaline Phosphatase: 55 U/L (ref 39–117)
BUN: 14 mg/dL (ref 6–23)
CO2: 30 mEq/L (ref 19–32)
Calcium: 9.1 mg/dL (ref 8.4–10.5)
Chloride: 97 mEq/L (ref 96–112)
Creatinine, Ser: 1.02 mg/dL (ref 0.40–1.50)
GFR: 71.94 mL/min (ref >60.00)
Glucose, Bld: 99 mg/dL (ref 70–99)
Potassium: 3.8 mEq/L (ref 3.5–5.1)
Sodium: 135 mEq/L (ref 135–145)
Total Bilirubin: 0.6 mg/dL (ref 0.2–1.2)
Total Protein: 7.4 g/dL (ref 6.0–8.3)

## 2018-11-04 LAB — CBC WITH DIFFERENTIAL/PLATELET
Basophils Absolute: 0 10*3/uL (ref 0.0–0.1)
Basophils Relative: 0.9 % (ref 0.0–3.0)
Eosinophils Absolute: 0 10*3/uL (ref 0.0–0.7)
Eosinophils Relative: 0.6 % (ref 0.0–5.0)
HCT: 40 % (ref 39.0–52.0)
Hemoglobin: 14.3 g/dL (ref 13.0–17.0)
Lymphocytes Relative: 36.9 % (ref 12.0–46.0)
Lymphs Abs: 2 10*3/uL (ref 0.7–4.0)
MCHC: 35.7 g/dL (ref 30.0–36.0)
MCV: 91.8 fl (ref 78.0–100.0)
Monocytes Absolute: 0.7 10*3/uL (ref 0.1–1.0)
Monocytes Relative: 14 % — ABNORMAL HIGH (ref 3.0–12.0)
Neutro Abs: 2.5 10*3/uL (ref 1.4–7.7)
Neutrophils Relative %: 47.6 % (ref 43.0–77.0)
Platelets: 210 10*3/uL (ref 150.0–400.0)
RBC: 4.36 Mil/uL (ref 4.22–5.81)
RDW: 12.8 % (ref 11.5–15.5)
WBC: 5.3 10*3/uL (ref 4.0–10.5)

## 2018-11-04 LAB — MICROALBUMIN / CREATININE URINE RATIO
Creatinine,U: 146.8 mg/dL
Microalb Creat Ratio: 0.5 mg/g (ref 0.0–30.0)
Microalb, Ur: <0.7 mg/dL (ref 0.0–1.9)

## 2018-11-04 LAB — TSH: TSH: 3.36 u[IU]/mL (ref 0.35–4.50)

## 2018-11-05 ENCOUNTER — Encounter: Payer: Self-pay | Admitting: Family Medicine

## 2018-11-08 MED ORDER — LOSARTAN POTASSIUM 25 MG PO TABS: 25.0000 mg | ORAL_TABLET | Freq: Every day | ORAL | 3 refills | Status: DC

## 2018-11-08 NOTE — Telephone Encounter (Signed)
Starting losartan 25mg  in addition to amlodipine 5mg  and hctz 25mg  daily.

## 2018-11-11 DIAGNOSIS — L988 Other specified disorders of the skin and subcutaneous tissue: Secondary | ICD-10-CM | POA: Diagnosis not present

## 2018-11-11 DIAGNOSIS — C44319 Basal cell carcinoma of skin of other parts of face: Secondary | ICD-10-CM | POA: Diagnosis not present

## 2018-11-18 DIAGNOSIS — C44319 Basal cell carcinoma of skin of other parts of face: Secondary | ICD-10-CM | POA: Diagnosis not present

## 2018-12-03 DIAGNOSIS — H40153 Residual stage of open-angle glaucoma, bilateral: Secondary | ICD-10-CM | POA: Diagnosis not present

## 2019-01-24 ENCOUNTER — Ambulatory Visit: Payer: PPO

## 2019-01-24 ENCOUNTER — Other Ambulatory Visit: Payer: Self-pay | Admitting: Family Medicine

## 2019-01-24 ENCOUNTER — Other Ambulatory Visit (INDEPENDENT_AMBULATORY_CARE_PROVIDER_SITE_OTHER): Payer: PPO

## 2019-01-24 ENCOUNTER — Other Ambulatory Visit: Payer: Self-pay

## 2019-01-24 DIAGNOSIS — Z125 Encounter for screening for malignant neoplasm of prostate: Secondary | ICD-10-CM

## 2019-01-24 DIAGNOSIS — I10 Essential (primary) hypertension: Secondary | ICD-10-CM

## 2019-01-24 DIAGNOSIS — R7303 Prediabetes: Secondary | ICD-10-CM

## 2019-01-24 LAB — LIPID PANEL
Cholesterol: 144 mg/dL (ref 0–200)
HDL: 46.6 mg/dL (ref >39.00)
LDL Cholesterol: 78 mg/dL (ref 0–99)
NonHDL: 97.38
Total CHOL/HDL Ratio: 3
Triglycerides: 97 mg/dL (ref 0.0–149.0)
VLDL: 19.4 mg/dL (ref 0.0–40.0)

## 2019-01-24 LAB — BASIC METABOLIC PANEL
BUN: 15 mg/dL (ref 6–23)
CO2: 30 mEq/L (ref 19–32)
Calcium: 9.3 mg/dL (ref 8.4–10.5)
Chloride: 100 mEq/L (ref 96–112)
Creatinine, Ser: 1.05 mg/dL (ref 0.40–1.50)
GFR: 69.53 mL/min (ref >60.00)
Glucose, Bld: 94 mg/dL (ref 70–99)
Potassium: 4.4 mEq/L (ref 3.5–5.1)
Sodium: 136 mEq/L (ref 135–145)

## 2019-01-24 LAB — HEMOGLOBIN A1C: Hgb A1c MFr Bld: 6 % (ref 4.6–6.5)

## 2019-01-24 LAB — PSA: PSA: 2.69 ng/mL (ref 0.10–4.00)

## 2019-01-27 ENCOUNTER — Other Ambulatory Visit: Payer: Self-pay

## 2019-01-27 ENCOUNTER — Ambulatory Visit (INDEPENDENT_AMBULATORY_CARE_PROVIDER_SITE_OTHER): Payer: PPO | Admitting: Family Medicine

## 2019-01-27 ENCOUNTER — Encounter: Payer: Self-pay | Admitting: Family Medicine

## 2019-01-27 VITALS — BP 120/80 | HR 92 | Temp 98.0°F | Ht 67.0 in | Wt 168.1 lb

## 2019-01-27 DIAGNOSIS — H40059 Ocular hypertension, unspecified eye: Secondary | ICD-10-CM

## 2019-01-27 DIAGNOSIS — I340 Nonrheumatic mitral (valve) insufficiency: Secondary | ICD-10-CM

## 2019-01-27 DIAGNOSIS — R7303 Prediabetes: Secondary | ICD-10-CM

## 2019-01-27 DIAGNOSIS — Z7189 Other specified counseling: Secondary | ICD-10-CM

## 2019-01-27 DIAGNOSIS — I6789 Other cerebrovascular disease: Secondary | ICD-10-CM

## 2019-01-27 DIAGNOSIS — Z0000 Encounter for general adult medical examination without abnormal findings: Secondary | ICD-10-CM | POA: Diagnosis not present

## 2019-01-27 DIAGNOSIS — I10 Essential (primary) hypertension: Secondary | ICD-10-CM

## 2019-01-27 MED ORDER — LOSARTAN POTASSIUM 25 MG PO TABS: 25.0000 mg | ORAL_TABLET | Freq: Every day | ORAL | 1 refills | Status: DC

## 2019-01-27 MED ORDER — CYCLOBENZAPRINE HCL 10 MG PO TABS: 10.0000 mg | ORAL_TABLET | Freq: Two times a day (BID) | ORAL | 1 refills | Status: DC | PRN

## 2019-01-27 MED ORDER — ETODOLAC 400 MG PO TABS: 400.0000 mg | ORAL_TABLET | Freq: Two times a day (BID) | ORAL | 1 refills | Status: DC | PRN

## 2019-01-27 MED ORDER — HYDROCHLOROTHIAZIDE 25 MG PO TABS: 25.0000 mg | ORAL_TABLET | Freq: Every day | ORAL | 1 refills | Status: DC

## 2019-01-27 NOTE — Assessment & Plan Note (Addendum)
Consider restarting aspirin 81 mg daily

## 2019-01-27 NOTE — Patient Instructions (Addendum)
In 3 months, stop amlodipine and increase losartan to 50mg  daily - let me know when you do this to send in the higher losartan dose.  Recommend repeat ultrasound at end of the year to monitor mitral valve regurgitation (leaky valve). Sounding stable today.  Good to see you today, call us with questions. Return as needed or in 6 months for blood pressure follow up visit.   Health Maintenance After Age 71 After age 85, you are at a higher risk for certain long-term diseases and infections as well as injuries from falls. Falls are a major cause of broken bones and head injuries in people who are older than age 75. Getting regular preventive care can help to keep you healthy and well. Preventive care includes getting regular testing and making lifestyle changes as recommended by your health care provider. Talk with your health care provider about:  Which screenings and tests you should have. A screening is a test that checks for a disease when you have no symptoms.  A diet and exercise plan that is right for you. What should I know about screenings and tests to prevent falls? Screening and testing are the best ways to find a health problem early. Early diagnosis and treatment give you the best chance of managing medical conditions that are common after age 35. Certain conditions and lifestyle choices may make you more likely to have a fall. Your health care provider may recommend:  Regular vision checks. Poor vision and conditions such as cataracts can make you more likely to have a fall. If you wear glasses, make sure to get your prescription updated if your vision changes.  Medicine review. Work with your health care provider to regularly review all of the medicines you are taking, including over-the-counter medicines. Ask your health care provider about any side effects that may make you more likely to have a fall. Tell your health care provider if any medicines that you take make you feel dizzy or  sleepy.  Osteoporosis screening. Osteoporosis is a condition that causes the bones to get weaker. This can make the bones weak and cause them to break more easily.  Blood pressure screening. Blood pressure changes and medicines to control blood pressure can make you feel dizzy.  Strength and balance checks. Your health care provider may recommend certain tests to check your strength and balance while standing, walking, or changing positions.  Foot health exam. Foot pain and numbness, as well as not wearing proper footwear, can make you more likely to have a fall.  Depression screening. You may be more likely to have a fall if you have a fear of falling, feel emotionally low, or feel unable to do activities that you used to do.  Alcohol use screening. Using too much alcohol can affect your balance and may make you more likely to have a fall. What actions can I take to lower my risk of falls? General instructions  Talk with your health care provider about your risks for falling. Tell your health care provider if: ? You fall. Be sure to tell your health care provider about all falls, even ones that seem minor. ? You feel dizzy, sleepy, or off-balance.  Take over-the-counter and prescription medicines only as told by your health care provider. These include any supplements.  Eat a healthy diet and maintain a healthy weight. A healthy diet includes low-fat dairy products, low-fat (lean) meats, and fiber from whole grains, beans, and lots of fruits and vegetables. Home safety  Remove any tripping hazards, such as rugs, cords, and clutter.  Install safety equipment such as grab bars in bathrooms and safety rails on stairs.  Keep rooms and walkways well-lit. Activity   Follow a regular exercise program to stay fit. This will help you maintain your balance. Ask your health care provider what types of exercise are appropriate for you.  If you need a cane or walker, use it as recommended by  your health care provider.  Wear supportive shoes that have nonskid soles. Lifestyle  Do not drink alcohol if your health care provider tells you not to drink.  If you drink alcohol, limit how much you have: ? 0-1 drink a day for women. ? 0-2 drinks a day for men.  Be aware of how much alcohol is in your drink. In the [place] one drink equals one typical bottle of beer (12 oz), one-half glass of wine (5 oz), or one shot of hard liquor (1 oz).  Do not use any products that contain nicotine or tobacco, such as cigarettes and e-cigarettes. If you need help quitting, ask your health care provider. Summary  Having a healthy lifestyle and getting preventive care can help to protect your health and wellness after age 13.  Screening and testing are the best way to find a health problem early and help you avoid having a fall. Early diagnosis and treatment give you the best chance for managing medical conditions that are more common for people who are older than age 36.  Falls are a major cause of broken bones and head injuries in people who are older than age 50. Take precautions to prevent a fall at home.  Work with your health care provider to learn what changes you can make to improve your health and wellness and to prevent falls. This information is not intended to replace advice given to you by your health care provider. Make sure you discuss any questions you have with your health care provider. Document Released: 05/02/2017 Document Revised: 10/10/2018 Document Reviewed: 05/02/2017 Elsevier Patient Education  2020 [employer].

## 2019-01-27 NOTE — Assessment & Plan Note (Signed)
Encouraged limiting added sugars in diet.  

## 2019-01-27 NOTE — Assessment & Plan Note (Signed)

## 2019-01-27 NOTE — Assessment & Plan Note (Signed)
Received durable power of attorney (wife then daughter) - no healthcare info available.

## 2019-01-27 NOTE — Assessment & Plan Note (Signed)
Preventative protocols reviewed and updated unless pt declined. Discussed healthy diet and lifestyle.  

## 2019-01-27 NOTE — Assessment & Plan Note (Signed)
Improvement noted with addition of losartan.

## 2019-01-27 NOTE — Assessment & Plan Note (Signed)
Chronic, improved with addition of losartan. Suggested increasing losartan to 50mg  daily to see if we can discontinue amlodipine (some issues with pedal edema on med). Continue hctz 25mg  daily. Pt agrees with plan, will try this once he is running low on amlodipine (just started 90d supply).

## 2019-01-27 NOTE — Assessment & Plan Note (Signed)
Heard again today. Will rpt echo in 6 months.

## 2019-01-27 NOTE — Progress Notes (Signed)
This visit was conducted in person.  BP 120/80 (BP Location: Left Arm, Patient Position: Sitting, Cuff Size: Normal)   Pulse 92   Temp 98 F (36.7 C) (Temporal)   Ht 5\' 7"  (1.702 m)   Wt 168 lb 1 oz (76.2 kg)   SpO2 97%   BMI 26.32 kg/m    CC: CPE Subjective:    Patient ID: Aaron Evangelist., male    DOB: 04-12-1946, 73 y.o.   MRN: [id number]  HPI: Aaron Staples. is a 73 y.o. male presenting on 01/27/2019 for Medicare Wellness   Did not visit with health coach this year.   See recent notes - recent trouble with hypertension and pedal edema, currently on amlodipine 5mg  daily, hctz 25mg  daily, losartan 25mg  daily. Brings log of BP's showing overall improvement - 119-154/70-80s.   Preventative: COLONOSCOPY Date: 05/2005 1 polyp, int hem, told ok to rpt 10 yrs ([facility]). Cologuard WNL 12/2016 Prostate screening - desired yearly.  Flu shot declines  Pneumovax 2014, prevnar 12/2013  Tdap 12/2011  Zostavax 2013 Shingrix - declines  Advanced directive: Received durable power of attorney (wife then daughter) - no healthcare info available. He is working on getting medical side of things completed.  Seat belt use discussed  Sunscreen use discussed - no changing moles. Sees dermatologist regularly Non smoker Alcohol - seldom Dentist - Q6 mo Eye exam Q6 mo - borderline glaucoma  Lives with wife  Grown children  Occupation: was Government social research officer, now owns landscaping business  Edu: College BA  Activity: stays active at work, walking 2 mi/day, back exercises 30 min every night. Diet: good water, fruits/vegetables some. Follows mediterranean diet.      Relevant past medical, surgical, family and social history reviewed and updated as indicated. Interim medical history since our last visit reviewed. Allergies and medications reviewed and updated. Outpatient Medications Prior to Visit  Medication Sig Dispense Refill  . amLODipine (NORVASC) 5 MG tablet Take 1 tablet  (5 mg total) by mouth daily. 90 tablet 1  . beta carotene w/minerals (OCUVITE) tablet Take 1 tablet by mouth daily.    . cholecalciferol (VITAMIN D) 1000 UNITS tablet Take 1,000 Units by mouth daily.    . Ciclopirox 1 % shampoo     . clindamycin (CLEOCIN T) 1 % external solution     . Multiple Vitamin (MULTIVITAMIN) tablet Take 1 tablet by mouth daily.    . cyclobenzaprine (FLEXERIL) 10 MG tablet Take 1 tablet (10 mg total) by mouth 2 (two) times daily as needed for muscle spasms. 40 tablet 1  . etodolac (LODINE) 400 MG tablet Take 1 tablet (400 mg total) by mouth 2 (two) times daily as needed. 40 tablet 1  . hydrochlorothiazide (HYDRODIURIL) 25 MG tablet Take 1 tablet (25 mg total) by mouth daily. 30 tablet 6  . losartan (COZAAR) 25 MG tablet Take 1 tablet (25 mg total) by mouth daily. 30 tablet 3   No facility-administered medications prior to visit.      Per HPI unless specifically indicated in ROS section below Review of Systems  Constitutional: Negative for activity change, appetite change, chills, fatigue, fever and unexpected weight change.  HENT: Negative for hearing loss.   Eyes: Negative for visual disturbance.  Respiratory: Negative for cough (occasional tickle in throat), chest tightness, shortness of breath and wheezing.   Cardiovascular: Positive for leg swelling (mild). Negative for chest pain and palpitations.  Gastrointestinal: Negative for abdominal distention, abdominal pain, blood in stool, constipation,  diarrhea, nausea and vomiting.  Genitourinary: Negative for difficulty urinating and hematuria.  Musculoskeletal: Negative for arthralgias, myalgias and neck pain.  Skin: Negative for rash.  Neurological: Negative for dizziness, seizures, syncope and headaches.  Hematological: Negative for adenopathy. Bruises/bleeds easily.  Psychiatric/Behavioral: Negative for dysphoric mood. The patient is not nervous/anxious.    Objective:    BP 120/80 (BP Location: Left Arm,  Patient Position: Sitting, Cuff Size: Normal)   Pulse 92   Temp 98 F (36.7 C) (Temporal)   Ht 5\' 7"  (1.702 m)   Wt 168 lb 1 oz (76.2 kg)   SpO2 97%   BMI 26.32 kg/m   Wt Readings from Last 3 Encounters:  01/27/19 168 lb 1 oz (76.2 kg)  10/28/18 166 lb 5 oz (75.4 kg)  10/07/18 169 lb (76.7 kg)    Physical Exam Vitals signs and nursing note reviewed.  Constitutional:      General: He is not in acute distress.    Appearance: Normal appearance. He is well-developed. He is not ill-appearing.  HENT:     Head: Normocephalic and atraumatic.     Right Ear: Hearing, tympanic membrane, ear canal and external ear normal.     Left Ear: Hearing, tympanic membrane, ear canal and external ear normal.     Nose: Nose normal.     Mouth/Throat:     Mouth: Mucous membranes are moist.     Pharynx: Uvula midline. No oropharyngeal exudate or posterior oropharyngeal erythema.  Eyes:     General: No scleral icterus.    Extraocular Movements: Extraocular movements intact.     Conjunctiva/sclera: Conjunctivae normal.     Pupils: Pupils are equal, round, and reactive to light.  Neck:     Musculoskeletal: Normal range of motion and neck supple.  Cardiovascular:     Rate and Rhythm: Normal rate and regular rhythm.     Pulses: Normal pulses.          Radial pulses are 2+ on the right side and 2+ on the left side.     Heart sounds: Murmur (3/6 mid systolic murmur) present.  Pulmonary:     Effort: Pulmonary effort is normal. No respiratory distress.     Breath sounds: Normal breath sounds. No wheezing, rhonchi or rales.  Abdominal:     General: Abdomen is flat. Bowel sounds are normal. There is no distension.     Palpations: Abdomen is soft. There is no mass.     Tenderness: There is no abdominal tenderness. There is no guarding or rebound.     Hernia: No hernia is present.  Genitourinary:    Prostate: Normal. Not enlarged (20gm), not tender and no nodules present.     Rectum: Normal. No mass,  tenderness, anal fissure, external hemorrhoid or internal hemorrhoid. Normal anal tone.  Musculoskeletal: Normal range of motion.  Lymphadenopathy:     Cervical: No cervical adenopathy.  Skin:    General: Skin is warm and dry.     Findings: No rash.  Neurological:     General: No focal deficit present.     Mental Status: He is alert and oriented to person, place, and time.     Comments:  CN grossly intact, station and gait intact Recall 3/3 Calculation [id number]  Psychiatric:        Mood and Affect: Mood normal.        Behavior: Behavior normal.        Thought Content: Thought content normal.  Judgment: Judgment normal.       Results for orders placed or performed in visit on 01/24/19  PSA  Result Value Ref Range   PSA 2.69 0.10 - 4.00 ng/mL  Hemoglobin A1c  Result Value Ref Range   Hgb A1c MFr Bld 6.0 4.6 - 6.5 %  Basic metabolic panel  Result Value Ref Range   Sodium 136 135 - 145 mEq/L   Potassium 4.4 3.5 - 5.1 mEq/L   Chloride 100 96 - 112 mEq/L   CO2 30 19 - 32 mEq/L   Glucose, Bld 94 70 - 99 mg/dL   BUN 15 6 - 23 mg/dL   Creatinine, Ser 1.05 0.40 - 1.50 mg/dL   Calcium 9.3 8.4 - 10.5 mg/dL   GFR 69.53 >60.00 mL/min  Lipid panel  Result Value Ref Range   Cholesterol 144 0 - 200 mg/dL   Triglycerides 97.0 0.0 - 149.0 mg/dL   HDL 46.60 >39.00 mg/dL   VLDL 19.4 0.0 - 40.0 mg/dL   LDL Cholesterol 78 0 - 99 mg/dL   Total CHOL/HDL Ratio 3    NonHDL 97.38    Assessment & Plan:   Problem List Items Addressed This Visit    Prediabetes    Encouraged limiting added sugars in diet.       Mitral valve regurgitation    Heard again today. Will rpt echo in 6 months.       Relevant Medications   losartan (COZAAR) 25 MG tablet   hydrochlorothiazide (HYDRODIURIL) 25 MG tablet   Medicare annual wellness visit, subsequent    I have personally reviewed the Medicare Annual Wellness questionnaire and have noted 1. The patient's medical and social history 2. Their  use of alcohol, tobacco or illicit drugs 3. Their current medications and supplements 4. The patient's functional ability including ADL's, fall risks, home safety risks and hearing or visual impairment. Cognitive function has been assessed and addressed as indicated.  5. Diet and physical activity 6. Evidence for depression or mood disorders The patients weight, height, BMI have been recorded in the chart. I have made referrals, counseling and provided education to the patient based on review of the above and I have provided the pt with a written personalized care plan for preventive services. Provider list updated.. See scanned questionairre as needed for further documentation. Reviewed preventative protocols and updated unless pt declined.       Increased pressure in the eye    Improvement noted with addition of losartan.       Hypertension    Chronic, improved with addition of losartan. Suggested increasing losartan to 50mg  daily to see if we can discontinue amlodipine (some issues with pedal edema on med). Continue hctz 25mg  daily. Pt agrees with plan, will try this once he is running low on amlodipine (just started 90d supply).       Relevant Medications   losartan (COZAAR) 25 MG tablet   hydrochlorothiazide (HYDRODIURIL) 25 MG tablet   Health maintenance examination    Preventative protocols reviewed and updated unless pt declined. Discussed healthy diet and lifestyle.       Cerebral microvascular disease    Consider restarting aspirin 81mg  daily.       Relevant Medications   losartan (COZAAR) 25 MG tablet   hydrochlorothiazide (HYDRODIURIL) 25 MG tablet   Advanced care planning/counseling discussion    Received durable power of attorney (wife then daughter) - no healthcare info available.  Meds ordered this encounter  Medications  . losartan (COZAAR) 25 MG tablet    Sig: Take 1 tablet (25 mg total) by mouth daily.    Dispense:  90 tablet    Refill:  1   . etodolac (LODINE) 400 MG tablet    Sig: Take 1 tablet (400 mg total) by mouth 2 (two) times daily as needed.    Dispense:  40 tablet    Refill:  1  . cyclobenzaprine (FLEXERIL) 10 MG tablet    Sig: Take 1 tablet (10 mg total) by mouth 2 (two) times daily as needed for muscle spasms.    Dispense:  40 tablet    Refill:  1  . hydrochlorothiazide (HYDRODIURIL) 25 MG tablet    Sig: Take 1 tablet (25 mg total) by mouth daily.    Dispense:  90 tablet    Refill:  1   No orders of the defined types were placed in this encounter.   Follow up plan: Return in about 6 months (around 07/30/2019) for follow up visit.  Ria Bush, MD

## 2019-02-10 ENCOUNTER — Ambulatory Visit (INDEPENDENT_AMBULATORY_CARE_PROVIDER_SITE_OTHER): Payer: PPO | Admitting: Family Medicine

## 2019-02-10 ENCOUNTER — Telehealth (INDEPENDENT_AMBULATORY_CARE_PROVIDER_SITE_OTHER): Payer: PPO | Admitting: Family Medicine

## 2019-02-10 ENCOUNTER — Encounter: Payer: Self-pay | Admitting: Family Medicine

## 2019-02-10 ENCOUNTER — Other Ambulatory Visit: Payer: Self-pay

## 2019-02-10 VITALS — BP 125/73 | HR 96 | Temp 101.4°F | Ht 67.0 in | Wt 166.0 lb

## 2019-02-10 DIAGNOSIS — R509 Fever, unspecified: Secondary | ICD-10-CM

## 2019-02-10 DIAGNOSIS — R300 Dysuria: Secondary | ICD-10-CM | POA: Diagnosis not present

## 2019-02-10 DIAGNOSIS — Z20822 Contact with and (suspected) exposure to covid-19: Secondary | ICD-10-CM

## 2019-02-10 NOTE — Assessment & Plan Note (Signed)
New with nonspecific symptoms of nausea, GI upset, HA, loss of appetite, body aches. This after recent trip to [facility]. In setting of Covid pandemic, prudent to check for Covid19 - will go get tested today. Supportive care reviewed - bland diet, tylenol/ibuprofen alternating as needed, rest. Red flags to seek ER care reviewed. Will keep close tabs on patient over next 1-2 days for ongoing symptom update. Pt /wife agree with plan.

## 2019-02-10 NOTE — Progress Notes (Signed)
Virtual visit completed through [employer]. Due to national recommendations of social distancing due to COVID-19, a virtual visit is felt to be most appropriate for this patient at this time. Reviewed limitations of a virtual visit.   Patient location: home, wife Aaron Brown also present on call Provider location: [place] [facility], office If any vitals were documented, they were collected by patient at home unless specified below.    BP 125/73   Pulse 96   Temp (!) 101.4 F (38.6 C)   Ht 5\' 7"  (1.702 m)   Wt 166 lb (75.3 kg)   BMI 26.00 kg/m    CC: fever, nausea/diarrhea, dysuria Subjective:    Patient ID: Aaron Evangelist., male    DOB: 09-Sep-1945, 73 y.o.   MRN: [id number]  HPI: Aaron Merwin. is a 73 y.o. male presenting on 02/10/2019 for Fever (C/o fever-max 101.0, chills, diarrhea and nausea.  Sxs started 02/08/19. Denies any cough or SOB. ) and Dysuria (C/o burning/stinging with urination.  Also, started 02/08/19 but has subsided. )   Recent vacation to [facility] last week, he stayed as socially distant as possible, wearing face mask.   2 days ago had chills, nausea, dysuria (now resolved). Sunday slept most of the day. Sunday night was worse night - malaise, chills, fever. Today no appetite, bloated stomach, nausea, HA, stuffy nose. Tmax 101.5. Body aches. Loose stools today.   Treating at home with tylenol.  No cough, dyspnea, loss of taste or smell sensation, ST. No vomiting or constipation. No lower back pain.  Wife feels well at home.  No known Covid exposure.      Relevant past medical, surgical, family and social history reviewed and updated as indicated. Interim medical history since our last visit reviewed. Allergies and medications reviewed and updated. Outpatient Medications Prior to Visit  Medication Sig Dispense Refill  . amLODipine (NORVASC) 5 MG tablet Take 1 tablet (5 mg total) by mouth daily. 90 tablet 1  . beta carotene w/minerals (OCUVITE)  tablet Take 1 tablet by mouth daily.    . cholecalciferol (VITAMIN D) 1000 UNITS tablet Take 1,000 Units by mouth daily.    . Ciclopirox 1 % shampoo     . clindamycin (CLEOCIN T) 1 % external solution     . cyclobenzaprine (FLEXERIL) 10 MG tablet Take 1 tablet (10 mg total) by mouth 2 (two) times daily as needed for muscle spasms. 40 tablet 1  . etodolac (LODINE) 400 MG tablet Take 1 tablet (400 mg total) by mouth 2 (two) times daily as needed. 40 tablet 1  . hydrochlorothiazide (HYDRODIURIL) 25 MG tablet Take 1 tablet (25 mg total) by mouth daily. 90 tablet 1  . losartan (COZAAR) 25 MG tablet Take 1 tablet (25 mg total) by mouth daily. 90 tablet 1  . Multiple Vitamin (MULTIVITAMIN) tablet Take 1 tablet by mouth daily.     No facility-administered medications prior to visit.      Per HPI unless specifically indicated in ROS section below Review of Systems Objective:    BP 125/73   Pulse 96   Temp (!) 101.4 F (38.6 C)   Ht 5\' 7"  (1.702 m)   Wt 166 lb (75.3 kg)   BMI 26.00 kg/m   Wt Readings from Last 3 Encounters:  02/10/19 166 lb (75.3 kg)  01/27/19 168 lb 1 oz (76.2 kg)  10/28/18 166 lb 5 oz (75.4 kg)     Physical exam: Gen: alert, NAD, not ill  appearing Pulm: speaks in complete sentences without increased work of breathing Psych: normal mood, normal thought content      Assessment & Plan:   Problem List Items Addressed This Visit    Fever - Primary    New with nonspecific symptoms of nausea, GI upset, HA, loss of appetite, body aches. This after recent trip to [facility]. In setting of Covid pandemic, prudent to check for Covid19 - will go get tested today. Supportive care reviewed - bland diet, tylenol/ibuprofen alternating as needed, rest. Red flags to seek ER care reviewed. Will keep close tabs on patient over next 1-2 days for ongoing symptom update. Pt /wife agree with plan.       Relevant Orders   Novel Coronavirus, NAA ([facility])       No orders of the  defined types were placed in this encounter.  Orders Placed This Encounter  Procedures  . Novel Coronavirus, NAA ([facility])    Order Specific Question:   Is this test for diagnosis or screening    Answer:   Diagnosis of ill patient    Order Specific Question:   Symptomatic for COVID-19 as defined by CDC    Answer:   Yes    Order Specific Question:   Date of Symptom Onset    Answer:   02/08/2019    Order Specific Question:   Hospitalized for COVID-19    Answer:   No    Order Specific Question:   Admitted to ICU for COVID-19    Answer:   No    Order Specific Question:   Previously tested for COVID-19    Answer:   No    Order Specific Question:   Resident in a congregate (group) care setting    Answer:   No    Order Specific Question:   Employed in healthcare setting    Answer:   No    I discussed the assessment and treatment plan with the patient. The patient was provided an opportunity to ask questions and all were answered. The patient agreed with the plan and demonstrated an understanding of the instructions. The patient was advised to call back or seek an in-person evaluation if the symptoms worsen or if the condition fails to improve as anticipated.  Follow up plan: No follow-ups on file.  Ria Bush, MD

## 2019-02-10 NOTE — Telephone Encounter (Signed)
Seen today for fever and nonspecific GI symptoms.  Sent for Covid testing.  plz call tomorrow for update on fever and other symptoms.

## 2019-02-10 NOTE — Telephone Encounter (Signed)
Noted  

## 2019-02-11 ENCOUNTER — Encounter: Payer: Self-pay | Admitting: Family Medicine

## 2019-02-11 LAB — NOVEL CORONAVIRUS, NAA: SARS-CoV-2, NAA: NOT DETECTED

## 2019-02-12 ENCOUNTER — Encounter: Payer: Self-pay | Admitting: Family Medicine

## 2019-02-12 ENCOUNTER — Other Ambulatory Visit: Payer: PPO

## 2019-02-12 DIAGNOSIS — R300 Dysuria: Secondary | ICD-10-CM

## 2019-02-12 LAB — POC URINALSYSI DIPSTICK (AUTOMATED)
Bilirubin, UA: NEGATIVE
Glucose, UA: NEGATIVE
Ketones, UA: NEGATIVE
Nitrite, UA: NEGATIVE
Protein, UA: POSITIVE — AB
Spec Grav, UA: 1.01 (ref 1.010–1.025)
Urobilinogen, UA: 0.2 E.U./dL
pH, UA: 7 (ref 5.0–8.0)

## 2019-02-12 MED ORDER — ONDANSETRON HCL 4 MG PO TABS: 4.0000 mg | ORAL_TABLET | Freq: Three times a day (TID) | ORAL | 0 refills | Status: DC | PRN

## 2019-02-12 MED ORDER — SULFAMETHOXAZOLE-TRIMETHOPRIM 800-160 MG PO TABS: 1.0000 | ORAL_TABLET | Freq: Two times a day (BID) | ORAL | 0 refills | Status: DC

## 2019-02-12 NOTE — Telephone Encounter (Addendum)
Spoke with patient. UA with signs of infection. No flank pain. Given systemic symptoms of nausea, fevers/chils, will treat for prostatitis with 2 wk bactrim course.  plz send culture - ordered.

## 2019-02-12 NOTE — Telephone Encounter (Signed)
Left message on vm per dpr asking pt to call back letting us know how he is doing.  What sxs is he still having?

## 2019-02-12 NOTE — Addendum Note (Signed)
Addended by: Ria Bush on: 02/12/2019 02:25 PM   Modules accepted: Orders

## 2019-02-12 NOTE — Telephone Encounter (Signed)
See other mychart message.

## 2019-02-12 NOTE — Telephone Encounter (Signed)
Spoke with pt relaying Dr. Synthia Innocent message.  Pt verbalizes understanding and will drop off sample. (COVID neg, backdoor info given)

## 2019-02-12 NOTE — Telephone Encounter (Signed)
Best number [phone]  [facility] [address] [place]  Pt wanted to know what he could do and can you call him in something  He is complaining about nausea and discomfort urinating

## 2019-02-12 NOTE — Addendum Note (Signed)
Addended by: Brenton Grills on: 0/60/0459 97:74 PM   Modules accepted: Orders

## 2019-02-12 NOTE — Telephone Encounter (Signed)
Pt returning call.  Asked how he is feeling.  Pt states he feels a little better but still having some nausea, can't eat much and discomfort when urinating.  Pt requests something be sent the pharmacy.  Says he sent a MyChart message to Dr. Darnell Level.

## 2019-02-12 NOTE — Telephone Encounter (Signed)
Pt dropped of urine sample.  Did UA and documented results.  Sample has been spun and drawn up for ucx, if needed.

## 2019-02-12 NOTE — Telephone Encounter (Signed)
See phone note

## 2019-02-12 NOTE — Addendum Note (Signed)
Addended by: Ria Bush on: 02/12/2019 09:56 AM   Modules accepted: Orders

## 2019-02-12 NOTE — Telephone Encounter (Signed)
zofran sent for nausea.  plz have him collect urine specimen in sterile container and have wife bring in to run urinalysis asap.

## 2019-02-14 LAB — URINE CULTURE
MICRO NUMBER:: 763857
SPECIMEN QUALITY:: ADEQUATE

## 2019-03-31 ENCOUNTER — Encounter: Payer: Self-pay | Admitting: Family Medicine

## 2019-03-31 ENCOUNTER — Ambulatory Visit (INDEPENDENT_AMBULATORY_CARE_PROVIDER_SITE_OTHER): Payer: PPO | Admitting: Family Medicine

## 2019-03-31 ENCOUNTER — Ambulatory Visit (INDEPENDENT_AMBULATORY_CARE_PROVIDER_SITE_OTHER)
Admission: RE | Admit: 2019-03-31 | Discharge: 2019-03-31 | Disposition: A | Discharge status: Home / Self Care | Payer: PPO | Source: Ambulatory Visit | Attending: Family Medicine | Admitting: Family Medicine

## 2019-03-31 ENCOUNTER — Other Ambulatory Visit: Payer: Self-pay

## 2019-03-31 VITALS — BP 128/68 | HR 92 | Temp 98.4°F | Resp 14 | Ht 67.0 in | Wt 166.0 lb

## 2019-03-31 DIAGNOSIS — M5442 Lumbago with sciatica, left side: Secondary | ICD-10-CM

## 2019-03-31 DIAGNOSIS — Z23 Encounter for immunization: Secondary | ICD-10-CM | POA: Diagnosis not present

## 2019-03-31 DIAGNOSIS — M545 Low back pain: Secondary | ICD-10-CM | POA: Diagnosis not present

## 2019-03-31 MED ORDER — PREDNISONE 20 MG PO TABS: ORAL_TABLET | ORAL | 0 refills | Status: DC

## 2019-03-31 MED ORDER — METHOCARBAMOL 500 MG PO TABS: 500.0000 mg | ORAL_TABLET | Freq: Three times a day (TID) | ORAL | 1 refills | Status: DC | PRN

## 2019-03-31 NOTE — Patient Instructions (Addendum)
I think symptoms are coming from lower lumbar spine known herniated discs, but fortunately no signs of significant pinching today.  Lumbar xrays today.  Treat with methocarbamol muscle relaxant as needed and prednisone taper.  If not improving, let me know for physical therapy referral.  If worsening, new weakness of leg, trouble controlling bowels/bladder, or worsening numbness, let me know for MRI.

## 2019-03-31 NOTE — Progress Notes (Addendum)
This visit was conducted in person.  BP 128/68   Pulse 92   Temp 98.4 F (36.9 C)   Resp 14   Ht 5\' 7"  (1.702 m)   Wt 166 lb (75.3 kg)   BMI 26.00 kg/m    CC: lower back pain Subjective:    Patient ID: Aaron Brown., male    DOB: October 29, 1945, 73 y.o.   MRN: [id number]  [name] is a 73 y.o. male presenting on 03/31/2019 for Back Pain (started on 02/22/2019. Left lower back area)   1+ month h/o L lower back pain that started after forceful sneeze. Self treated with etodolac and cyclobenzaprine with some benefit. Also using ice/heat, TENS unit. However notes persistent dull L back pain that is aggravated with persistent physical use. Now notes L lateral leg throbbing pain, tightness and some paresthesias and weakness. Riding in a car uncomfortable, especially riding long distances.   No fevers/chills, falls or inciting injury, saddle anesthesia, bowel/bladder accidents.   Known multilevel herniated discs by MRI 11/2014.      Relevant past medical, surgical, family and social history reviewed and updated as indicated. Interim medical history since our last visit reviewed. Allergies and medications reviewed and updated. Outpatient Medications Prior to Visit  Medication Sig Dispense Refill  . amLODipine (NORVASC) 5 MG tablet Take 1 tablet (5 mg total) by mouth daily. 90 tablet 1  . beta carotene w/minerals (OCUVITE) tablet Take 1 tablet by mouth daily.    . cholecalciferol (VITAMIN D) 1000 UNITS tablet Take 1,000 Units by mouth daily.    . Ciclopirox 1 % shampoo     . clindamycin (CLEOCIN T) 1 % external solution     . etodolac (LODINE) 400 MG tablet Take 1 tablet (400 mg total) by mouth 2 (two) times daily as needed. 40 tablet 1  . hydrochlorothiazide (HYDRODIURIL) 25 MG tablet Take 1 tablet (25 mg total) by mouth daily. 90 tablet 1  . losartan (COZAAR) 25 MG tablet Take 1 tablet (25 mg total) by mouth daily. 90 tablet 1  . Multiple Vitamin (MULTIVITAMIN)  tablet Take 1 tablet by mouth daily.    . cyclobenzaprine (FLEXERIL) 10 MG tablet Take 1 tablet (10 mg total) by mouth 2 (two) times daily as needed for muscle spasms. 40 tablet 1  . ondansetron (ZOFRAN) 4 MG tablet Take 1 tablet (4 mg total) by mouth every 8 (eight) hours as needed for nausea or vomiting. 20 tablet 0  . sulfamethoxazole-trimethoprim (BACTRIM DS) 800-160 MG tablet Take 1 tablet by mouth 2 (two) times daily. 28 tablet 0   No facility-administered medications prior to visit.      Per HPI unless specifically indicated in ROS section below Review of Systems Objective:    BP 128/68   Pulse 92   Temp 98.4 F (36.9 C)   Resp 14   Ht 5\' 7"  (1.702 m)   Wt 166 lb (75.3 kg)   BMI 26.00 kg/m   Wt Readings from Last 3 Encounters:  03/31/19 166 lb (75.3 kg)  02/10/19 166 lb (75.3 kg)  01/27/19 168 lb 1 oz (76.2 kg)    Physical Exam Vitals signs and nursing note reviewed.  Constitutional:      General: He is not in acute distress.    Appearance: Normal appearance. He is not ill-appearing.  Musculoskeletal: Normal range of motion.        General: No tenderness.     Comments:  No pain midline  spine No paraspinous mm tenderness Neg SLR bilaterally. No pain with int/ext rotation at hip. Neg FABER. No pain at SIJ, GTB or sciatic notch bilaterally.   Skin:    General: Skin is warm and dry.  Neurological:     General: No focal deficit present.     Mental Status: He is alert.     Sensory: Sensation is intact.     Motor: Motor function is intact.     Coordination: Coordination is intact.     Gait: Gait is intact.     Deep Tendon Reflexes:     Reflex Scores:      Patellar reflexes are 2+ on the right side and 2+ on the left side.    Comments:  2+ DTRs 5/5 strength BLE Sensation intact BLE       Assessment & Plan:   Problem List Items Addressed This Visit    Lower back pain - Primary    H/o lumbar surgeries x2 remotely.  Now with 1+ month of L radiculopathy  symptoms, failing outpatient management of NSAID, muscle relaxant. Check lumbar films today. Overall benign exam today. Will treat with prednisone taper, refill robaxin muscle relaxant. Update with effect - if ongoing pain will recommend formal outpatient PT course. Red flags to notify us sooner reviewed - if worsening numbness/pain or new weakness or other red flags, would recommend updating lumbar MRI. Pt agrees with plan.       Relevant Medications   methocarbamol (ROBAXIN) 500 MG tablet   predniSONE (DELTASONE) 20 MG tablet   Other Relevant Orders   DG Lumbar Spine Complete    Other Visit Diagnoses    Need for influenza vaccination       Relevant Orders   Flu Vaccine QUAD High Dose(Fluad) (Completed)       Meds ordered this encounter  Medications  . methocarbamol (ROBAXIN) 500 MG tablet    Sig: Take 1 tablet (500 mg total) by mouth 3 (three) times daily as needed for muscle spasms (sedation precautions).    Dispense:  40 tablet    Refill:  1  . predniSONE (DELTASONE) 20 MG tablet    Sig: Take two tablets daily for 3 days followed by one tablet daily for 4 days    Dispense:  10 tablet    Refill:  0   Orders Placed This Encounter  Procedures  . DG Lumbar Spine Complete    Standing Status:   Future    Number of Occurrences:   1    Standing Expiration Date:   05/30/2020    Order Specific Question:   Reason for Exam (SYMPTOM  OR DIAGNOSIS REQUIRED)    Answer:   5 wks lower back pain with L radiculopathy    Order Specific Question:   Preferred imaging location?    Answer:   [facility]    Order Specific Question:   Radiology Contrast Protocol - do NOT remove file path    Answer:   \\[email or web address]s.pdf  . Flu Vaccine QUAD High Dose(Fluad)    Patient Instructions  I think symptoms are coming from lower lumbar spine known herniated discs, but fortunately no signs of significant pinching today.  Lumbar xrays today.  Treat with  methocarbamol muscle relaxant as needed and prednisone taper.  If not improving, let me know for physical therapy referral.  If worsening, new weakness of leg, trouble controlling bowels/bladder, or worsening numbness, let me know for MRI.    Follow up plan: Return if symptoms worsen  or fail to improve.  Ria Bush, MD

## 2019-03-31 NOTE — Assessment & Plan Note (Addendum)
H/o lumbar surgeries x2 remotely.  Now with 1+ month of L radiculopathy symptoms, failing outpatient management of NSAID, muscle relaxant. Check lumbar films today. Overall benign exam today. Will treat with prednisone taper, refill robaxin muscle relaxant. Update with effect - if ongoing pain will recommend formal outpatient PT course. Red flags to notify us sooner reviewed - if worsening numbness/pain or new weakness or other red flags, would recommend updating lumbar MRI. Pt agrees with plan.

## 2019-03-31 NOTE — Addendum Note (Signed)
Addended by: Ria Bush on: 03/31/2019 06:19 PM   Modules accepted: Level of Service

## 2019-04-22 DIAGNOSIS — L570 Actinic keratosis: Secondary | ICD-10-CM | POA: Diagnosis not present

## 2019-04-22 DIAGNOSIS — Z85828 Personal history of other malignant neoplasm of skin: Secondary | ICD-10-CM | POA: Diagnosis not present

## 2019-04-22 DIAGNOSIS — L820 Inflamed seborrheic keratosis: Secondary | ICD-10-CM | POA: Diagnosis not present

## 2019-04-22 DIAGNOSIS — L821 Other seborrheic keratosis: Secondary | ICD-10-CM | POA: Diagnosis not present

## 2019-05-04 ENCOUNTER — Encounter: Payer: Self-pay | Admitting: Podiatry

## 2019-05-06 ENCOUNTER — Ambulatory Visit: Payer: PPO | Admitting: Podiatry

## 2019-05-06 ENCOUNTER — Ambulatory Visit (INDEPENDENT_AMBULATORY_CARE_PROVIDER_SITE_OTHER): Payer: PPO

## 2019-05-06 ENCOUNTER — Encounter: Payer: Self-pay | Admitting: Podiatry

## 2019-05-06 ENCOUNTER — Other Ambulatory Visit: Payer: Self-pay

## 2019-05-06 DIAGNOSIS — M2011 Hallux valgus (acquired), right foot: Secondary | ICD-10-CM | POA: Diagnosis not present

## 2019-05-06 DIAGNOSIS — M2042 Other hammer toe(s) (acquired), left foot: Secondary | ICD-10-CM

## 2019-05-06 DIAGNOSIS — M2041 Other hammer toe(s) (acquired), right foot: Secondary | ICD-10-CM

## 2019-05-06 MED ORDER — MUPIROCIN 2 % EX OINT: 1.0000 "application " | TOPICAL_OINTMENT | Freq: Two times a day (BID) | CUTANEOUS | 1 refills | Status: DC

## 2019-05-12 NOTE — Progress Notes (Signed)
HPI: 73 y.o. male presenting today as a new patient although he has been established over 3 years ago for evaluation of a tender left third toe as well as the great toe to the right foot.  Is been ongoing for several years now.  He is concerned about the third toe hammertoe because there is a callus on the tip of the toe.  Regarding his right foot he is concerned for bunion deformity.  Aggravated by shoes rubbing.  He has tried multiple different shoes without any significant alleviation he also has tried a buttress pad under the toes to the left foot.  None of these conservative treatments have been successful in providing any sort of lasting alleviation of symptoms for the patient  Past Medical History:  Diagnosis Date  . Asymmetrical left sensorineural hearing loss 12/2013   s/p MRI pending hearing aides [name])  . Blood in stool    in the past; ? hemorrhoids  . Cerebral microvascular disease 12/2013   by MRI, referred to neuro by ENT  . Colon polyp 05/2005   ?hyperplastic  . History of chicken pox   . Hypertension   . Increased pressure in the eye    Dr. Gloriann Loan  . Lower back pain    bulging disc with spinal stenosis L4/5 s/p surgery x2  . Prediabetes    A1c 6.0 (2012)  . Seasonal allergies   . Squamous acanthoma of face   . Squamous cell cancer of skin of hand      Physical Exam: General: The patient is alert and oriented x3 in no acute distress.  Dermatology: Skin is warm, dry and supple bilateral lower extremities. Negative for open lesions or macerations.  Hyperkeratotic callus lesion noted to the distal tuft of the left third toe.  Vascular: Palpable pedal pulses bilaterally. No edema or erythema noted. Capillary refill within normal limits.  Neurological: Epicritic and protective threshold grossly intact bilaterally.   Musculoskeletal Exam: Range of motion within normal limits to all pedal and ankle joints bilateral. Muscle strength 5/5 in all groups bilateral.   Contracture of the PIPJ contributory to hammertoe to the left third digit.  This is semireducible.  Hallux abductus noted to the right great toe consistent with a bunion deformity.  Hammertoes noted 2-5 bilateral.  Radiographic Exam:  Normal osseous mineralization. Joint spaces preserved. No fracture/dislocation/boney destruction.    Assessment: 1.  Hammertoe third digit left foot 2.  Bunion deformity right foot  Plan of Care:  1. Patient evaluated. X-Rays reviewed.  2.  Today we discussed a possible flexor tenotomy to alleviate pressure from the distal tuft of the third toe left foot.  The patient agrees.  Digital block was utilized prior to the procedure consisting of 3 mL of 2% lidocaine plain.  The toe was prepped in aseptic manner surgical #11 blade was utilized to create a transverse tenotomy at the plantar aspect sulcus of the third toe.  Dry sterile dressing was applied. 3.  Postoperative shoe provided. 4.  Return to clinic in 1 week to discuss additional surgery to the hammertoes bilaterally as well as the bunion to the right foot and possible mallet toe left.  *Goes by Bretta Bang, DPM [facility]  Dr. Edrick Kins, DPM    [address]  [place], [place] [address]                Office [phone]  Fax [phone]

## 2019-05-13 ENCOUNTER — Other Ambulatory Visit: Payer: Self-pay

## 2019-05-13 ENCOUNTER — Ambulatory Visit: Payer: PPO | Admitting: Podiatry

## 2019-05-13 ENCOUNTER — Encounter: Payer: Self-pay | Admitting: Podiatry

## 2019-05-13 DIAGNOSIS — M2041 Other hammer toe(s) (acquired), right foot: Secondary | ICD-10-CM

## 2019-05-13 DIAGNOSIS — M2042 Other hammer toe(s) (acquired), left foot: Secondary | ICD-10-CM

## 2019-05-13 DIAGNOSIS — M2011 Hallux valgus (acquired), right foot: Secondary | ICD-10-CM

## 2019-05-13 DIAGNOSIS — M205X2 Other deformities of toe(s) (acquired), left foot: Secondary | ICD-10-CM

## 2019-05-13 NOTE — Patient Instructions (Signed)
Pre-Operative Instructions  Congratulations, you have decided to take an important step towards improving your quality of life.  You can be assured that the doctors and staff at [facility] will be with you every step of the way.  Here are some important things you should know:  1. Plan to be at the surgery center/hospital at least 1 (one) hour prior to your scheduled time, unless otherwise directed by the surgical center/hospital staff.  You must have a responsible adult accompany you, remain during the surgery and drive you home.  Make sure you have directions to the surgical center/hospital to ensure you arrive on time. 2. If you are having surgery at [facility] or [facility], you will need a copy of your medical history and physical form from your family physician within one month prior to the date of surgery. We will give you a form for your primary physician to complete.  3. We make every effort to accommodate the date you request for surgery.  However, there are times where surgery dates or times have to be moved.  We will contact you as soon as possible if a change in schedule is required.   4. No aspirin/ibuprofen for one week before surgery.  If you are on aspirin, any non-steroidal anti-inflammatory medications (Mobic, Aleve, Ibuprofen) should not be taken seven (7) days prior to your surgery.  You make take Tylenol for pain prior to surgery.  5. Medications - If you are taking daily heart and blood pressure medications, seizure, reflux, allergy, asthma, anxiety, pain or diabetes medications, make sure you notify the surgery center/hospital before the day of surgery so they can tell you which medications you should take or avoid the day of surgery. 6. No food or drink after midnight the night before surgery unless directed otherwise by surgical center/hospital staff. 7. No alcoholic beverages 24-hours prior to surgery.  No smoking 24-hours prior or 24-hours after  surgery. 8. Wear loose pants or shorts. They should be loose enough to fit over bandages, boots, and casts. 9. Don't wear slip-on shoes. Sneakers are preferred. 10. Bring your boot with you to the surgery center/hospital.  Also bring crutches or a walker if your physician has prescribed it for you.  If you do not have this equipment, it will be provided for you after surgery. 11. If you have not been contacted by the surgery center/hospital by the day before your surgery, call to confirm the date and time of your surgery. 12. Leave-time from work may vary depending on the type of surgery you have.  Appropriate arrangements should be made prior to surgery with your employer. 13. Prescriptions will be provided immediately following surgery by your doctor.  Fill these as soon as possible after surgery and take the medication as directed. Pain medications will not be refilled on weekends and must be approved by the doctor. 14. Remove nail polish on the operative foot and avoid getting pedicures prior to surgery. 15. Wash the night before surgery.  The night before surgery wash the foot and leg well with water and the antibacterial soap provided. Be sure to pay special attention to beneath the toenails and in between the toes.  Wash for at least three (3) minutes. Rinse thoroughly with water and dry well with a towel.  Perform this wash unless told not to do so by your physician.  Enclosed: 1 Ice pack (please put in freezer the night before surgery)   1 Hibiclens skin cleaner     Pre-op instructions  If you have any questions regarding the instructions, please do not hesitate to call our office.  [place]: [address], [place], [place] [address] -- [phone]  [place]: [address] , [place] [address] -- [phone]  [place]: [address]  [place], [place] [address] -- [phone]   Website: [email or web address] 

## 2019-05-15 NOTE — Progress Notes (Signed)
   Subjective: 73 y.o. male presenting today for follow up evaluation of hammertoes and bunions. He is interested in surgical intervention at this time. He has taken OTC pain medication and worn supportive shoes for treatment. He denies any relief. Walking and being on his feet increases the pain. Patient is here for further evaluation and treatment.   Past Medical History:  Diagnosis Date  . Asymmetrical left sensorineural hearing loss 12/2013   s/p MRI pending hearing aides [name])  . Blood in stool    in the past; ? hemorrhoids  . Cerebral microvascular disease 12/2013   by MRI, referred to neuro by ENT  . Colon polyp 05/2005   ?hyperplastic  . History of chicken pox   . Hypertension   . Increased pressure in the eye    Dr. Gloriann Loan  . Lower back pain    bulging disc with spinal stenosis L4/5 s/p surgery x2  . Prediabetes    A1c 6.0 (2012)  . Seasonal allergies   . Squamous acanthoma of face   . Squamous cell cancer of skin of hand       Objective: Physical Exam General: The patient is alert and oriented x3 in no acute distress.  Dermatology: Skin is cool, dry and supple bilateral lower extremities. Negative for open lesions or macerations.  Vascular: Palpable pedal pulses bilaterally. No edema or erythema noted. Capillary refill within normal limits.  Neurological: Epicritic and protective threshold grossly intact bilaterally.   Musculoskeletal Exam: Clinical evidence of bunion deformity noted to the respective foot. There is moderate pain on palpation range of motion of the first MPJ. Lateral deviation of the hallux noted consistent with hallux abductovalgus. Hammertoe contracture also noted on clinical exam to digits 2-5 of the left foot; digits 2-3 right. Symptomatic pain on palpation and range of motion also noted to the metatarsal phalangeal joints of the respective hammertoe digits.   Contracture at th MPJ and IPJ noted to the left great toe.   Radiographic Exam:  Increased intermetatarsal angle greater than 15 with a hallux abductus angle greater than 30 noted on AP view. Moderate degenerative changes noted within the first MPJ. Contracture deformity also noted to the interphalangeal joints and MPJs of the digits of the respective hammertoes.    Assessment: 1. HAV w/ bunion deformity right 2. Hammertoe deformity digits 2-5 left; 2-3 right 3. Mallet toe left     Plan of Care:  1. Patient was evaluated. 2. Today we discussed the conservative versus surgical management of the presenting pathology. The patient opts for surgical management. All possible complications and details of the procedure were explained. All patient questions were answered. No guarantees were expressed or implied. 3. Authorization for surgery was initiated today. Surgery will consist of hallux IPJ arthrodesis left; MPJ capsulotomy left; PIPJ arthroplasty 2-5 left.  4. CAM boot dispensed.  5. Return to clinic one week post op.    Edrick Kins, DPM [facility]  Dr. Edrick Kins, [address]                                        [place], [place] [address]                Office [phone]  Fax [phone]

## 2019-05-22 ENCOUNTER — Telehealth: Payer: Self-pay | Admitting: *Deleted

## 2019-05-22 NOTE — Telephone Encounter (Signed)
"  I need to schedule a surgery with Dr. Amalia Hailey.  I saw him November 10.  I prefer an early appointment, early in the morning at 8:30 am or so on a Tuesday or a Wednesday."  I am returning your call.  You want to schedule your surgery?  "Yes, I'd prefer early morning on a Tuesday or Wednesday."  Dr. Amalia Hailey does surgeries on Thursdays.  "I guess that knocks that out, schedule me for a Thursday."  Do you have a date that you would like?  "I'd like sometime in January."  Dr. Amalia Hailey can do it on January 21 or 28, 2020.  "Let's do it on the first date.  What time?"  I can't give you an arrival time.  Someone from the surgical center will give you a call a day or two prior to your surgery date and will give you your arrival time.  "Alright, I'll put that on my calendar.  Thanks for calling me back Trishia Cuthrell."

## 2019-05-22 NOTE — Telephone Encounter (Signed)
"  I'm a patient of Dr. Daylene Katayama.  I just had an office visit with him on November 10.  I need to talk to you about scheduling surgery for my left foot.  Dr. Amalia Hailey will be doing the surgery."

## 2019-05-28 ENCOUNTER — Other Ambulatory Visit: Payer: Self-pay

## 2019-06-10 DIAGNOSIS — H40153 Residual stage of open-angle glaucoma, bilateral: Secondary | ICD-10-CM | POA: Diagnosis not present

## 2019-07-10 ENCOUNTER — Telehealth: Payer: Self-pay | Admitting: *Deleted

## 2019-07-10 NOTE — Telephone Encounter (Signed)
"  I'm scheduled for surgery on January 21 with Dr. Amalia Hailey.  I think I'm going to hold off on having that for now.  It seems like this Covid is getting worse."  I will call the surgical center and cancel it.  I will let Dr. Amalia Hailey know.  "I'll call back later to reschedule it when things calm down."  I called Caren Griffins, scheduler at [facility], and canceled the surgery for 07/24/2019.

## 2019-07-19 ENCOUNTER — Other Ambulatory Visit: Payer: Self-pay | Admitting: Family Medicine

## 2019-07-22 ENCOUNTER — Telehealth: Payer: Self-pay | Admitting: *Deleted

## 2019-07-22 NOTE — Telephone Encounter (Signed)
DOS 07/24/2019 HAMMER TOE REPAIR 2-5 - 28285, HALLUX IPJ FUSION - [id number], AND CAPSULOTOMY MPJ RELEASE JOINT 1ST - [id number] OF THE LEFT FOOT  HTA: Eligibility Date - 07/04/2015 -   Co-pay - $225 Co-insurance - 80%  Call ref# [id number]

## 2019-07-27 ENCOUNTER — Other Ambulatory Visit: Payer: Self-pay | Admitting: Family Medicine

## 2019-07-31 ENCOUNTER — Ambulatory Visit: Payer: PPO | Admitting: Family Medicine

## 2019-08-28 DIAGNOSIS — H1045 Other chronic allergic conjunctivitis: Secondary | ICD-10-CM | POA: Diagnosis not present

## 2019-09-28 ENCOUNTER — Other Ambulatory Visit: Payer: Self-pay | Admitting: Family Medicine

## 2019-10-20 ENCOUNTER — Other Ambulatory Visit: Payer: Self-pay | Admitting: Family Medicine

## 2019-10-21 ENCOUNTER — Other Ambulatory Visit: Payer: Self-pay

## 2019-10-21 ENCOUNTER — Ambulatory Visit: Payer: PPO | Admitting: Dermatology

## 2019-10-21 DIAGNOSIS — Z85828 Personal history of other malignant neoplasm of skin: Secondary | ICD-10-CM

## 2019-10-21 DIAGNOSIS — Z1283 Encounter for screening for malignant neoplasm of skin: Secondary | ICD-10-CM

## 2019-10-21 DIAGNOSIS — L570 Actinic keratosis: Secondary | ICD-10-CM | POA: Diagnosis not present

## 2019-10-21 DIAGNOSIS — D1800 Hemangioma unspecified site: Secondary | ICD-10-CM | POA: Diagnosis not present

## 2019-10-21 DIAGNOSIS — D229 Melanocytic nevi, unspecified: Secondary | ICD-10-CM | POA: Diagnosis not present

## 2019-10-21 DIAGNOSIS — C44311 Basal cell carcinoma of skin of nose: Secondary | ICD-10-CM | POA: Diagnosis not present

## 2019-10-21 DIAGNOSIS — L821 Other seborrheic keratosis: Secondary | ICD-10-CM | POA: Diagnosis not present

## 2019-10-21 DIAGNOSIS — D225 Melanocytic nevi of trunk: Secondary | ICD-10-CM | POA: Diagnosis not present

## 2019-10-21 DIAGNOSIS — L814 Other melanin hyperpigmentation: Secondary | ICD-10-CM

## 2019-10-21 DIAGNOSIS — L578 Other skin changes due to chronic exposure to nonionizing radiation: Secondary | ICD-10-CM | POA: Diagnosis not present

## 2019-10-21 DIAGNOSIS — D485 Neoplasm of uncertain behavior of skin: Secondary | ICD-10-CM

## 2019-10-21 NOTE — Progress Notes (Signed)
Follow-Up Visit   Subjective  Aaron Brown. is a 74 y.o. male who presents for the following: Annual Exam.  Patient here today for UBSE. He has a history of BCC, SCC and AK's. He was seen 6 months ago and treated for AK's. Right inferior mandible was treated with LN2 and on July 27, 2019 he used 5FU/calcipotriene for 5-7 days to cheeks, temples, nose and frontal scalp with a good reaction per patient.  There is a "squiggly" area on his chest that patient would like re-evaluated. Previously diagnosed as a SK. Patient advises there is a spot on the left side of nose that has a scab and will not heal. Present for 4-6 weeks and does bleed a small amount.   The following portions of the chart were reviewed this encounter and updated as appropriate:     Review of Systems:  No other skin or systemic complaints except as noted in HPI or Assessment and Plan.  Objective  Well appearing patient in no apparent distress; mood and affect are within normal limits.  All skin waist up examined.  Objective  L forehead x 2, scalp x 2, L hand x 2, L forearm x 2, R hand x 4, R forearm x 3 (15): Erythematous thin papules/macules with gritty scale. R inferior mandible is clear (Cryotherapy last visit)  Objective  Right Hand: Well healed scar with no evidence of recurrence  Objective  Right Chin: Well healed scar with no evidence of recurrence.   Objective  Left Ala Crease: 3.53mm pink crusted papule     Objective  Right Inferior Breast: 3.11mm medium dark brown macule  Left Abdomen: 2.35mm medium dark brown macule  Objective  Left sternum: 0.45mm waxy lobulated tan papule   Assessment & Plan  AK (actinic keratosis) (15) L forehead x 2, scalp x 2, L hand x 2, L forearm x 2, R hand x 4, R forearm x 3  Once areas healed from LN2 may restart 5FU/calcipotriene BID x 7-10 days to arms and scalp. If no reaction continue for up to 2 weeks. Patient has medication.  Avoid sun exposure  while doing treatment.  Destruction of lesion - L forehead x 2, scalp x 2, L hand x 2, L forearm x 2, R hand x 4, R forearm x 3  Destruction method: cryotherapy   Destruction method comment:  Electrodessication Informed consent: discussed and consent obtained   Lesion destroyed using liquid nitrogen: Yes   Region frozen until ice ball extended beyond lesion: Yes   Outcome: patient tolerated procedure well with no complications   Post-procedure details: wound care instructions given   Additional details:  Prior to procedure, discussed risks of blister formation, small wound, skin dyspigmentation, or rare scar following cryotherapy.  History of SCC (squamous cell carcinoma) of skin Right Hand  No evidence of recurrence, call clinic for new or changing lesions.   History of basal cell carcinoma (BCC) Right Chin  No evidence of recurrence, call clinic for new or changing lesions.   Neoplasm of uncertain behavior of skin Left Ala Crease  Skin / nail biopsy Type of biopsy: tangential   Informed consent: discussed and consent obtained   Patient was prepped and draped in usual sterile fashion: Area prepped with alcohol. Anesthesia: the lesion was anesthetized in a standard fashion   Anesthetic:  1% lidocaine w/ epinephrine 1-100,000 buffered w/ 8.4% NaHCO3 Instrument used: flexible razor blade   Hemostasis achieved with: pressure, aluminum chloride and electrodesiccation  Outcome: patient tolerated procedure well   Post-procedure details: wound care instructions given   Post-procedure details comment:  Ointment and small bandage applied  Specimen 1 - Surgical pathology Differential Diagnosis: AK r/o BCC Check Margins: No 0.90mm pink crusted papule  If + for [facility] refer to Moh's vs EDC. Discussed both with patient, including resulting scar and risk of recurrence.  Patient prefers [place].   Nevus (2) Right Inferior Breast; Left Abdomen  Benign-appearing.  Observation.  Call clinic  for new or changing moles.  Recommend daily use of broad spectrum spf 30+ sunscreen to sun-exposed areas.    Seborrheic keratosis Left sternum  Benign-appearing.  Observation.  Call clinic for new or changing moles.  Recommend daily use of broad spectrum spf 30+ sunscreen to sun-exposed areas.   Seborrheic Keratoses - Stuck-on, waxy, tan-brown papules and plaques  - Discussed benign etiology and prognosis. - Observe - Call for any changes  Lentigines - Scattered tan macules - Discussed due to sun exposure - Benign, observe - Call for any changes  Actinic Damage - diffuse scaly erythematous macules with underlying dyspigmentation - Recommend daily broad spectrum sunscreen SPF 30+ to sun-exposed areas, reapply every 2 hours as needed.  - Call for new or changing lesions.  Hemangiomas - Red papules - Discussed benign nature - Observe - Call for any changes  Melanocytic Nevi - Tan-brown and/or pink-flesh-colored symmetric macules and papules - Benign appearing on exam today - Observation - Call clinic for new or changing moles - Recommend daily use of broad spectrum spf 30+ sunscreen to sun-exposed areas.   Return in about 6 months (around 04/21/2020) for AK FOLLOW UP.  Graciella Belton, RMA, am acting as scribe for Brendolyn Patty, MD .  Documentation: I have reviewed the above documentation for accuracy and completeness, and I agree with the above.  Brendolyn Patty, MD

## 2019-10-21 NOTE — Patient Instructions (Addendum)
Wound Care Instructions  1. Cleanse wound gently with soap and water once a day then pat dry with clean gauze. Apply a thing coat of Petrolatum (petroleum jelly, "Vaseline") over the wound (unless you have an allergy to this). We recommend that you use a new, sterile tube of Vaseline. Do not pick or remove scabs. Do not remove the yellow or white "healing tissue" from the base of the wound.  2. Cover the wound with fresh, clean, nonstick gauze and secure with paper tape. You may use Band-Aids in place of gauze and tape if the would is small enough, but would recommend trimming much of the tape off as there is often too much. Sometimes Band-Aids can irritate the skin.  3. You should call the office for your biopsy report after 1 week if you have not already been contacted.  4. If you experience any problems, such as abnormal amounts of bleeding, swelling, significant bruising, significant pain, or evidence of infection, please call the office immediately.  5. FOR ADULT SURGERY PATIENTS: If you need something for pain relief you may take 1 extra strength Tylenol (acetaminophen) AND 2 Ibuprofen (200mg each) together every 4 hours as needed for pain. (do not take these if you are allergic to them or if you have a reason you should not take them.) Typically, you may only need pain medication for 1 to 3 days.    Recommend daily broad spectrum sunscreen SPF 30+ to sun-exposed areas, reapply every 2 hours as needed. Call for new or changing lesions.  Cryotherapy Aftercare  . Wash gently with soap and water everyday.   . Apply Vaseline and Band-Aid daily until healed.    

## 2019-10-27 ENCOUNTER — Telehealth: Payer: Self-pay

## 2019-10-27 NOTE — Telephone Encounter (Signed)
-----   Message from Brendolyn Patty, MD sent at 10/27/2019 12:39 PM EDT ----- Skin , left ala crease BASAL CELL CARCINOMA, KERATOTIC PATTERN, BASE INVOLVED  Discussed Mohs vrs EDC at time of visit.  Pt prefers EDC- please review and confirm.

## 2019-10-27 NOTE — Telephone Encounter (Signed)
Advised pt of bx results and scheduled him for 11/17/2019 at 11am for EDC./sh

## 2019-11-17 ENCOUNTER — Ambulatory Visit: Payer: PPO | Admitting: Dermatology

## 2019-11-17 ENCOUNTER — Other Ambulatory Visit: Payer: Self-pay

## 2019-11-17 ENCOUNTER — Encounter: Payer: Self-pay | Admitting: Dermatology

## 2019-11-17 DIAGNOSIS — Z85828 Personal history of other malignant neoplasm of skin: Secondary | ICD-10-CM | POA: Diagnosis not present

## 2019-11-17 DIAGNOSIS — C4492 Squamous cell carcinoma of skin, unspecified: Secondary | ICD-10-CM

## 2019-11-17 DIAGNOSIS — C44311 Basal cell carcinoma of skin of nose: Secondary | ICD-10-CM | POA: Diagnosis not present

## 2019-11-17 DIAGNOSIS — D0462 Carcinoma in situ of skin of left upper limb, including shoulder: Secondary | ICD-10-CM

## 2019-11-17 DIAGNOSIS — D489 Neoplasm of uncertain behavior, unspecified: Secondary | ICD-10-CM

## 2019-11-17 DIAGNOSIS — D0460 Carcinoma in situ of skin of unspecified upper limb, including shoulder: Secondary | ICD-10-CM

## 2019-11-17 HISTORY — DX: Squamous cell carcinoma of skin, unspecified: C44.92

## 2019-11-17 NOTE — Progress Notes (Signed)
Follow-Up Visit   Subjective  Aaron Brown. is a 74 y.o. male who presents for the following: Basal Cell Carcinoma (bx proven 10/21/19 Left alar crease, here for [facility]), History of AK (Left hand dorsum, cryotherapy at last visit, still present), and History of SCC (Right hand dorsum and Right anterior neck).  Pt has h/o multiple BCCs/SCCs.   The following portions of the chart were reviewed this encounter and updated as appropriate:     Review of Systems:  No other skin or systemic complaints except as noted in HPI or Assessment and Plan.  Objective  Well appearing patient in no apparent distress; mood and affect are within normal limits.  A focused examination was performed including Left alar crease. Relevant physical exam findings are noted in the Assessment and Plan.  Objective  Left Alar Crease:  3 mm Pink biopsy site  Objective  Left Dorsal Hand: 65mm keratotic papule     Assessment & Plan  Basal cell carcinoma (BCC) of skin of nose Left Alar Crease  Destruction of lesion  Destruction method: electrodesiccation and curettage   Informed consent: discussed and consent obtained   Timeout:  patient name, date of birth, surgical site, and procedure verified Anesthesia: the lesion was anesthetized in a standard fashion   Anesthetic:  1% lidocaine w/ epinephrine 1-100,000 local infiltration Curettage performed in three different directions: Yes   Electrodesiccation performed over the curetted area: Yes   Lesion length (cm):  0.3 Lesion width (cm):  0.3 Margin per side (cm):  0.1 Final wound size (cm):  0.5 Hemostasis achieved with:  pressure, aluminum chloride and electrodesiccation Outcome: patient tolerated procedure well with no complications   Post-procedure details: wound care instructions given   Additional details:  Ointment and a bandaid applied  Neoplasm of uncertain behavior Left Dorsal Hand  Skin / nail biopsy Type of biopsy: tangential   Informed  consent: discussed and consent obtained   Timeout: patient name, date of birth, surgical site, and procedure verified   Patient was prepped and draped in usual sterile fashion: area was prepped with Alocohol. Anesthesia: the lesion was anesthetized in a standard fashion   Anesthetic:  1% lidocaine w/ epinephrine 1-100,000 buffered w/ 8.4% NaHCO3 Instrument used: flexible razor blade   Hemostasis achieved with: pressure, aluminum chloride and electrodesiccation   Outcome: patient tolerated procedure well   Post-procedure details: wound care instructions given   Post-procedure details comment:  Ointment and small bandage applied  Destruction of lesion  Destruction method: electrodesiccation and curettage   Informed consent: discussed and consent obtained   Timeout:  patient name, date of birth, surgical site, and procedure verified Curettage performed in three different directions: Yes   Electrodesiccation performed over the curetted area: Yes   Lesion length (cm):  0.4 Lesion width (cm):  0.4 Margin per side (cm):  0.1 Final wound size (cm):  0.6 Hemostasis achieved with:  pressure, aluminum chloride and electrodesiccation Outcome: patient tolerated procedure well with no complications   Post-procedure details: wound care instructions given   Additional details:  Ointment and a bandaid applied  Specimen 1 - Surgical pathology Differential Diagnosis: hypertrophic ak vs scc Check Margins: No 38mm hypertropic Erythematous thin papules/macules with gritty scale.  Hypertrophic Ak vs SCC Shave biopsy today with ED&C   History of Basal Cell Carcinoma of the Skin Multiple - Recommend regular full body skin exams - Recommend daily broad spectrum sunscreen SPF 30+ to sun-exposed areas, reapply every 2 hours as needed.  -  Call if any new or changing lesions are noted between office visits  History of Squamous Cell Carcinoma of the Skin Multiple - Recommend regular full body skin exams -  Recommend daily broad spectrum sunscreen SPF 30+ to sun-exposed areas, reapply every 2 hours as needed.  - Call if any new or changing lesions are noted between office visits  Return for as scheduled.  Marene Lenz, CMA, am acting as scribe for Brendolyn Patty, MD .  Documentation: I have reviewed the above documentation for accuracy and completeness, and I agree with the above.  Brendolyn Patty MD

## 2019-11-17 NOTE — Patient Instructions (Signed)

## 2019-11-18 ENCOUNTER — Encounter: Payer: Self-pay | Admitting: Dermatology

## 2019-11-19 ENCOUNTER — Telehealth: Payer: Self-pay

## 2019-11-19 NOTE — Telephone Encounter (Signed)
-----   Message from Brendolyn Patty, MD sent at 11/18/2019  6:38 PM EDT ----- Skin , left dorsal hand SQUAMOUS CELL CARCINOMA IN SITU, HYPERTROPHIC, BASE INVOLVED  SCCIS- already treated with [facility]

## 2019-11-19 NOTE — Telephone Encounter (Signed)
Lft pt msg to call for bx results/sh °

## 2019-11-20 ENCOUNTER — Telehealth: Payer: Self-pay

## 2019-11-20 NOTE — Telephone Encounter (Signed)
Discussed biopsy results with pt  °

## 2020-01-24 ENCOUNTER — Other Ambulatory Visit: Payer: Self-pay | Admitting: Family Medicine

## 2020-01-26 ENCOUNTER — Encounter: Payer: Self-pay | Admitting: Family Medicine

## 2020-01-26 ENCOUNTER — Other Ambulatory Visit: Payer: Self-pay

## 2020-01-26 ENCOUNTER — Ambulatory Visit (INDEPENDENT_AMBULATORY_CARE_PROVIDER_SITE_OTHER): Payer: PPO | Admitting: Family Medicine

## 2020-01-26 DIAGNOSIS — G8929 Other chronic pain: Secondary | ICD-10-CM | POA: Diagnosis not present

## 2020-01-26 DIAGNOSIS — M5442 Lumbago with sciatica, left side: Secondary | ICD-10-CM

## 2020-01-26 MED ORDER — ETODOLAC 400 MG PO TABS: 400.0000 mg | ORAL_TABLET | Freq: Two times a day (BID) | ORAL | 1 refills | Status: DC | PRN

## 2020-01-26 MED ORDER — PREDNISONE 20 MG PO TABS
ORAL_TABLET | ORAL | 0 refills | Status: DC
Start: 2020-01-26 — End: 2020-03-19

## 2020-01-26 MED ORDER — LOSARTAN POTASSIUM 25 MG PO TABS: 25.0000 mg | ORAL_TABLET | Freq: Every day | ORAL | 1 refills | Status: DC

## 2020-01-26 NOTE — Progress Notes (Signed)
This visit was conducted in person.  BP (!) 122/64 (BP Location: Left Arm, Patient Position: Sitting, Cuff Size: Normal)   Pulse 85   Temp 97.7 F (36.5 C) (Temporal)   Wt 172 lb 6.4 oz (78.2 kg)   SpO2 96%   BMI 27.00 kg/m    CC: back pain Subjective:    Patient ID: Aaron Evangelist., male    DOB: 03-Sep-1945, 74 y.o.   MRN: [id number]  HPI: Aaron Hoard. is a 74 y.o. male presenting on 01/26/2020 for Back Pain   See prior note for details.  Lower back pain with L lumbar radiculopathy present since 02/2019, in h/o lumbar surgeries x2 remotely.   Acute flare 3 wks ago while moving soil in wheelbarrow. Treated with etodolac and methocarbamol but pain persisting. Describes L lower back pain at posterior buttock, associated with pressure shooting down left lateral leg to knee. Etodolac has helped him remain active.   He already regularly follows HEP.   Denies fevers/chills, numbness or weakness of leg. No bowel/bladder incontinence Known multilevel herniated discs by MRI 11/2014.     Relevant past medical, surgical, family and social history reviewed and updated as indicated. Interim medical history since our last visit reviewed. Allergies and medications reviewed and updated. Outpatient Medications Prior to Visit  Medication Sig Dispense Refill  . amLODipine (NORVASC) 5 MG tablet TAKE 1 TABLET BY MOUTH EVERY DAY 90 tablet 0  . beta carotene w/minerals (OCUVITE) tablet Take 1 tablet by mouth daily.    . cholecalciferol (VITAMIN D) 1000 UNITS tablet Take 1,000 Units by mouth daily.    . hydrochlorothiazide (HYDRODIURIL) 25 MG tablet TAKE 1 TABLET BY MOUTH EVERY DAY 90 tablet 1  . LUTEIN PO Take by mouth.    . Multiple Vitamins-Minerals (LUTEIN-ZEAXANTHIN PO) Take by mouth.    . mupirocin ointment (BACTROBAN) 2 % Place 1 application into the nose 2 (two) times daily. 30 g 1  . Omega-3 Fatty Acids (OMEGA 3 PO) Take by mouth.    . losartan (COZAAR) 25 MG tablet TAKE  1 TABLET BY MOUTH EVERY DAY 90 tablet 1   No facility-administered medications prior to visit.     Per HPI unless specifically indicated in ROS section below Review of Systems Objective:  BP (!) 122/64 (BP Location: Left Arm, Patient Position: Sitting, Cuff Size: Normal)   Pulse 85   Temp 97.7 F (36.5 C) (Temporal)   Wt 172 lb 6.4 oz (78.2 kg)   SpO2 96%   BMI 27.00 kg/m   Wt Readings from Last 3 Encounters:  01/26/20 172 lb 6.4 oz (78.2 kg)  03/31/19 166 lb (75.3 kg)  02/10/19 166 lb (75.3 kg)      Physical Exam Vitals and nursing note reviewed.  Constitutional:      Appearance: Normal appearance. He is not ill-appearing.  Musculoskeletal:        General: No swelling or tenderness. Normal range of motion.     Right lower leg: No edema.     Left lower leg: No edema.     Comments:  No pain midline spine No paraspinous mm tenderness Neg SLR bilaterally. No pain with int/ext rotation at hip. No pain at SIJ, GTB or sciatic notch bilaterally.   Skin:    General: Skin is warm and dry.     Capillary Refill: Capillary refill takes less than 2 seconds.     Findings: No rash.  Neurological:     General: No focal  deficit present.     Mental Status: He is alert.     Deep Tendon Reflexes:     Reflex Scores:      Patellar reflexes are 2+ on the right side and 2+ on the left side.    Comments:  5/5 strength BLE Sensation intact  Psychiatric:        Mood and Affect: Mood normal.        Behavior: Behavior normal.       LUMBAR FILM IMPRESSION: 1. Stable postsurgical changes from prior laminectomy at L4 and L5. No acute osseous abnormality. 2. Multilevel degenerative changes of the lumbar spine. No severe canal stenosis. Electronically Signed By: Lovena Le M.D. On: 04/01/2019 01:08 Assessment & Plan:  This visit occurred during the SARS-CoV-2 public health emergency.  Safety protocols were in place, including screening questions prior to the visit, additional usage  of staff PPE, and extensive cleaning of exam room while observing appropriate contact time as indicated for disinfecting solutions.   Problem List Items Addressed This Visit    Lower back pain    Anticipate acute flare of lower back pain with lumbar radicular pain without red flags, in known HNP s/p lumbar surgeries x2 (1988, 2000). Will refill etodolac and flexeril for PRN use, as well as Rx prednisone taper - previously effective. He would like to try medication management first and if not improving agrees to formal PT. He already regularly follows HEP. No red flags today.       Relevant Medications   etodolac (LODINE) 400 MG tablet   predniSONE (DELTASONE) 20 MG tablet       Meds ordered this encounter  Medications  . losartan (COZAAR) 25 MG tablet    Sig: Take 1 tablet (25 mg total) by mouth daily.    Dispense:  90 tablet    Refill:  1  . etodolac (LODINE) 400 MG tablet    Sig: Take 1 tablet (400 mg total) by mouth 2 (two) times daily as needed.    Dispense:  60 tablet    Refill:  1  . predniSONE (DELTASONE) 20 MG tablet    Sig: Take two tablets daily for 3 days followed by one tablet daily for 4 days    Dispense:  10 tablet    Refill:  0   No orders of the defined types were placed in this encounter.   Patient Instructions  Return for physical in September.  I do think this is a flare of lower back issues.  Treat with prednisone taper. I've refilled etodolac and flexeril to use as needed.  Let me know if not improving for formal PT.   Follow up plan: Return if symptoms worsen or fail to improve, for annual exam, prior fasting for blood work, medicare wellness visit.  Ria Bush, MD

## 2020-01-26 NOTE — Assessment & Plan Note (Addendum)
Anticipate acute flare of lower back pain with lumbar radicular pain without red flags, in known HNP s/p lumbar surgeries x2 (1988, 2000). Will refill etodolac and flexeril for PRN use, as well as Rx prednisone taper - previously effective. He would like to try medication management first and if not improving agrees to formal PT. He already regularly follows HEP. No red flags today.

## 2020-01-26 NOTE — Patient Instructions (Addendum)
Return for physical in September.  I do think this is a flare of lower back issues.  Treat with prednisone taper. I've refilled etodolac and flexeril to use as needed.  Let me know if not improving for formal PT.

## 2020-01-26 NOTE — Telephone Encounter (Signed)
Rx was last refilled 07/29/19 for #90 with 1 refill.  Patient was last seen 03/31/19. OK to refill?

## 2020-01-27 ENCOUNTER — Other Ambulatory Visit: Payer: Self-pay

## 2020-03-15 ENCOUNTER — Ambulatory Visit: Payer: PPO

## 2020-03-16 ENCOUNTER — Other Ambulatory Visit: Payer: Self-pay

## 2020-03-16 ENCOUNTER — Other Ambulatory Visit (INDEPENDENT_AMBULATORY_CARE_PROVIDER_SITE_OTHER): Payer: PPO

## 2020-03-16 ENCOUNTER — Other Ambulatory Visit: Payer: Self-pay | Admitting: Family Medicine

## 2020-03-16 ENCOUNTER — Ambulatory Visit (INDEPENDENT_AMBULATORY_CARE_PROVIDER_SITE_OTHER): Payer: PPO

## 2020-03-16 DIAGNOSIS — R7303 Prediabetes: Secondary | ICD-10-CM

## 2020-03-16 DIAGNOSIS — I10 Essential (primary) hypertension: Secondary | ICD-10-CM | POA: Diagnosis not present

## 2020-03-16 DIAGNOSIS — I6789 Other cerebrovascular disease: Secondary | ICD-10-CM

## 2020-03-16 DIAGNOSIS — Z0000 Encounter for general adult medical examination without abnormal findings: Secondary | ICD-10-CM

## 2020-03-16 DIAGNOSIS — Z125 Encounter for screening for malignant neoplasm of prostate: Secondary | ICD-10-CM

## 2020-03-16 LAB — HEMOGLOBIN A1C: Hgb A1c MFr Bld: 6.1 % (ref 4.6–6.5)

## 2020-03-16 LAB — COMPREHENSIVE METABOLIC PANEL
ALT: 22 U/L (ref 0–53)
AST: 32 U/L (ref 0–37)
Albumin: 4.3 g/dL (ref 3.5–5.2)
Alkaline Phosphatase: 46 U/L (ref 39–117)
BUN: 16 mg/dL (ref 6–23)
CO2: 31 mEq/L (ref 19–32)
Calcium: 9.2 mg/dL (ref 8.4–10.5)
Chloride: 99 mEq/L (ref 96–112)
Creatinine, Ser: 1.13 mg/dL (ref 0.40–1.50)
GFR: 63.68 mL/min (ref >60.00)
Glucose, Bld: 100 mg/dL — ABNORMAL HIGH (ref 70–99)
Potassium: 4.3 mEq/L (ref 3.5–5.1)
Sodium: 137 mEq/L (ref 135–145)
Total Bilirubin: 0.8 mg/dL (ref 0.2–1.2)
Total Protein: 7.3 g/dL (ref 6.0–8.3)

## 2020-03-16 LAB — MICROALBUMIN / CREATININE URINE RATIO
Creatinine,U: 161.8 mg/dL
Microalb Creat Ratio: 0.4 mg/g (ref 0.0–30.0)
Microalb, Ur: <0.7 mg/dL (ref 0.0–1.9)

## 2020-03-16 LAB — LIPID PANEL
Cholesterol: 125 mg/dL (ref 0–200)
HDL: 51.9 mg/dL (ref >39.00)
LDL Cholesterol: 61 mg/dL (ref 0–99)
NonHDL: 73.04
Total CHOL/HDL Ratio: 2
Triglycerides: 61 mg/dL (ref 0.0–149.0)
VLDL: 12.2 mg/dL (ref 0.0–40.0)

## 2020-03-16 LAB — PSA: PSA: 4.62 ng/mL — ABNORMAL HIGH (ref 0.10–4.00)

## 2020-03-16 NOTE — Progress Notes (Signed)
PCP notes:  Health Maintenance: Flu- due Cologuard due   Abnormal Screenings: none   Patient concerns: Discuss reducing blood pressure medication   Nurse concerns: none   Next PCP appt.: 03/19/2020 @ 7:30 am

## 2020-03-16 NOTE — Progress Notes (Signed)
Subjective:   Aaron Brown. is a 74 y.o. male who presents for Medicare Annual/Subsequent preventive examination.  Review of Systems: N/A      I connected with the patient today by telephone and verified that I am speaking with the correct person using two identifiers. Location patient: home Location nurse: work Persons participating in the telephone visit: patient, nurse.   I discussed the limitations, risks, security and privacy concerns of performing an evaluation and management service by telephone and the availability of in person appointments. I also discussed with the patient that there may be a patient responsible charge related to this service. The patient expressed understanding and verbally consented to this telephonic visit.        Cardiac Risk Factors include: advanced age (>30men, >69 women);hypertension;male gender     Objective:    Today's Vitals   There is no height or weight on file to calculate BMI.  Advanced Directives 03/16/2020 06/21/2018 01/07/2018 12/16/2015  Does Patient Have a Medical Advance Directive? Yes No No;Yes Yes  Type of Paramedic of [place];Living will - [place];Living will [place]  Does patient want to make changes to medical advance directive? - - - No - Patient declined  Copy of [place] in Chart? Yes - validated most recent copy scanned in chart (See row information) - Yes No - copy requested  Would patient like information on creating a medical advance directive? - No - Patient declined No - Patient declined -    Current Medications (verified) Outpatient Encounter Medications as of 03/16/2020  Medication Sig  . amLODipine (NORVASC) 5 MG tablet TAKE 1 TABLET BY MOUTH EVERY DAY  . beta carotene w/minerals (OCUVITE) tablet Take 1 tablet by mouth daily.  . cholecalciferol (VITAMIN D) 1000 UNITS tablet Take 1,000 Units by mouth daily.  Marland Kitchen etodolac  (LODINE) 400 MG tablet Take 1 tablet (400 mg total) by mouth 2 (two) times daily as needed.  . hydrochlorothiazide (HYDRODIURIL) 25 MG tablet TAKE 1 TABLET BY MOUTH EVERY DAY  . losartan (COZAAR) 25 MG tablet TAKE 1 TABLET BY MOUTH EVERY DAY  . LUTEIN PO Take by mouth.  . Multiple Vitamins-Minerals (LUTEIN-ZEAXANTHIN PO) Take by mouth.  . mupirocin ointment (BACTROBAN) 2 % Place 1 application into the nose 2 (two) times daily.  . Omega-3 Fatty Acids (OMEGA 3 PO) Take by mouth.  . predniSONE (DELTASONE) 20 MG tablet Take two tablets daily for 3 days followed by one tablet daily for 4 days (Patient not taking: Reported on 03/16/2020)   No facility-administered encounter medications on file as of 03/16/2020.    Allergies (verified) Lisinopril   History: Past Medical History:  Diagnosis Date  . Actinic keratosis   . Asymmetrical left sensorineural hearing loss 12/2013   s/p MRI pending hearing aides [name])  . Basal cell carcinoma 10/21/2019   L ala crease  . Basal cell carcinoma 11/11/2018   R chin/excision  . Basal cell carcinoma 03/12/2018   L mid forearm  . Basal cell carcinoma 05/30/2016   R inf clavicle, L lateral canthus  . Basal cell carcinoma 01/06/2014   L elbow  . Blood in stool    in the past; ? hemorrhoids  . Cerebral microvascular disease 12/2013   by MRI, referred to neuro by ENT  . Colon polyp 05/2005   ?hyperplastic  . History of chicken pox   . Hypertension   . Increased pressure in the  eye    Dr. Gloriann Loan  . Lower back pain    bulging disc with spinal stenosis L4/5 s/p surgery x2  . Prediabetes    A1c 6.0 (2012)  . Seasonal allergies   . Squamous acanthoma of face   . Squamous cell cancer of skin of hand 10/22/2018   R hand dorsum below first webspace  . Squamous cell carcinoma of neck 06/26/2013   Right anterior neck/excision   Past Surgical History:  Procedure Laterality Date  . BUNIONECTOMY  2003   Dr. Barkley Bruns  . COLONOSCOPY  05/2005   1 polyp, int  hem   . DOBUTAMINE STRESS ECHO  2013   normal per prior PCP records  . hemi-laminectomy  2000   Dr. Annette Stable, Dr. Hal Neer L4/L5  . LAMINECTOMY  1988   Dr. Pearlie Oyster; L5  . SQUAMOUS CELL CARCINOMA EXCISION  2014   face and hand   Family History  Problem Relation Age of Onset  . Hypertension Mother   . Arthritis Father   . Hypertension Father   . Alcohol abuse Maternal Grandfather   . Cancer Maternal Aunt        colon  . Cancer Cousin        colon  . Cancer Cousin        breast   Social History   Socioeconomic History  . Marital status: Married    Spouse name: Not on file  . Number of children: Not on file  . Years of education: Not on file  . Highest education level: Not on file  Occupational History  . Not on file  Tobacco Use  . Smoking status: Never Smoker  . Smokeless tobacco: Never Used  Vaping Use  . Vaping Use: Never used  Substance and Sexual Activity  . Alcohol use: Yes    Alcohol/week: 0.0 standard drinks    Comment: Occasional-wine  . Drug use: No  . Sexual activity: Yes  Other Topics Concern  . Not on file  Social History Narrative   Lives with wife   grown children   Occupation: was Government social research officer, now owns landscaping business   Edu: Sports administrator   Activity: stays active at work   Diet: good water, fruits/vegetables some   Social Determinants of Radio broadcast assistant Strain: [place]   . Difficulty of Paying Living Expenses: Not hard at all  Food Insecurity: No Food Insecurity  . Worried About Charity fundraiser in the Last Year: Never true  . Ran Out of Food in the Last Year: Never true  Transportation Needs: No Transportation Needs  . Lack of Transportation (Medical): No  . Lack of Transportation (Non-Medical): No  Physical Activity: Sufficiently Active  . Days of Exercise per Week: 7 days  . Minutes of Exercise per Session: 60 min  Stress: No Stress Concern Present  . Feeling of Stress : Not at all  Social Connections:   .  Frequency of Communication with Friends and Family: Not on file  . Frequency of Social Gatherings with Friends and Family: Not on file  . Attends Religious Services: Not on file  . Active Member of Clubs or Organizations: Not on file  . Attends Archivist Meetings: Not on file  . Marital Status: Not on file    Tobacco Counseling Counseling given: Not Answered   Clinical Intake:  Pre-visit preparation completed: Yes  Pain : No/denies pain     Nutritional Risks: None Diabetes: No  How often  do you need to have someone help you when you read instructions, pamphlets, or other written materials from your doctor or pharmacy?: 1 - Never What is the last grade level you completed in school?: bachelors  Diabetic: No Nutrition Risk Assessment:  Has the patient had any N/V/D within the last 2 months?  No  Does the patient have any non-healing wounds?  No  Has the patient had any unintentional weight loss or weight gain?  No   Diabetes:  Is the patient diabetic?  No  If diabetic, was a CBG obtained today?  N/A Did the patient bring in their glucometer from home?  N/A How often do you monitor your CBG's? N/A.   Financial Strains and Diabetes Management:  Are you having any financial strains with the device, your supplies or your medication? N/A.  Does the patient want to be seen by Chronic Care Management for management of their diabetes?  N/A Would the patient like to be referred to a Nutritionist or for Diabetic Management?  N/A     Interpreter Needed?: No  Information entered by :: CJohnson, LPN   Activities of Daily Living In your present state of health, do you have any difficulty performing the following activities: 03/16/2020  Hearing? Y  Comment wears hearing aids  Vision? N  Difficulty concentrating or making decisions? N  Walking or climbing stairs? N  Dressing or bathing? N  Doing errands, shopping? N  Preparing Food and eating ? N  Using the  Toilet? N  In the past six months, have you accidently leaked urine? N  Do you have problems with loss of bowel control? N  Managing your Medications? N  Managing your Finances? N  Housekeeping or managing your Housekeeping? N  Some recent data might be hidden    Patient Care Team: Ria Bush, MD as PCP - General (Family Medicine) Lorelee Cover., MD as Referring Physician (Ophthalmology) Eugenie Birks Paschal Dopp., MD as Referring Physician (Dentistry) Brendolyn Patty, MD as Referring Physician (Dermatology) Garrel Ridgel, DPM as Consulting Physician (Podiatry)  Indicate any recent Medical Services you may have received from other than [facility] providers in the past year (date may be approximate).     Assessment:   This is a routine wellness examination for [employer].  Hearing/Vision screen  Hearing Screening   125Hz  250Hz  500Hz  1000Hz  2000Hz  3000Hz  4000Hz  6000Hz  8000Hz   Right ear:           Left ear:           Vision Screening Comments: Patient gets annual eye exams   Dietary issues and exercise activities discussed: Current Exercise Habits: Home exercise routine, Type of exercise: walking, Time (Minutes): 60, Frequency (Times/Week): 7, Weekly Exercise (Minutes/Week): 420, Intensity: Moderate, Exercise limited by: None identified  Goals    . Increase physical activity     Starting 12/16/2015, I will continue to walk most days 1-2 miles, do landscape work, and do back exercises at night for 1 hr.     . Patient Stated     Starting 01/07/2018, I will continue to take medications as prescribed.     . Patient Stated     03/16/2020, I will continue to walk 2 miles everyday.       Depression Screen PHQ 2/9 Scores 03/16/2020 01/27/2019 01/07/2018 01/02/2017 12/16/2015 12/14/2014 12/03/2013  PHQ - 2 Score 0 0 0 0 0 0 0  PHQ- 9 Score 0 - 0 - - - -    Fall Risk Fall  Risk  03/16/2020 05/28/2019 01/27/2019 01/07/2018 01/02/2017  Falls in the past year? 0 0 0 No No  Comment - Emmi Telephone Survey:  data to providers prior to load - - -  Number falls in past yr: 0 - - - -  Injury with Fall? 0 - - - -  Risk for fall due to : Medication side effect - - - -  Follow up Falls evaluation completed;Falls prevention discussed - - - -    Any stairs in or around the home? Yes  If so, are there any without handrails? No  Home free of loose throw rugs in walkways, pet beds, electrical cords, etc? Yes  Adequate lighting in your home to reduce risk of falls? Yes   ASSISTIVE DEVICES UTILIZED TO PREVENT FALLS:  Life alert? No  Use of a cane, walker or w/c? No  Grab bars in the bathroom? No  Shower chair or bench in shower? No  Elevated toilet seat or a handicapped toilet? No   TIMED UP AND GO:  Was the test performed? N/A, telephonic visit.    Cognitive Function: MMSE - Mini Mental State Exam 03/16/2020 01/07/2018 12/16/2015  Orientation to time 5 5 5   Orientation to Place 5 5 5   Registration 3 3 3   Attention/ Calculation 5 0 0  Recall 3 3 3   Language- name 2 objects - 0 0  Language- repeat 1 1 1   Language- follow 3 step command - 3 3  Language- read & follow direction - 0 0  Write a sentence - 0 0  Copy design - 0 0  Total score - 20 20  Mini Cog  Mini-Cog screen was completed. Maximum score is 22. A value of 0 denotes this part of the MMSE was not completed or the patient failed this part of the Mini-Cog screening.       Immunizations Immunization History  Administered Date(s) Administered  . Fluad Quad(high Dose 65+) 03/31/2019  . Influenza,inj,Quad PF,6+ Mos 08/02/2016, 08/26/2018  . PFIZER SARS-COV-2 Vaccination 10/02/2019, 10/23/2019  . Pneumococcal Conjugate-13 12/03/2013  . Pneumococcal Polysaccharide-23 11/11/2012  . Tdap 12/27/2011  . Zoster 07/04/2011    TDAP status: Up to date Flu Vaccine status: due, will get at upcoming visit  Pneumococcal vaccine status: Up to date Covid-19 vaccine status: Completed vaccines  Qualifies for Shingles Vaccine? Yes    Zostavax completed Yes   Shingrix Completed?: No.    Education has been provided regarding the importance of this vaccine. Patient has been advised to call insurance company to determine out of pocket expense if they have not yet received this vaccine. Advised may also receive vaccine at local pharmacy or Health Dept. Verbalized acceptance and understanding.  Screening Tests Health Maintenance  Topic Date Due  . Fecal DNA (Cologuard)  12/19/2019  . INFLUENZA VACCINE  02/01/2020  . TETANUS/TDAP  12/26/2021  . COVID-19 Vaccine  Completed  . Hepatitis C Screening  Completed  . PNA vac Low Risk Adult  Completed    Health Maintenance  Health Maintenance Due  Topic Date Due  . Fecal DNA (Cologuard)  12/19/2019  . INFLUENZA VACCINE  02/01/2020    Colorectal cancer screening: Cologuard due, will discuss with provider at upcoming physical   Lung Cancer Screening: (Low Dose CT Chest recommended if Age 7-80 years, 30 pack-year currently smoking OR have quit w/in 15years.) does not qualify.    Additional Screening:  Hepatitis C Screening: does qualify; Completed 12/16/2015  Vision Screening: Recommended annual ophthalmology exams  for early detection of glaucoma and other disorders of the eye. Is the patient up to date with their annual eye exam?  Yes  Who is the provider or what is the name of the office in which the patient attends annual eye exams? Dr. Lorie Apley If pt is not established with a provider, would they like to be referred to a provider to establish care? No .   Dental Screening: Recommended annual dental exams for proper oral hygiene  Community Resource Referral / Chronic Care Management: CRR required this visit?  No   CCM required this visit?  No      Plan:     I have personally reviewed and noted the following in the patient's chart:   . Medical and social history . Use of alcohol, tobacco or illicit drugs  . Current medications and  supplements . Functional ability and status . Nutritional status . Physical activity . Advanced directives . List of other physicians . Hospitalizations, surgeries, and ER visits in previous 12 months . Vitals . Screenings to include cognitive, depression, and falls . Referrals and appointments  In addition, I have reviewed and discussed with patient certain preventive protocols, quality metrics, and best practice recommendations. A written personalized care plan for preventive services as well as general preventive health recommendations were provided to patient.   Due to this being a telephonic visit, the after visit summary with patients personalized plan was offered to patient via mail or my-chart.  Patient preferred to pick up at office at next visit.   Andrez Grime, LPN   [id number]

## 2020-03-16 NOTE — Addendum Note (Signed)
Addended by: Cloyd Stagers on: 03/16/2020 08:19 AM   Modules accepted: Orders

## 2020-03-16 NOTE — Patient Instructions (Signed)
Aaron Brown , Thank you for taking time to come for your Medicare Wellness Visit. I appreciate your ongoing commitment to your health goals. Please review the following plan we discussed and let me know if I can assist you in the future.   Screening recommendations/referrals: Colonoscopy: Cologuard due, will talk with provider about this Recommended yearly ophthalmology/optometry visit for glaucoma screening and checkup Recommended yearly dental visit for hygiene and checkup  Vaccinations: Influenza vaccine: due, will get at upcoming office visit  Pneumococcal vaccine: Completed series Tdap vaccine: Up to date, completed 12/27/2011, due 12/2021 Shingles vaccine: due, check with your insurance regarding coverage    Covid-19: Completed series  Advanced directives: copy in chart  Conditions/risks identified: hypertension  Next appointment: Follow up in one year for your annual wellness visit.   Preventive Care 40 Years and Older, Male Preventive care refers to lifestyle choices and visits with your health care provider that can promote health and wellness. What does preventive care include?  A yearly physical exam. This is also called an annual well check.  Dental exams once or twice a year.  Routine eye exams. Ask your health care provider how often you should have your eyes checked.  Personal lifestyle choices, including:  Daily care of your teeth and gums.  Regular physical activity.  Eating a healthy diet.  Avoiding tobacco and drug use.  Limiting alcohol use.  Practicing safe sex.  Taking low doses of aspirin every day.  Taking vitamin and mineral supplements as recommended by your health care provider. What happens during an annual well check? The services and screenings done by your health care provider during your annual well check will depend on your age, overall health, lifestyle risk factors, and family history of disease. Counseling  Your health care provider  may ask you questions about your:  Alcohol use.  Tobacco use.  Drug use.  Emotional well-being.  Home and relationship well-being.  Sexual activity.  Eating habits.  History of falls.  Memory and ability to understand (cognition).  Work and work Statistician. Screening  You may have the following tests or measurements:  Height, weight, and BMI.  Blood pressure.  Lipid and cholesterol levels. These may be checked every 5 years, or more frequently if you are over 50 years old.  Skin check.  Lung cancer screening. You may have this screening every year starting at age 82 if you have a 30-pack-year history of smoking and currently smoke or have quit within the past 15 years.  Fecal occult blood test (FOBT) of the stool. You may have this test every year starting at age 7.  Flexible sigmoidoscopy or colonoscopy. You may have a sigmoidoscopy every 5 years or a colonoscopy every 10 years starting at age 11.  Prostate cancer screening. Recommendations will vary depending on your family history and other risks.  Hepatitis C blood test.  Hepatitis B blood test.  Sexually transmitted disease (STD) testing.  Diabetes screening. This is done by checking your blood sugar (glucose) after you have not eaten for a while (fasting). You may have this done every 1-3 years.  Abdominal aortic aneurysm (AAA) screening. You may need this if you are a current or former smoker.  Osteoporosis. You may be screened starting at age 5 if you are at high risk. Talk with your health care provider about your test results, treatment options, and if necessary, the need for more tests. Vaccines  Your health care provider may recommend certain vaccines, such as:  Influenza vaccine. This is recommended every year.  Tetanus, diphtheria, and acellular pertussis (Tdap, Td) vaccine. You may need a Td booster every 10 years.  Zoster vaccine. You may need this after age 55.  Pneumococcal 13-valent  conjugate (PCV13) vaccine. One dose is recommended after age 73.  Pneumococcal polysaccharide (PPSV23) vaccine. One dose is recommended after age 11. Talk to your health care provider about which screenings and vaccines you need and how often you need them. This information is not intended to replace advice given to you by your health care provider. Make sure you discuss any questions you have with your health care provider. Document Released: 07/16/2015 Document Revised: 03/08/2016 Document Reviewed: 04/20/2015 Elsevier Interactive Patient Education  2017 [place] Prevention in the Home Falls can cause injuries. They can happen to people of all ages. There are many things you can do to make your home safe and to help prevent falls. What can I do on the outside of my home?  Regularly fix the edges of walkways and driveways and fix any cracks.  Remove anything that might make you trip as you walk through a door, such as a raised step or threshold.  Trim any bushes or trees on the path to your home.  Use bright outdoor lighting.  Clear any walking paths of anything that might make someone trip, such as rocks or tools.  Regularly check to see if handrails are loose or broken. Make sure that both sides of any steps have handrails.  Any raised decks and porches should have guardrails on the edges.  Have any leaves, snow, or ice cleared regularly.  Use sand or salt on walking paths during winter.  Clean up any spills in your garage right away. This includes oil or grease spills. What can I do in the bathroom?  Use night lights.  Install grab bars by the toilet and in the tub and shower. Do not use towel bars as grab bars.  Use non-skid mats or decals in the tub or shower.  If you need to sit down in the shower, use a plastic, non-slip stool.  Keep the floor dry. Clean up any water that spills on the floor as soon as it happens.  Remove soap buildup in the tub or  shower regularly.  Attach bath mats securely with double-sided non-slip rug tape.  Do not have throw rugs and other things on the floor that can make you trip. What can I do in the bedroom?  Use night lights.  Make sure that you have a light by your bed that is easy to reach.  Do not use any sheets or blankets that are too big for your bed. They should not hang down onto the floor.  Have a firm chair that has side arms. You can use this for support while you get dressed.  Do not have throw rugs and other things on the floor that can make you trip. What can I do in the kitchen?  Clean up any spills right away.  Avoid walking on wet floors.  Keep items that you use a lot in easy-to-reach places.  If you need to reach something above you, use a strong step stool that has a grab bar.  Keep electrical cords out of the way.  Do not use floor polish or wax that makes floors slippery. If you must use wax, use non-skid floor wax.  Do not have throw rugs and other things on the floor that  can make you trip. What can I do with my stairs?  Do not leave any items on the stairs.  Make sure that there are handrails on both sides of the stairs and use them. Fix handrails that are broken or loose. Make sure that handrails are as long as the stairways.  Check any carpeting to make sure that it is firmly attached to the stairs. Fix any carpet that is loose or worn.  Avoid having throw rugs at the top or bottom of the stairs. If you do have throw rugs, attach them to the floor with carpet tape.  Make sure that you have a light switch at the top of the stairs and the bottom of the stairs. If you do not have them, ask someone to add them for you. What else can I do to help prevent falls?  Wear shoes that:  Do not have high heels.  Have rubber bottoms.  Are comfortable and fit you well.  Are closed at the toe. Do not wear sandals.  If you use a stepladder:  Make sure that it is fully  opened. Do not climb a closed stepladder.  Make sure that both sides of the stepladder are locked into place.  Ask someone to hold it for you, if possible.  Clearly mark and make sure that you can see:  Any grab bars or handrails.  First and last steps.  Where the edge of each step is.  Use tools that help you move around (mobility aids) if they are needed. These include:  Canes.  Walkers.  Scooters.  Crutches.  Turn on the lights when you go into a dark area. Replace any light bulbs as soon as they burn out.  Set up your furniture so you have a clear path. Avoid moving your furniture around.  If any of your floors are uneven, fix them.  If there are any pets around you, be aware of where they are.  Review your medicines with your doctor. Some medicines can make you feel dizzy. This can increase your chance of falling. Ask your doctor what other things that you can do to help prevent falls. This information is not intended to replace advice given to you by your health care provider. Make sure you discuss any questions you have with your health care provider. Document Released: 04/15/2009 Document Revised: 11/25/2015 Document Reviewed: 07/24/2014 Elsevier Interactive Patient Education  2017 [employer].

## 2020-03-19 ENCOUNTER — Other Ambulatory Visit: Payer: Self-pay

## 2020-03-19 ENCOUNTER — Ambulatory Visit (INDEPENDENT_AMBULATORY_CARE_PROVIDER_SITE_OTHER): Payer: PPO | Admitting: Family Medicine

## 2020-03-19 ENCOUNTER — Encounter: Payer: Self-pay | Admitting: Family Medicine

## 2020-03-19 VITALS — BP 122/74 | HR 76 | Temp 97.8°F | Ht 67.0 in | Wt 166.3 lb

## 2020-03-19 DIAGNOSIS — Z7189 Other specified counseling: Secondary | ICD-10-CM | POA: Diagnosis not present

## 2020-03-19 DIAGNOSIS — N400 Enlarged prostate without lower urinary tract symptoms: Secondary | ICD-10-CM | POA: Insufficient documentation

## 2020-03-19 DIAGNOSIS — Z23 Encounter for immunization: Secondary | ICD-10-CM

## 2020-03-19 DIAGNOSIS — I10 Essential (primary) hypertension: Secondary | ICD-10-CM

## 2020-03-19 DIAGNOSIS — C61 Malignant neoplasm of prostate: Secondary | ICD-10-CM | POA: Insufficient documentation

## 2020-03-19 DIAGNOSIS — N401 Enlarged prostate with lower urinary tract symptoms: Secondary | ICD-10-CM

## 2020-03-19 DIAGNOSIS — R972 Elevated prostate specific antigen [PSA]: Secondary | ICD-10-CM

## 2020-03-19 DIAGNOSIS — R7303 Prediabetes: Secondary | ICD-10-CM

## 2020-03-19 DIAGNOSIS — Z0001 Encounter for general adult medical examination with abnormal findings: Secondary | ICD-10-CM | POA: Diagnosis not present

## 2020-03-19 DIAGNOSIS — R351 Nocturia: Secondary | ICD-10-CM | POA: Diagnosis not present

## 2020-03-19 LAB — POC URINALSYSI DIPSTICK (AUTOMATED)
Bilirubin, UA: NEGATIVE
Blood, UA: NEGATIVE
Glucose, UA: NEGATIVE
Ketones, UA: NEGATIVE
Leukocytes, UA: NEGATIVE
Nitrite, UA: NEGATIVE
Protein, UA: NEGATIVE
Spec Grav, UA: 1.015 (ref 1.010–1.025)
Urobilinogen, UA: 0.2 E.U./dL
pH, UA: 6.5 (ref 5.0–8.0)

## 2020-03-19 MED ORDER — LOSARTAN POTASSIUM 25 MG PO TABS
25.0000 mg | ORAL_TABLET | Freq: Every day | ORAL | 3 refills | Status: DC
Start: 2020-03-19 — End: 2021-04-21

## 2020-03-19 MED ORDER — AMLODIPINE BESYLATE 5 MG PO TABS
5.0000 mg | ORAL_TABLET | Freq: Every day | ORAL | 3 refills | Status: DC
Start: 2020-03-19 — End: 2020-03-23

## 2020-03-19 MED ORDER — HYDROCHLOROTHIAZIDE 25 MG PO TABS
25.0000 mg | ORAL_TABLET | Freq: Every day | ORAL | 3 refills | Status: DC
Start: 2020-03-19 — End: 2021-04-18

## 2020-03-19 NOTE — Assessment & Plan Note (Signed)
Discussed healthy diet choices - encouraged limiting simple carbs.

## 2020-03-19 NOTE — Patient Instructions (Addendum)
Flu shot today Urinalysis today. Return in 6 weeks for lab visit only to recheck prostate level.  We will sign you up for cologuard.  If interested, check with pharmacy about new 2 shot shingles series (shingrix).  You are doing well today. Continue current medicines.  Continue mediterranean diet.  Return as needed or in 1 year for next physical.   Health Maintenance After Age 72 After age 50, you are at a higher risk for certain long-term diseases and infections as well as injuries from falls. Falls are a major cause of broken bones and head injuries in people who are older than age 45. Getting regular preventive care can help to keep you healthy and well. Preventive care includes getting regular testing and making lifestyle changes as recommended by your health care provider. Talk with your health care provider about:  Which screenings and tests you should have. A screening is a test that checks for a disease when you have no symptoms.  A diet and exercise plan that is right for you. What should I know about screenings and tests to prevent falls? Screening and testing are the best ways to find a health problem early. Early diagnosis and treatment give you the best chance of managing medical conditions that are common after age 33. Certain conditions and lifestyle choices may make you more likely to have a fall. Your health care provider may recommend:  Regular vision checks. Poor vision and conditions such as cataracts can make you more likely to have a fall. If you wear glasses, make sure to get your prescription updated if your vision changes.  Medicine review. Work with your health care provider to regularly review all of the medicines you are taking, including over-the-counter medicines. Ask your health care provider about any side effects that may make you more likely to have a fall. Tell your health care provider if any medicines that you take make you feel dizzy or sleepy.  Osteoporosis  screening. Osteoporosis is a condition that causes the bones to get weaker. This can make the bones weak and cause them to break more easily.  Blood pressure screening. Blood pressure changes and medicines to control blood pressure can make you feel dizzy.  Strength and balance checks. Your health care provider may recommend certain tests to check your strength and balance while standing, walking, or changing positions.  Foot health exam. Foot pain and numbness, as well as not wearing proper footwear, can make you more likely to have a fall.  Depression screening. You may be more likely to have a fall if you have a fear of falling, feel emotionally low, or feel unable to do activities that you used to do.  Alcohol use screening. Using too much alcohol can affect your balance and may make you more likely to have a fall. What actions can I take to lower my risk of falls? General instructions  Talk with your health care provider about your risks for falling. Tell your health care provider if: ? You fall. Be sure to tell your health care provider about all falls, even ones that seem minor. ? You feel dizzy, sleepy, or off-balance.  Take over-the-counter and prescription medicines only as told by your health care provider. These include any supplements.  Eat a healthy diet and maintain a healthy weight. A healthy diet includes low-fat dairy products, low-fat (lean) meats, and fiber from whole grains, beans, and lots of fruits and vegetables. Home safety  Remove any tripping hazards, such  as rugs, cords, and clutter.  Install safety equipment such as grab bars in bathrooms and safety rails on stairs.  Keep rooms and walkways well-lit. Activity   Follow a regular exercise program to stay fit. This will help you maintain your balance. Ask your health care provider what types of exercise are appropriate for you.  If you need a cane or walker, use it as recommended by your health care  provider.  Wear supportive shoes that have nonskid soles. Lifestyle  Do not drink alcohol if your health care provider tells you not to drink.  If you drink alcohol, limit how much you have: ? 0-1 drink a day for women. ? 0-2 drinks a day for men.  Be aware of how much alcohol is in your drink. In the [place] one drink equals one typical bottle of beer (12 oz), one-half glass of wine (5 oz), or one shot of hard liquor (1 oz).  Do not use any products that contain nicotine or tobacco, such as cigarettes and e-cigarettes. If you need help quitting, ask your health care provider. Summary  Having a healthy lifestyle and getting preventive care can help to protect your health and wellness after age 31.  Screening and testing are the best way to find a health problem early and help you avoid having a fall. Early diagnosis and treatment give you the best chance for managing medical conditions that are more common for people who are older than age 71.  Falls are a major cause of broken bones and head injuries in people who are older than age 23. Take precautions to prevent a fall at home.  Work with your health care provider to learn what changes you can make to improve your health and wellness and to prevent falls. This information is not intended to replace advice given to you by your health care provider. Make sure you discuss any questions you have with your health care provider. Document Revised: 10/10/2018 Document Reviewed: 05/02/2017 Elsevier Patient Education  2020 [employer].

## 2020-03-19 NOTE — Assessment & Plan Note (Signed)
Noted on exam today.

## 2020-03-19 NOTE — Assessment & Plan Note (Signed)
Preventative protocols reviewed and updated unless pt declined. Discussed healthy diet and lifestyle.  

## 2020-03-19 NOTE — Assessment & Plan Note (Signed)
Advanced directive: Received durable power of attorney (wife then daughter) - no healthcare info available. He is working on getting medical side of things completed.  

## 2020-03-19 NOTE — Progress Notes (Addendum)
This visit was conducted in person.  BP 122/74 (BP Location: Left Arm, Patient Position: Sitting, Cuff Size: Normal)   Pulse 76   Temp 97.8 F (36.6 C) (Temporal)   Ht 5\' 7"  (1.702 m)   Wt 166 lb 5 oz (75.4 kg)   SpO2 96%   BMI 26.05 kg/m   BP Readings from Last 3 Encounters:  03/19/20 122/74  01/26/20 (!) 122/64  03/31/19 128/68    CC: CPE Subjective:    Patient ID: Aaron Evangelist., male    DOB: Apr 26, 1946, 74 y.o.   MRN: [id number]  HPI: Aaron Patriarca. is a 74 y.o. male presenting on 03/19/2020 for Annual Exam (Prt 2. )   Saw health advisor this week for medicare wellness visit. Note reviewed.    No exam data present    Clinical Support from 03/16/2020 in [place] at [facility] Total Score 0      Fall Risk  03/16/2020 05/28/2019 01/27/2019 01/07/2018 01/02/2017  Falls in the past year? 0 0 0 No No  Comment - Emmi Telephone Survey: data to providers prior to load - - -  Number falls in past yr: 0 - - - -  Injury with Fall? 0 - - - -  Risk for fall due to : Medication side effect - - - -  Follow up Falls evaluation completed;Falls prevention discussed - - - -    Presumed prostatitis 01/2019 s/p 2 wk bactrim course.  Known multilevel herniated discs by MRI 11/2014.   Preventative: COLONOSCOPY Date: 05/2005 1 polyp, int hem, told ok to rpt 10 yrs ([facility]). Cologuard WNL 12/2016 - rpt this year.  Prostate screening - desired yearly. Nocturia 2-3x (chronic). Overall strong stream but notes intermittent discomfort to urination.  Lung cancer screen - not eligible  Flu shot declines  COVID vaccine - Pfizer 10/2199 x2 Pneumovax 2014, prevnar 12/2013  Tdap 12/2011  Zostavax 2013 Shingrix - declines  Advanced directive: Received durable power of attorney (wife then daughter) - no healthcare info available. He is working on getting medical side of things completed.  Seat belt use discussed  Sunscreen use discussed - no changing moles. Sees  dermatologist regularly Non smoker  Alcohol - seldom Dentist - Q6 mo Eye exam Q6 mo - borderline glaucoma Bowel - no constipation Bladder - no incontinence  Lives with wife  Grown children  Occupation: was Government social research officer, now owns landscaping business  Edu: Sports administrator  Activity: stays active at work, Psychologist, forensic mi/day,back exercises 30 min every night. Diet: good water, fruits/vegetables some. Follows mediterranean diet.     Relevant past medical, surgical, family and social history reviewed and updated as indicated. Interim medical history since our last visit reviewed. Allergies and medications reviewed and updated. Outpatient Medications Prior to Visit  Medication Sig Dispense Refill  . beta carotene w/minerals (OCUVITE) tablet Take 1 tablet by mouth daily.    . cholecalciferol (VITAMIN D) 1000 UNITS tablet Take 1,000 Units by mouth daily.    Marland Kitchen etodolac (LODINE) 400 MG tablet Take 1 tablet (400 mg total) by mouth 2 (two) times daily as needed. 60 tablet 1  . LUTEIN PO Take by mouth.    . Multiple Vitamins-Minerals (LUTEIN-ZEAXANTHIN PO) Take by mouth.    . mupirocin ointment (BACTROBAN) 2 % Place 1 application into the nose 2 (two) times daily. (Patient taking differently: Place 1 application into the nose 2 (two) times daily. As needed) 30 g 1  . amLODipine (  NORVASC) 5 MG tablet TAKE 1 TABLET BY MOUTH EVERY DAY 90 tablet 0  . hydrochlorothiazide (HYDRODIURIL) 25 MG tablet TAKE 1 TABLET BY MOUTH EVERY DAY 90 tablet 1  . losartan (COZAAR) 25 MG tablet TAKE 1 TABLET BY MOUTH EVERY DAY 90 tablet 1  . Omega-3 Fatty Acids (OMEGA 3 PO) Take by mouth.    . predniSONE (DELTASONE) 20 MG tablet Take two tablets daily for 3 days followed by one tablet daily for 4 days (Patient not taking: Reported on 03/16/2020) 10 tablet 0   No facility-administered medications prior to visit.     Per HPI unless specifically indicated in ROS section below Review of Systems  Constitutional:  Negative for activity change, appetite change, chills, fatigue, fever and unexpected weight change.  HENT: Negative for hearing loss.   Eyes: Negative for visual disturbance.  Respiratory: Negative for cough, chest tightness, shortness of breath and wheezing.   Cardiovascular: Negative for chest pain, palpitations and leg swelling.  Gastrointestinal: Negative for abdominal distention, abdominal pain, blood in stool, constipation, diarrhea, nausea and vomiting.  Genitourinary: Negative for difficulty urinating and hematuria.  Musculoskeletal: Negative for arthralgias, myalgias and neck pain.  Skin: Negative for rash.  Neurological: Negative for dizziness, seizures, syncope and headaches.  Hematological: Negative for adenopathy. Does not bruise/bleed easily.  Psychiatric/Behavioral: Negative for dysphoric mood. The patient is not nervous/anxious.    Objective:  BP 122/74 (BP Location: Left Arm, Patient Position: Sitting, Cuff Size: Normal)   Pulse 76   Temp 97.8 F (36.6 C) (Temporal)   Ht 5\' 7"  (1.702 m)   Wt 166 lb 5 oz (75.4 kg)   SpO2 96%   BMI 26.05 kg/m   Wt Readings from Last 3 Encounters:  03/19/20 166 lb 5 oz (75.4 kg)  01/26/20 172 lb 6.4 oz (78.2 kg)  03/31/19 166 lb (75.3 kg)      Physical Exam Vitals and nursing note reviewed.  Constitutional:      General: He is not in acute distress.    Appearance: Normal appearance. He is well-developed. He is not ill-appearing.  HENT:     Head: Normocephalic and atraumatic.     Right Ear: Hearing, tympanic membrane, ear canal and external ear normal.     Left Ear: Hearing, tympanic membrane, ear canal and external ear normal.  Eyes:     General: No scleral icterus.    Extraocular Movements: Extraocular movements intact.     Conjunctiva/sclera: Conjunctivae normal.     Pupils: Pupils are equal, round, and reactive to light.  Neck:     Thyroid: No thyroid mass or thyromegaly.  Cardiovascular:     Rate and Rhythm: Normal rate  and regular rhythm.     Pulses: Normal pulses.          Radial pulses are 2+ on the right side and 2+ on the left side.     Heart sounds: Normal heart sounds. No murmur heard.   Pulmonary:     Effort: Pulmonary effort is normal. No respiratory distress.     Breath sounds: Normal breath sounds. No wheezing, rhonchi or rales.  Abdominal:     General: Abdomen is flat. Bowel sounds are normal. There is no distension.     Palpations: Abdomen is soft. There is no mass.     Tenderness: There is no abdominal tenderness. There is no guarding or rebound.     Hernia: No hernia is present.  Genitourinary:    Prostate: Enlarged (35gm). Not tender and  no nodules present.     Rectum: Normal. No mass, tenderness, anal fissure, external hemorrhoid or internal hemorrhoid. Normal anal tone.  Musculoskeletal:        General: Normal range of motion.     Cervical back: Normal range of motion and neck supple.     Right lower leg: No edema.     Left lower leg: No edema.  Lymphadenopathy:     Cervical: No cervical adenopathy.  Skin:    General: Skin is warm and dry.     Findings: No rash.  Neurological:     General: No focal deficit present.     Mental Status: He is alert and oriented to person, place, and time.     Comments: CN grossly intact, station and gait intact  Psychiatric:        Mood and Affect: Mood normal.        Behavior: Behavior normal.        Thought Content: Thought content normal.        Judgment: Judgment normal.       Results for orders placed or performed in visit on 03/19/20  POCT Urinalysis Dipstick (Automated)  Result Value Ref Range   Color, UA yellow    Clarity, UA clear    Glucose, UA Negative Negative   Bilirubin, UA negative    Ketones, UA negative    Spec Grav, UA 1.015 1.010 - 1.025   Blood, UA negative    pH, UA 6.5 5.0 - 8.0   Protein, UA Negative Negative   Urobilinogen, UA 0.2 0.2 or 1.0 E.U./dL   Nitrite, UA negative    Leukocytes, UA Negative Negative    Assessment & Plan:  This visit occurred during the SARS-CoV-2 public health emergency.  Safety protocols were in place, including screening questions prior to the visit, additional usage of staff PPE, and extensive cleaning of exam room while observing appropriate contact time as indicated for disinfecting solutions.   Problem List Items Addressed This Visit    Prediabetes    Discussed healthy diet choices - encouraged limiting simple carbs.       Hypertension    Chronic, stable. Continue current regimen.       Relevant Medications   amLODipine (NORVASC) 5 MG tablet   hydrochlorothiazide (HYDRODIURIL) 25 MG tablet   losartan (COZAAR) 25 MG tablet   Encounter for general adult medical examination with abnormal findings - Primary    Preventative protocols reviewed and updated unless pt declined. Discussed healthy diet and lifestyle.       Elevated PSA    Mild elevation, anticipate component of BPH. Reassuring DRE. Check UA today, recheck PSA in 6 wks. If elevation persists, low threshold to refer to uro.  Treated for prostatitis with 2 wk bactrim course 01/2019.       Relevant Orders   PSA, Total with Reflex to PSA, Free   BPH (benign prostatic hyperplasia)    Noted on exam today.       Relevant Orders   POCT Urinalysis Dipstick (Automated) (Completed)   Advanced care planning/counseling discussion    Advanced directive: Received durable power of attorney (wife then daughter) - no healthcare info available. He is working on getting medical side of things completed.        Other Visit Diagnoses    Need for influenza vaccination       Relevant Orders   Flu Vaccine QUAD High Dose(Fluad) (Completed)       Meds ordered this  encounter  Medications  . amLODipine (NORVASC) 5 MG tablet    Sig: Take 1 tablet (5 mg total) by mouth daily.    Dispense:  90 tablet    Refill:  3  . hydrochlorothiazide (HYDRODIURIL) 25 MG tablet    Sig: Take 1 tablet (25 mg total) by mouth  daily.    Dispense:  90 tablet    Refill:  3  . losartan (COZAAR) 25 MG tablet    Sig: Take 1 tablet (25 mg total) by mouth daily.    Dispense:  90 tablet    Refill:  3   Orders Placed This Encounter  Procedures  . Flu Vaccine QUAD High Dose(Fluad)  . PSA, Total with Reflex to PSA, Free    Standing Status:   Future    Standing Expiration Date:   03/19/2021  . POCT Urinalysis Dipstick (Automated)    Patient instructions: Flu shot today Urinalysis today. Return in 6 weeks for lab visit only to recheck prostate level.  We will sign you up for cologuard.  If interested, check with pharmacy about new 2 shot shingles series (shingrix).  You are doing well today. Continue current medicines.  Continue mediterranean diet.  Return as needed or in 1 year for next physical.   Follow up plan: Return in about 1 year (around 03/19/2021) for annual exam, prior fasting for blood work, medicare wellness visit.  Ria Bush, MD

## 2020-03-19 NOTE — Assessment & Plan Note (Signed)
Mild elevation, anticipate component of BPH. Reassuring DRE. Check UA today, recheck PSA in 6 wks. If elevation persists, low threshold to refer to uro.  Treated for prostatitis with 2 wk bactrim course 01/2019.

## 2020-03-19 NOTE — Assessment & Plan Note (Signed)
Chronic, stable. Continue current regimen. 

## 2020-03-19 NOTE — Addendum Note (Signed)
Addended by: Ria Bush on: 03/19/2020 09:44 AM   Modules accepted: Orders

## 2020-03-19 NOTE — Addendum Note (Signed)
Addended by: Brenton Grills on: 1/55/2080 22:33 AM   Modules accepted: Orders

## 2020-03-23 ENCOUNTER — Other Ambulatory Visit: Payer: Self-pay | Admitting: Family Medicine

## 2020-03-24 ENCOUNTER — Other Ambulatory Visit: Payer: Self-pay | Admitting: Family Medicine

## 2020-03-28 ENCOUNTER — Other Ambulatory Visit: Payer: Self-pay | Admitting: Family Medicine

## 2020-03-29 NOTE — Telephone Encounter (Signed)
Med was filled at an appt on 01/26/20 for back pain, #[phone] with 1 refill. Pt has had his CPE on 03/19/20 and future CPE scheduled for 03/22/21

## 2020-04-05 DIAGNOSIS — Z1211 Encounter for screening for malignant neoplasm of colon: Secondary | ICD-10-CM | POA: Diagnosis not present

## 2020-04-05 DIAGNOSIS — Z1212 Encounter for screening for malignant neoplasm of rectum: Secondary | ICD-10-CM | POA: Diagnosis not present

## 2020-04-05 LAB — COLOGUARD: Cologuard: NEGATIVE

## 2020-04-11 LAB — COLOGUARD: COLOGUARD: NEGATIVE

## 2020-04-14 ENCOUNTER — Encounter: Payer: Self-pay | Admitting: Family Medicine

## 2020-04-27 ENCOUNTER — Ambulatory Visit: Payer: PPO | Admitting: Dermatology

## 2020-04-27 ENCOUNTER — Other Ambulatory Visit: Payer: Self-pay

## 2020-04-27 DIAGNOSIS — L570 Actinic keratosis: Secondary | ICD-10-CM

## 2020-04-27 DIAGNOSIS — Z86007 Personal history of in-situ neoplasm of skin: Secondary | ICD-10-CM

## 2020-04-27 DIAGNOSIS — Z85828 Personal history of other malignant neoplasm of skin: Secondary | ICD-10-CM | POA: Diagnosis not present

## 2020-04-27 DIAGNOSIS — L578 Other skin changes due to chronic exposure to nonionizing radiation: Secondary | ICD-10-CM

## 2020-04-27 MED ORDER — CALCIPOTRIENE 0.005 % EX CREA: TOPICAL_CREAM | CUTANEOUS | 0 refills | Status: DC

## 2020-04-27 MED ORDER — FLUOROURACIL 5 % EX CREA: TOPICAL_CREAM | CUTANEOUS | 0 refills | Status: DC

## 2020-04-27 NOTE — Progress Notes (Signed)
Follow-Up Visit   Subjective  Aaron Brown. is a 74 y.o. male who presents for the following: Follow-up (6 mo f/u for AKs) and other (pt has a concern about BCC that was removed and treated with EDC at last visit. Pt states that the area frequently bleeds and scabs.). He used 5FU/Vit D cream bid for about a week on his face with good results.  He has a h/o multiple BCCs.  He also had a SCCIS treated on his left hand with EDC last visit.    The following portions of the chart were reviewed this encounter and updated as appropriate:     Review of Systems: No other skin or systemic complaints except as noted in HPI or Assessment and Plan.   Objective  Well appearing patient in no apparent distress; mood and affect are within normal limits.  A focused examination was performed including scalp, face, arms, and hands. Relevant physical exam findings are noted in the Assessment and Plan.  Objective  Left Hand dorsum x 5, right ear x 2, right postauricular x 1, right hand x 8, scalp x 4, right arm x 1 (21): Erythematous thin papules/macules with gritty scale scalp, hands, R forearm Face clear   Objective  left anterior alar crease: 3 mm pink pearly papule  Images    Assessment & Plan  AK (actinic keratosis) (21) Left Hand dorsum x 5, right ear x 2, right postauricular x 1, right hand x 8, scalp x 4, right arm x 1  Cryotherapy today Face much improved post tx with 5FU/Vit D cream  Once healed, apply fluorouracil 5% cream and calcipotriene 0.005% cream twice a day for 10-14 days to hands, arms, and scalp.   Discussed that skin will get red and scab with treatment.  Avoid sun exposure during treatment course.  Destruction of lesion - Left Hand dorsum x 5, right ear x 2, right postauricular x 1, right hand x 8, scalp x 4, right arm x 1  Destruction method: cryotherapy   Informed consent: discussed and consent obtained   Lesion destroyed using liquid nitrogen: Yes   Region  frozen until ice ball extended beyond lesion: Yes   Outcome: patient tolerated procedure well with no complications   Post-procedure details: wound care instructions given    Ordered Medications: fluorouracil (EFUDEX) 5 % cream calcipotriene (DOVONOX) 0.005 % cream  History of basal cell carcinoma (BCC) left anterior alar crease  Recurrent at [facility] site (EDC of bx-proven BCC performed 4/21) Discussed MOHs surgery due to [facility] growing back and bleeding from site.   Will refer to [facility] in [place].   Ambulatory referral to Dermatology - left anterior alar crease   Actinic Damage - diffuse scaly erythematous macules with underlying dyspigmentation scalp, arms, hands - 5FU/VitD cream bid x 10-14 days to scalp arms, hands - Recommend daily broad spectrum sunscreen SPF 30+ to sun-exposed areas, reapply every 2 hours as needed.  - Call for new or changing lesions.  History of Basal Cell Carcinoma of the Skin - No evidence of recurrence today - Recommend regular full body skin exams - Recommend daily broad spectrum sunscreen SPF 30+ to sun-exposed areas, reapply every 2 hours as needed.  - Call if any new or changing lesions are noted between office visits  History of Squamous Cell Carcinoma in Situ of the Skin - No evidence of recurrence today L hand dorsum EDC 5/21 - Recommend regular full body skin exams - Recommend daily  broad spectrum sunscreen SPF 30+ to sun-exposed areas, reapply every 2 hours as needed.  - Call if any new or changing lesions are noted between office visits   Return in about 6 months (around 10/26/2020) for 6 mo f/u AKs, h/o BCC.   I, Harriett Sine, CMA, am acting as scribe for Brendolyn Patty, MD.  Documentation: I have reviewed the above documentation for accuracy and completeness, and I agree with the above.  Brendolyn Patty MD

## 2020-04-30 ENCOUNTER — Other Ambulatory Visit (INDEPENDENT_AMBULATORY_CARE_PROVIDER_SITE_OTHER): Payer: PPO

## 2020-04-30 ENCOUNTER — Other Ambulatory Visit: Payer: Self-pay

## 2020-04-30 DIAGNOSIS — R972 Elevated prostate specific antigen [PSA]: Secondary | ICD-10-CM | POA: Diagnosis not present

## 2020-05-04 LAB — REFLEX PSA, FREE
PSA, % Free: 21 % (calc) — ABNORMAL LOW (ref >25)
PSA, Free: 1 ng/mL

## 2020-05-04 LAB — PSA, TOTAL WITH REFLEX TO PSA, FREE: PSA, Total: 4.7 ng/mL — ABNORMAL HIGH (ref <=4.0)

## 2020-05-06 ENCOUNTER — Encounter: Payer: Self-pay | Admitting: Family Medicine

## 2020-05-06 DIAGNOSIS — R972 Elevated prostate specific antigen [PSA]: Secondary | ICD-10-CM

## 2020-05-17 ENCOUNTER — Ambulatory Visit: Payer: PPO | Admitting: Urology

## 2020-05-17 ENCOUNTER — Encounter: Payer: Self-pay | Admitting: Urology

## 2020-05-17 ENCOUNTER — Other Ambulatory Visit: Payer: Self-pay

## 2020-05-17 VITALS — BP 160/84 | HR 86 | Ht 68.0 in | Wt 166.0 lb

## 2020-05-17 DIAGNOSIS — R350 Frequency of micturition: Secondary | ICD-10-CM

## 2020-05-17 DIAGNOSIS — N401 Enlarged prostate with lower urinary tract symptoms: Secondary | ICD-10-CM | POA: Diagnosis not present

## 2020-05-17 DIAGNOSIS — R972 Elevated prostate specific antigen [PSA]: Secondary | ICD-10-CM | POA: Diagnosis not present

## 2020-05-17 NOTE — Progress Notes (Signed)
05/17/2020 11:08 AM   Aaron Brown 10-13-1945 [id number]  Referring provider: Ria Bush, MD [address] [place],  [place] [address]  Chief Complaint  Patient presents with  . Elevated PSA    HPI: Aaron Fontanez. is a 74 y.o. male seen at the request of Dr. Danise Mina for evaluation of an elevated PSA.   PSA 04/30/2020 was 4.7; 03/16/2020 4.62  Prior PSA results have been in the normal range  Baseline urinary frequency, urgency and nocturia x3  No recent change in voiding symptoms  No family history prostate cancer  Denies dysuria, gross hematuria  Denies flank, abdominal or pelvic pain     PMH: Past Medical History:  Diagnosis Date  . Actinic keratosis   . Asymmetrical left sensorineural hearing loss 12/2013   s/p MRI pending hearing aides [name])  . Basal cell carcinoma 10/21/2019   L ala crease  . Basal cell carcinoma 11/11/2018   R chin/excision  . Basal cell carcinoma 03/12/2018   L mid forearm  . Basal cell carcinoma 05/30/2016   R inf clavicle, L lateral canthus  . Basal cell carcinoma 01/06/2014   L elbow  . Blood in stool    in the past; ? hemorrhoids  . Cerebral microvascular disease 12/2013   by MRI, referred to neuro by ENT  . Colon polyp 05/2005   ?hyperplastic  . History of chicken pox   . Hypertension   . Increased pressure in the eye    Dr. Gloriann Loan  . Lower back pain    bulging disc with spinal stenosis L4/5 s/p surgery x2  . Prediabetes    A1c 6.0 (2012)  . Seasonal allergies   . Squamous acanthoma of face   . Squamous cell cancer of skin of hand 10/22/2018   R hand dorsum below first webspace  . Squamous cell carcinoma of neck 06/26/2013   Right anterior neck/excision  . Squamous cell carcinoma of skin 11/17/2019   Left dorsal hand. SCCis hypertrophic.    Surgical History: Past Surgical History:  Procedure Laterality Date  . BUNIONECTOMY  2003   Dr. Barkley Bruns  . COLONOSCOPY  05/2005   1 polyp, int  hem   . DOBUTAMINE STRESS ECHO  2013   normal per prior PCP records  . hemi-laminectomy  2000   Dr. Annette Stable, Dr. Hal Neer L4/L5  . LAMINECTOMY  1988   Dr. Pearlie Oyster; L5  . SQUAMOUS CELL CARCINOMA EXCISION  2014   face and hand    Home Medications:  Allergies as of 05/17/2020      Reactions   Lisinopril    May have caused lip swelling      Medication List       Accurate as of May 17, 2020 11:08 AM. If you have any questions, ask your nurse or doctor.        amLODipine 5 MG tablet Commonly known as: NORVASC TAKE 1 TABLET BY MOUTH EVERY DAY   beta carotene w/minerals tablet Take 1 tablet by mouth daily.   calcipotriene 0.005 % cream Commonly known as: DOVONOX Apply twice a day everyday for 10-14 days to affected areas.   cholecalciferol 1000 units tablet Commonly known as: VITAMIN D Take 1,000 Units by mouth daily.   etodolac 400 MG tablet Commonly known as: LODINE TAKE 1 TABLET (400 MG TOTAL) BY MOUTH 2 (TWO) TIMES DAILY AS NEEDED.   fluorouracil 5 % cream Commonly known as: EFUDEX Apply twice a day everyday for 10-14 days  to affected areas.   hydrochlorothiazide 25 MG tablet Commonly known as: HYDRODIURIL Take 1 tablet (25 mg total) by mouth daily.   losartan 25 MG tablet Commonly known as: COZAAR Take 1 tablet (25 mg total) by mouth daily.   LUTEIN PO Take by mouth.   LUTEIN-ZEAXANTHIN PO Take by mouth.   mupirocin ointment 2 % Commonly known as: BACTROBAN Place 1 application into the nose 2 (two) times daily. What changed: additional instructions       Allergies:  Allergies  Allergen Reactions  . Lisinopril     May have caused lip swelling    Family History: Family History  Problem Relation Age of Onset  . Hypertension Mother   . Arthritis Father   . Hypertension Father   . Alcohol abuse Maternal Grandfather   . Cancer Maternal Aunt        colon  . Cancer Cousin        colon  . Cancer Cousin        breast    Social History:   reports that he has never smoked. He has never used smokeless tobacco. He reports current alcohol use. He reports that he does not use drugs.   Physical Exam: BP (!) 160/84   Pulse 86   Ht 5\' 8"  (1.727 m)   Wt 166 lb (75.3 kg)   BMI 25.24 kg/m   Constitutional:  Alert and oriented, No acute distress. HEENT: [place] AT, moist mucus membranes.  Trachea midline, no masses. Cardiovascular: No clubbing, cyanosis, or edema. Respiratory: Normal respiratory effort, no increased work of breathing. GI: Abdomen is soft, nontender, nondistended, no abdominal masses GU: Prostate 35 g, smooth without nodules Skin: No rashes, bruises or suspicious lesions. Neurologic: Grossly intact, no focal deficits, moving all 4 extremities. Psychiatric: Normal mood and affect.   Assessment & Plan:    1.  Elevated PSA  Although PSA is a prostate cancer screening test he was informed that cancer is not the most common cause of an elevated PSA. Other potential causes including BPH and inflammation were discussed. He was informed that the only way to adequately diagnose prostate cancer would be a transrectal ultrasound and biopsy of the prostate. The procedure was discussed including potential risks of bleeding and infection/sepsis. He was also informed that a negative biopsy does not conclusively rule out the possibility that prostate cancer may be present and that continued monitoring is required. The use of newer adjunctive blood tests including PHI and 4kScore were discussed. The use of multiparametric prostate MRI was also discussed however is not typically used for initial evaluation of an elevated PSA. Continued periodic surveillance was also discussed.  He has initially elected a 30-day alpha-blocker course with a repeat PSA in 1 month  He will think over these other options in the event his PSA remains persistently elevated   Abbie Sons, MD  [facility] [address] [place], [place] [address] [phone]

## 2020-05-18 ENCOUNTER — Encounter: Payer: Self-pay | Admitting: Urology

## 2020-05-18 DIAGNOSIS — N401 Enlarged prostate with lower urinary tract symptoms: Secondary | ICD-10-CM | POA: Insufficient documentation

## 2020-05-18 MED ORDER — TAMSULOSIN HCL 0.4 MG PO CAPS: 0.4000 mg | ORAL_CAPSULE | Freq: Every day | ORAL | 0 refills | Status: DC

## 2020-05-20 ENCOUNTER — Other Ambulatory Visit: Payer: Self-pay | Admitting: *Deleted

## 2020-05-20 MED ORDER — TAMSULOSIN HCL 0.4 MG PO CAPS
0.4000 mg | ORAL_CAPSULE | Freq: Every day | ORAL | 0 refills | Status: DC
Start: 2020-05-20 — End: 2020-06-11

## 2020-06-11 ENCOUNTER — Other Ambulatory Visit: Payer: Self-pay | Admitting: Urology

## 2020-06-11 DIAGNOSIS — C44311 Basal cell carcinoma of skin of nose: Secondary | ICD-10-CM | POA: Diagnosis not present

## 2020-06-16 ENCOUNTER — Other Ambulatory Visit: Payer: Self-pay | Admitting: *Deleted

## 2020-06-16 ENCOUNTER — Other Ambulatory Visit: Payer: Self-pay

## 2020-06-16 ENCOUNTER — Other Ambulatory Visit: Payer: PPO

## 2020-06-16 DIAGNOSIS — R972 Elevated prostate specific antigen [PSA]: Secondary | ICD-10-CM

## 2020-06-17 ENCOUNTER — Telehealth: Payer: Self-pay | Admitting: Urology

## 2020-06-17 LAB — PSA: Prostate Specific Ag, Serum: 5.1 ng/mL — ABNORMAL HIGH (ref 0.0–4.0)

## 2020-06-17 NOTE — Telephone Encounter (Signed)
Patient will call back and let us know what he wants to do.

## 2020-06-17 NOTE — Telephone Encounter (Signed)
Repeat PSA is higher at 5.1.  We discussed management options at last visit and since his PSA is higher I would recommend scheduling either prostate biopsy or prostate MRI.  Please let me know if he has any questions.

## 2020-06-28 ENCOUNTER — Telehealth: Payer: Self-pay

## 2020-06-28 ENCOUNTER — Telehealth (INDEPENDENT_AMBULATORY_CARE_PROVIDER_SITE_OTHER): Payer: PPO | Admitting: Family Medicine

## 2020-06-28 ENCOUNTER — Other Ambulatory Visit: Payer: Self-pay

## 2020-06-28 ENCOUNTER — Encounter: Payer: Self-pay | Admitting: Family Medicine

## 2020-06-28 VITALS — BP 148/85 | HR 90 | Temp 98.3°F | Ht 68.0 in | Wt 168.0 lb

## 2020-06-28 DIAGNOSIS — R972 Elevated prostate specific antigen [PSA]: Secondary | ICD-10-CM

## 2020-06-28 DIAGNOSIS — R350 Frequency of micturition: Secondary | ICD-10-CM | POA: Diagnosis not present

## 2020-06-28 DIAGNOSIS — N401 Enlarged prostate with lower urinary tract symptoms: Secondary | ICD-10-CM

## 2020-06-28 DIAGNOSIS — I10 Essential (primary) hypertension: Secondary | ICD-10-CM

## 2020-06-28 NOTE — Assessment & Plan Note (Signed)
bp mildly elevated today, pt attributes to increased stress in setting of worried about visit today, and mother with recent MI he is figuring out care plan once she's discharged from hospital. He will start monitoring BP more closely at home and let me know if consistently >140/90 to consider losartan titration.

## 2020-06-28 NOTE — Progress Notes (Signed)
Patient ID: Aaron Brown., male    DOB: 27-Mar-1946, 74 y.o.   MRN: [id number]  Virtual visit completed through Bailey, a video enabled telemedicine application. Due to national recommendations of social distancing due to COVID-19, a virtual visit is felt to be most appropriate for this patient at this time. Reviewed limitations, risks, security and privacy concerns of performing a virtual visit and the availability of in person appointments. I also reviewed that there may be a patient responsible charge related to this service. The patient agreed to proceed.   Patient location: home Provider location: [place] [facility], office Persons participating in this virtual visit: patient, provider   If any vitals were documented, they were collected by patient at home unless specified below.    BP (!) 148/85   Pulse 90   Temp 98.3 F (36.8 C)   Ht 5\' 8"  (1.727 m)   Wt 168 lb (76.2 kg)   BMI 25.54 kg/m    CC: f/u, discuss prostate Subjective:   [name] is a 74 y.o. male presenting on 06/28/2020 for Follow-up (Wants to discuss prostate issue. )   Mother just had mild MI over Christmas.  He was somewhat nervous about today's visit.   Presumed prostatitis s/p 2 wk bactrim course 01/2019. Recent PSA elevation noted on physical 03/2020. Referred to urology - rpt PSA 5.1. rec prostate biopsy and/or prostate MRI. Started on flomax 0.4mg  nightly - this has helped improve urinary flow.  Lab Results  Component Value Date   PSA1 5.1 (H) 06/16/2020   PSA 4.62 (H) 03/16/2020   PSA 2.69 01/24/2019   PSA 3.00 01/07/2018  He's started using pygeum and saw palmetto as well as BioAstin (astaxanthin).   HTN - Compliant with current antihypertensive regimen of amlodipine 5mg  daily, hctz 25mg  daily, losartan 25mg  daily. Does not check blood pressures at home.       Relevant past medical, surgical, family and social history reviewed and updated as indicated. Interim  medical history since our last visit reviewed. Allergies and medications reviewed and updated. Outpatient Medications Prior to Visit  Medication Sig Dispense Refill  . amLODipine (NORVASC) 5 MG tablet TAKE 1 TABLET BY MOUTH EVERY DAY 90 tablet 3  . beta carotene w/minerals (OCUVITE) tablet Take 1 tablet by mouth daily.    . calcipotriene (DOVONOX) 0.005 % cream Apply twice a day everyday for 10-14 days to affected areas. 60 g 0  . cholecalciferol (VITAMIN D) 1000 UNITS tablet Take 1,000 Units by mouth daily.    Marland Kitchen etodolac (LODINE) 400 MG tablet TAKE 1 TABLET (400 MG TOTAL) BY MOUTH 2 (TWO) TIMES DAILY AS NEEDED. 60 tablet 1  . fluorouracil (EFUDEX) 5 % cream Apply twice a day everyday for 10-14 days to affected areas. 40 g 0  . hydrochlorothiazide (HYDRODIURIL) 25 MG tablet Take 1 tablet (25 mg total) by mouth daily. 90 tablet 3  . losartan (COZAAR) 25 MG tablet Take 1 tablet (25 mg total) by mouth daily. 90 tablet 3  . LUTEIN PO Take by mouth.    . Multiple Vitamins-Minerals (LUTEIN-ZEAXANTHIN PO) Take by mouth.    . tamsulosin (FLOMAX) 0.4 MG CAPS capsule TAKE 1 CAPSULE BY MOUTH EVERY DAY 30 capsule 0   No facility-administered medications prior to visit.     Per HPI unless specifically indicated in ROS section below Review of Systems Objective:  BP (!) 148/85   Pulse 90   Temp 98.3 F (36.8 C)  Ht 5\' 8"  (1.727 m)   Wt 168 lb (76.2 kg)   BMI 25.54 kg/m   Wt Readings from Last 3 Encounters:  06/28/20 168 lb (76.2 kg)  05/17/20 166 lb (75.3 kg)  03/19/20 166 lb 5 oz (75.4 kg)       Physical exam: Gen: alert, NAD, not ill appearing Pulm: speaks in complete sentences without increased work of breathing Psych: normal mood, normal thought content      Results for orders placed or performed in visit on 06/16/20  PSA  Result Value Ref Range   Prostate Specific Ag, Serum 5.1 (H) 0.0 - 4.0 ng/mL   Assessment & Plan:   Problem List Items Addressed This Visit    Hypertension     bp mildly elevated today, pt attributes to increased stress in setting of worried about visit today, and mother with recent MI he is figuring out care plan once she's discharged from hospital. He will start monitoring BP more closely at home and let me know if consistently >140/90 to consider losartan titration.       Elevated PSA - Primary    Appreciate uro care. Reviewed recent urology note. PSA elevated on retesting.  uro suggesting biopsy as next step, pt hesitant concerned about possible complications.  Discussed all this, biopsy is reasonable next step, but suggested he as uro about other lab tests available prior to biopsy if pt remains hesitant.       BPH (benign prostatic hyperplasia)       No orders of the defined types were placed in this encounter.  No orders of the defined types were placed in this encounter.   I discussed the assessment and treatment plan with the patient. The patient was provided an opportunity to ask questions and all were answered. The patient agreed with the plan and demonstrated an understanding of the instructions. The patient was advised to call back or seek an in-person evaluation if the symptoms worsen or if the condition fails to improve as anticipated.  Follow up plan: Return if symptoms worsen or fail to improve.  06/18/20, MD

## 2020-06-28 NOTE — Telephone Encounter (Signed)
I think an MRI would be a better choice over 4K. Order was entered

## 2020-06-28 NOTE — Telephone Encounter (Signed)
Incoming call from pt questioning if he is still a candidate for the 4kscore test vs. Prostate MRI based on his recent PSA. Pt also notes he has been taking several new supplements for the prostate x 1 week. Saw Palmentto, Astaxthin, and Pygum. Please advise.

## 2020-06-28 NOTE — Assessment & Plan Note (Addendum)
Appreciate uro care. Reviewed recent urology note. PSA elevated on retesting.  uro suggesting biopsy as next step, pt hesitant concerned about possible complications.  Discussed all this, biopsy is reasonable next step, but suggested he as uro about other lab tests available prior to biopsy if pt remains hesitant.

## 2020-06-29 NOTE — Telephone Encounter (Signed)
Notified patient as instructed, patient pleased °

## 2020-07-14 ENCOUNTER — Ambulatory Visit: Payer: PPO

## 2020-07-19 ENCOUNTER — Encounter: Payer: Self-pay | Admitting: Family Medicine

## 2020-08-04 ENCOUNTER — Other Ambulatory Visit: Payer: Self-pay

## 2020-08-04 ENCOUNTER — Ambulatory Visit
Admission: RE | Admit: 2020-08-04 | Discharge: 2020-08-04 | Disposition: A | Discharge status: Home / Self Care | Payer: PPO | Source: Ambulatory Visit | Attending: Urology | Admitting: Urology

## 2020-08-04 DIAGNOSIS — R972 Elevated prostate specific antigen [PSA]: Secondary | ICD-10-CM | POA: Insufficient documentation

## 2020-08-04 DIAGNOSIS — N402 Nodular prostate without lower urinary tract symptoms: Secondary | ICD-10-CM | POA: Diagnosis not present

## 2020-08-04 DIAGNOSIS — R590 Localized enlarged lymph nodes: Secondary | ICD-10-CM | POA: Diagnosis not present

## 2020-08-04 MED ORDER — GADOBUTROL 1 MMOL/ML IV SOLN
7.0000 mL | Freq: Once | INTRAVENOUS | Status: AC | PRN
  Administered 2020-08-04: 7 mL via INTRAVENOUS

## 2020-08-06 ENCOUNTER — Encounter: Payer: Self-pay | Admitting: Urology

## 2020-08-06 ENCOUNTER — Telehealth: Payer: Self-pay

## 2020-08-06 DIAGNOSIS — R972 Elevated prostate specific antigen [PSA]: Secondary | ICD-10-CM

## 2020-08-06 DIAGNOSIS — R935 Abnormal findings on diagnostic imaging of other abdominal regions, including retroperitoneum: Secondary | ICD-10-CM

## 2020-08-06 NOTE — Telephone Encounter (Signed)
Pt LM on triage line states that he is calling for results of recent MRI. Please advise.

## 2020-08-08 NOTE — Telephone Encounter (Signed)
Patient notified via [place].  Fusion biopsy was recommended

## 2020-09-01 DIAGNOSIS — R972 Elevated prostate specific antigen [PSA]: Secondary | ICD-10-CM | POA: Diagnosis not present

## 2020-09-01 DIAGNOSIS — C61 Malignant neoplasm of prostate: Secondary | ICD-10-CM | POA: Diagnosis not present

## 2020-09-06 ENCOUNTER — Ambulatory Visit: Payer: PPO | Admitting: Urology

## 2020-09-09 ENCOUNTER — Other Ambulatory Visit: Payer: Self-pay | Admitting: Urology

## 2020-09-10 ENCOUNTER — Ambulatory Visit: Payer: PPO | Admitting: Urology

## 2020-09-10 ENCOUNTER — Other Ambulatory Visit: Payer: Self-pay

## 2020-09-10 ENCOUNTER — Encounter: Payer: Self-pay | Admitting: Urology

## 2020-09-10 VITALS — BP 148/74 | HR 87 | Ht 68.0 in | Wt 169.0 lb

## 2020-09-10 DIAGNOSIS — C61 Malignant neoplasm of prostate: Secondary | ICD-10-CM | POA: Diagnosis not present

## 2020-09-10 NOTE — Progress Notes (Signed)
09/10/2020 8:57 AM   Aaron Brown. Sep 28, 1945 [id number]  Referring provider: Ria Bush, MD [address] [place],  [place] [address]  Chief Complaint  Patient presents with  . Other    HPI: 75 y.o. male presents for prostate biopsy follow-up.   Initially seen 05/2020 for PSA bump 2.69-4.7  Prostate MRI PI-RADS 4 lesions x2 and PI-RADS 3 lesion  46 cc gland  Fusion biopsy [facility] 09/01/2020; prostate volume 40 g; 4 biopsies taken at each ROI lesion + standard 12 core template biopsy  No postbiopsy complaints  Pathology: All ROI lesions showed benign prostate tissue  Gleason 3+3 adenocarcinoma RLM, RA, RLA (5%, 20%, 5%)   PMH: Past Medical History:  Diagnosis Date  . Actinic keratosis   . Asymmetrical left sensorineural hearing loss 12/2013   s/p MRI pending hearing aides [name])  . Basal cell carcinoma 10/21/2019   L ala crease  . Basal cell carcinoma 11/11/2018   R chin/excision  . Basal cell carcinoma 03/12/2018   L mid forearm  . Basal cell carcinoma 05/30/2016   R inf clavicle, L lateral canthus  . Basal cell carcinoma 01/06/2014   L elbow  . Blood in stool    in the past; ? hemorrhoids  . Cerebral microvascular disease 12/2013   by MRI, referred to neuro by ENT  . Colon polyp 05/2005   ?hyperplastic  . History of chicken pox   . Hypertension   . Increased pressure in the eye    Dr. Gloriann Loan  . Lower back pain    bulging disc with spinal stenosis L4/5 s/p surgery x2  . Prediabetes    A1c 6.0 (2012)  . Seasonal allergies   . Squamous acanthoma of face   . Squamous cell cancer of skin of hand 10/22/2018   R hand dorsum below first webspace  . Squamous cell carcinoma of neck 06/26/2013   Right anterior neck/excision  . Squamous cell carcinoma of skin 11/17/2019   Left dorsal hand. SCCis hypertrophic.    Surgical History: Past Surgical History:  Procedure Laterality Date  . BUNIONECTOMY  2003   Dr. Barkley Bruns  .  COLONOSCOPY  05/2005   1 polyp, int hem   . DOBUTAMINE STRESS ECHO  2013   normal per prior PCP records  . hemi-laminectomy  2000   Dr. Annette Stable, Dr. Hal Neer L4/L5  . LAMINECTOMY  1988   Dr. Pearlie Oyster; L5  . SQUAMOUS CELL CARCINOMA EXCISION  2014   face and hand    Home Medications:  Allergies as of 09/10/2020      Reactions   Lisinopril    May have caused lip swelling      Medication List       Accurate as of September 10, 2020  8:57 AM. If you have any questions, ask your nurse or doctor.        amLODipine 5 MG tablet Commonly known as: NORVASC TAKE 1 TABLET BY MOUTH EVERY DAY   beta carotene w/minerals tablet Take 1 tablet by mouth daily.   calcipotriene 0.005 % cream Commonly known as: DOVONOX Apply twice a day everyday for 10-14 days to affected areas.   cholecalciferol 1000 units tablet Commonly known as: VITAMIN D Take 1,000 Units by mouth daily.   etodolac 400 MG tablet Commonly known as: LODINE TAKE 1 TABLET (400 MG TOTAL) BY MOUTH 2 (TWO) TIMES DAILY AS NEEDED.   fluorouracil 5 % cream Commonly known as: EFUDEX Apply twice a day everyday  for 10-14 days to affected areas.   hydrochlorothiazide 25 MG tablet Commonly known as: HYDRODIURIL Take 1 tablet (25 mg total) by mouth daily.   losartan 25 MG tablet Commonly known as: COZAAR Take 1 tablet (25 mg total) by mouth daily.   LUTEIN PO Take by mouth.   LUTEIN-ZEAXANTHIN PO Take by mouth.       Allergies:  Allergies  Allergen Reactions  . Lisinopril     May have caused lip swelling    Family History: Family History  Problem Relation Age of Onset  . Hypertension Mother   . Arthritis Father   . Hypertension Father   . Alcohol abuse Maternal Grandfather   . Cancer Maternal Aunt        colon  . Cancer Cousin        colon  . Cancer Cousin        breast    Social History:  reports that he has never smoked. He has never used smokeless tobacco. He reports current alcohol use. He reports  that he does not use drugs.   Physical Exam: BP (!) 148/74   Pulse 87   Ht 5\' 8"  (1.727 m)   Wt 169 lb (76.7 kg)   BMI 25.70 kg/m   Constitutional:  Alert and oriented, No acute distress. HEENT: [place] AT, moist mucus membranes.  Trachea midline, no masses. Cardiovascular: No clubbing, cyanosis, or edema. Respiratory: Normal respiratory effort, no increased work of breathing.   Assessment & Plan:    1.  T1c prostate cancer (low risk)  The pathology report was discussed in detail  Management options were discussed including active surveillance and this is preferred for most patients  EBRT/brachytherapy was also discussed  Radical prostatectomy is also an option  We also discussed the availability of HIFU  I offered him an appointment in radiation oncology however he is primarily interested in surveillance  We discussed every 6 month PSA/DRE along with a repeat MRI and confirmatory biopsy within the next 1-2 years  I spent 30 total minutes on the day of the encounter including pre-visit review of the medical record, face-to-face time with the patient, and post visit ordering of labs/imaging/tests.   Abbie Sons, [place] [address], [place], [place] [address] [phone]

## 2020-11-02 ENCOUNTER — Encounter: Payer: Self-pay | Admitting: Dermatology

## 2020-11-02 ENCOUNTER — Other Ambulatory Visit: Payer: Self-pay | Admitting: Dermatology

## 2020-11-02 ENCOUNTER — Other Ambulatory Visit: Payer: Self-pay

## 2020-11-02 ENCOUNTER — Ambulatory Visit: Payer: PPO | Admitting: Dermatology

## 2020-11-02 DIAGNOSIS — L570 Actinic keratosis: Secondary | ICD-10-CM | POA: Diagnosis not present

## 2020-11-02 DIAGNOSIS — D692 Other nonthrombocytopenic purpura: Secondary | ICD-10-CM

## 2020-11-02 DIAGNOSIS — Z872 Personal history of diseases of the skin and subcutaneous tissue: Secondary | ICD-10-CM

## 2020-11-02 DIAGNOSIS — D489 Neoplasm of uncertain behavior, unspecified: Secondary | ICD-10-CM

## 2020-11-02 DIAGNOSIS — Z85828 Personal history of other malignant neoplasm of skin: Secondary | ICD-10-CM

## 2020-11-02 DIAGNOSIS — C44619 Basal cell carcinoma of skin of left upper limb, including shoulder: Secondary | ICD-10-CM | POA: Diagnosis not present

## 2020-11-02 NOTE — Progress Notes (Signed)
Follow-Up Visit   Subjective  Aaron Brown. is a 75 y.o. male who presents for the following: 6 month follow up (Patient here today for 6 month follow up. He has history of ak's on hands, scalp, face, and arms. He also has a history of bcc on left anterior alar crease and scc in situ in on left hand dorsum. He also has history of bcc on multiple sites.  He reports no new concerns today at follow. ).  H/o Mohs Left nasal ala.   The following portions of the chart were reviewed this encounter and updated as appropriate:       Objective  Well appearing patient in no apparent distress; mood and affect are within normal limits.  A focused examination was performed including face, scalp , arms. Relevant physical exam findings are noted in the Assessment and Plan.  Objective  left mid forearm: 8 mm pink scaly macule at medial edge of white scar left mid forearm        Objective  left hand x 2, left forearm x 7, right arm x 4, right hand x 2, left frontal scalp x 2, left temple x 2 , nose x 6, right temple x 1 (26): Erythematous thin papules/macules with gritty scale.   Right forearm pink macule at lateral edge of scar x 1  Assessment & Plan    Purpura - Chronic; persistent and recurrent.  Treatable, but not curable. - Violaceous macules and patches on arms  - Benign - Related to trauma, age, sun damage and/or use of blood thinners, chronic use of topical and/or oral steroids - Observe - Can use OTC arnica containing moisturizer such as Dermend Bruise Formula if desired - Call for worsening or other concerns  Neoplasm of uncertain behavior left mid forearm  Skin / nail biopsy Type of biopsy: tangential   Informed consent: discussed and consent obtained   Patient was prepped and draped in usual sterile fashion: Area prepped with alcohol. Anesthesia: the lesion was anesthetized in a standard fashion   Anesthetic:  0.5% bupivicaine w/ epinephrine 1-100,000 local  infiltration Instrument used: flexible razor blade   Hemostasis achieved with: pressure, aluminum chloride and electrodesiccation   Outcome: patient tolerated procedure well   Post-procedure details: wound care instructions given   Post-procedure details comment:  Ointment and small bandage applied  Specimen 1 - Surgical pathology Differential Diagnosis: r/o recurrent bcc   Check Margins: No 8 mm pink scaly macule at medial edge of white scar left mid forearm  R/o recurrent bcc  Discussed tx options if BCC. Patient would like to come back for excision    Actinic keratosis (26) left hand x 2, left forearm x 7, right arm x 4, right hand x 2, left frontal scalp x 2, left temple x 2 , nose x 6, right temple x 1  Prior to procedure, discussed risks of blister formation, small wound, skin dyspigmentation, or rare scar following cryotherapy.   lateral to scar L forearm- AK vrs recurrent BCC,  cryo today recheck on f/up  Actinic keratoses are precancerous spots that appear secondary to cumulative UV radiation exposure/sun exposure over time. They are chronic with expected duration over 1 year. A portion of actinic keratoses will progress to squamous cell carcinoma of the skin. It is not possible to reliably predict which spots will progress to skin cancer and so treatment is recommended to prevent development of skin cancer.  Recommend daily broad spectrum sunscreen SPF 30+ to  sun-exposed areas, reapply every 2 hours as needed.  Recommend staying in the shade or wearing long sleeves, sun glasses (UVA+UVB protection) and wide brim hats (4-inch brim around the entire circumference of the hat). Call for new or changing lesions.    Destruction of lesion - left hand x 2, left forearm x 7, right arm x 4, right hand x 2, left frontal scalp x 2, left temple x 2 , nose x 6, right temple x 1  Destruction method: cryotherapy   Informed consent: discussed and consent obtained   Lesion destroyed  using liquid nitrogen: Yes   Region frozen until ice ball extended beyond lesion: Yes   Outcome: patient tolerated procedure well with no complications   Post-procedure details: wound care instructions given     History of Basal Cell Carcinoma of the Skin - No evidence of recurrence today left anterior alar and left nasal alar  - Recommend regular full body skin exams - Recommend daily broad spectrum sunscreen SPF 30+ to sun-exposed areas, reapply every 2 hours as needed.  - Call if any new or changing lesions are noted between office visits  History of Squamous Cell Carcinoma in Situ of the Skin - No evidence of recurrence today on left hand dorsum  - Recommend regular full body skin exams - Recommend daily broad spectrum sunscreen SPF 30+ to sun-exposed areas, reapply every 2 hours as needed.  - Call if any new or changing lesions are noted between office visits   Return in about 7 weeks (around 12/21/2020) for surgery appointment.  I, Ruthell Rummage, CMA, am acting as scribe for Brendolyn Patty, MD.  Documentation: I have reviewed the above documentation for accuracy and completeness, and I agree with the above.  Brendolyn Patty MD

## 2020-11-02 NOTE — Patient Instructions (Addendum)
Actinic keratoses are precancerous spots that appear secondary to cumulative UV radiation exposure/sun exposure over time. They are chronic with expected duration over 1 year. A portion of actinic keratoses will progress to squamous cell carcinoma of the skin. It is not possible to reliably predict which spots will progress to skin cancer and so treatment is recommended to prevent development of skin cancer.  Recommend daily broad spectrum sunscreen SPF 30+ to sun-exposed areas, reapply every 2 hours as needed.  Recommend staying in the shade or wearing long sleeves, sun glasses (UVA+UVB protection) and wide brim hats (4-inch brim around the entire circumference of the hat). Call for new or changing lesions.  Biopsy Wound Care Instructions  1. Leave the original bandage on for 24 hours if possible.  If the bandage becomes soaked or soiled before that time, it is OK to remove it and examine the wound.  A small amount of post-operative bleeding is normal.  If excessive bleeding occurs, remove the bandage, place gauze over the site and apply continuous pressure (no peeking) over the area for 30 minutes. If this does not work, please call our clinic as soon as possible or page your doctor if it is after hours.   2. Once a day, cleanse the wound with soap and water. It is fine to shower. If a thick crust develops you may use a Q-tip dipped into dilute hydrogen peroxide (mix 1:1 with water) to dissolve it.  Hydrogen peroxide can slow the healing process, so use it only as needed.    3. After washing, apply petroleum jelly (Vaseline) or an antibiotic ointment if your doctor prescribed one for you, followed by a bandage.    4. For best healing, the wound should be covered with a layer of ointment at all times. If you are not able to keep the area covered with a bandage to hold the ointment in place, this may mean re-applying the ointment several times a day.  Continue this wound care until the wound has healed  and is no longer open.   Itching and mild discomfort is normal during the healing process. However, if you develop pain or severe itching, please call our office.   If you have any discomfort, you can take Tylenol (acetaminophen) or ibuprofen as directed on the bottle. (Please do not take these if you have an allergy to them or cannot take them for another reason).  Some redness, tenderness and white or yellow material in the wound is normal healing.  If the area becomes very sore and red, or develops a thick yellow-green material (pus), it may be infected; please notify us.    If you have stitches, return to clinic as directed to have the stitches removed. You will continue wound care for 2-3 days after the stitches are removed.   Wound healing continues for up to one year following surgery. It is not unusual to experience pain in the scar from time to time during the interval.  If the pain becomes severe or the scar thickens, you should notify the office.    A slight amount of redness in a scar is expected for the first six months.  After six months, the redness will fade and the scar will soften and fade.  The color difference becomes less noticeable with time.  If there are any problems, return for a post-op surgery check at your earliest convenience.  To improve the appearance of the scar, you can use silicone scar gel, cream, or  sheets (such as Mederma or Serica) every night for up to one year. These are available over the counter (without a prescription).  Please call our office at [phone] for any questions or concerns.   Cryotherapy Aftercare  . Wash gently with soap and water everyday.   Marland Kitchen Apply Vaseline and Band-Aid daily until healed.  If you have any questions or concerns for your doctor, please call our main line at [phone] and press option 4 to reach your doctor's medical assistant. If no one answers, please leave a voicemail as directed and we will return your call  as soon as possible. Messages left after 4 pm will be answered the following business day.   You may also send [place] a message via [place]. We typically respond to MyChart messages within 1-2 business days.  For prescription refills, please ask your pharmacy to contact our office. Our fax number is [phone].  If you have an urgent issue when the clinic is closed that cannot wait until the next business day, you can page your doctor at the number below.    Please note that while we do our best to be available for urgent issues outside of office hours, we are not available 24/7.   If you have an urgent issue and are unable to reach [place], you may choose to seek medical care at your doctor's office, retail clinic, urgent care center, or emergency room.  If you have a medical emergency, please immediately call 911 or go to the emergency department.  Pager Numbers  - Dr. Nehemiah Massed: [phone]  - Dr. Laurence Ferrari: [phone]  - Dr. Nicole Kindred: [phone]  In the event of inclement weather, please call our main line at [phone] for an update on the status of any delays or closures.  Dermatology Medication Tips: Please keep the boxes that topical medications come in in order to help keep track of the instructions about where and how to use these. Pharmacies typically print the medication instructions only on the boxes and not directly on the medication tubes.   If your medication is too expensive, please contact our office at [phone] option 4 or send [place] a message through [place].   We are unable to tell what your co-pay for medications will be in advance as this is different depending on your insurance coverage. However, we may be able to find a substitute medication at lower cost or fill out paperwork to get insurance to cover a needed medication.   If a prior authorization is required to get your medication covered by your insurance company, please allow [place] 1-2 business days to complete  this process.  Drug prices often vary depending on where the prescription is filled and some pharmacies may offer cheaper prices.  The website www.goodrx.com contains coupons for medications through different pharmacies. The prices here do not account for what the cost may be with help from insurance (it may be cheaper with your insurance), but the website can give you the price if you did not use any insurance.  - You can print the associated coupon and take it with your prescription to the pharmacy.  - You may also stop by our office during regular business hours and pick up a GoodRx coupon card.  - If you need your prescription sent electronically to a different pharmacy, notify our office through [facility] or by phone at [phone] option 4.

## 2020-11-08 ENCOUNTER — Telehealth: Payer: Self-pay

## 2020-11-08 NOTE — Telephone Encounter (Signed)
Patient advised biopsy of the left mid forearm was Superficial BCC, recurrent. Patient scheduled for excision 12/24/2020.

## 2020-11-08 NOTE — Telephone Encounter (Signed)
-----   Message from Brendolyn Patty, MD sent at 11/08/2020 10:00 AM EDT ----- Skin , left mid forearm SUPERFICIAL BASAL CELL CARCINOMA, CLOSE TO MARGIN  Recurrent BCC skin cancer.  Pt Should be scheduled for excision.

## 2020-12-27 ENCOUNTER — Other Ambulatory Visit: Payer: Self-pay

## 2020-12-27 ENCOUNTER — Ambulatory Visit: Payer: PPO | Admitting: Dermatology

## 2020-12-27 DIAGNOSIS — C44619 Basal cell carcinoma of skin of left upper limb, including shoulder: Secondary | ICD-10-CM

## 2020-12-27 NOTE — Patient Instructions (Signed)
Wound Care Instructions  Cleanse wound gently with soap and water once a day then pat dry with clean gauze. Apply a thing coat of Petrolatum (petroleum jelly, "Vaseline") over the wound (unless you have an allergy to this). We recommend that you use a new, sterile tube of Vaseline. Do not pick or remove scabs. Do not remove the yellow or white "healing tissue" from the base of the wound.  Cover the wound with fresh, clean, nonstick gauze and secure with paper tape. You may use Band-Aids in place of gauze and tape if the would is small enough, but would recommend trimming much of the tape off as there is often too much. Sometimes Band-Aids can irritate the skin.  You should call the office for your biopsy report after 1 week if you have not already been contacted.  If you experience any problems, such as abnormal amounts of bleeding, swelling, significant bruising, significant pain, or evidence of infection, please call the office immediately.  FOR ADULT SURGERY PATIENTS: If you need something for pain relief you may take 1 extra strength Tylenol (acetaminophen) AND 2 Ibuprofen (200mg each) together every 4 hours as needed for pain. (do not take these if you are allergic to them or if you have a reason you should not take them.) Typically, you may only need pain medication for 1 to 3 days.   If you have any questions or concerns for your doctor, please call our main line at [phone] and press option 4 to reach your doctor's medical assistant. If no one answers, please leave a voicemail as directed and we will return your call as soon as possible. Messages left after 4 pm will be answered the following business day.   You may also send us a message via MyChart. We typically respond to MyChart messages within 1-2 business days.  For prescription refills, please ask your pharmacy to contact our office. Our fax number is [phone].  If you have an urgent issue when the clinic is closed that  cannot wait until the next business day, you can page your doctor at the number below.    Please note that while we do our best to be available for urgent issues outside of office hours, we are not available 24/7.   If you have an urgent issue and are unable to reach us, you may choose to seek medical care at your doctor's office, retail clinic, urgent care center, or emergency room.  If you have a medical emergency, please immediately call 911 or go to the emergency department.  Pager Numbers  - Dr. Kowalski: [phone]  - Dr. Moye: [phone]  - Dr. Stewart: [phone]  In the event of inclement weather, please call our main line at [phone] for an update on the status of any delays or closures.  Dermatology Medication Tips: Please keep the boxes that topical medications come in in order to help keep track of the instructions about where and how to use these. Pharmacies typically print the medication instructions only on the boxes and not directly on the medication tubes.   If your medication is too expensive, please contact our office at [phone] option 4 or send us a message through MyChart.   We are unable to tell what your co-pay for medications will be in advance as this is different depending on your insurance coverage. However, we may be able to find a substitute medication at lower cost or fill out paperwork to get insurance to cover a needed   medication.   If a prior authorization is required to get your medication covered by your insurance company, please allow us 1-2 business days to complete this process.  Drug prices often vary depending on where the prescription is filled and some pharmacies may offer cheaper prices.  The website www.goodrx.com contains coupons for medications through different pharmacies. The prices here do not account for what the cost may be with help from insurance (it may be cheaper with your insurance), but the website can give you the  price if you did not use any insurance.  - You can print the associated coupon and take it with your prescription to the pharmacy.  - You may also stop by our office during regular business hours and pick up a GoodRx coupon card.  - If you need your prescription sent electronically to a different pharmacy, notify our office through [facility] MyChart or by phone at [phone] option 4.   

## 2020-12-27 NOTE — Progress Notes (Signed)
   Follow-Up Visit   Subjective  Aaron Brown. is a 75 y.o. male who presents for the following: Procedure (Patient here today for excision of bx proven BCC at left mid forearm. ). Recurrent post EDC.    The following portions of the chart were reviewed this encounter and updated as appropriate:        Review of Systems:  No other skin or systemic complaints except as noted in HPI or Assessment and Plan.  Objective  Well appearing patient in no apparent distress; mood and affect are within normal limits.  A focused examination was performed including left arm. Relevant physical exam findings are noted in the Assessment and Plan.  Left Mid Forearm Healing biopsy site   Assessment & Plan  Basal cell carcinoma (BCC) of skin of left upper extremity including shoulder Left Mid Forearm  Skin excision  Lesion length (cm):  0.8 Lesion width (cm):  0.8 Margin per side (cm):  0.2 Total excision diameter (cm):  1.2 Informed consent: discussed and consent obtained   Timeout: patient name, date of birth, surgical site, and procedure verified   Procedure prep:  Patient was prepped and draped in usual sterile fashion Prep type:  Povidone-iodine Anesthesia: the lesion was anesthetized in a standard fashion   Anesthetic:  1% lidocaine w/ epinephrine 1-100,000 buffered w/ 8.4% NaHCO3 (3cc lido w/epi, 9cc 0.5% bupivicaine) Instrument used: #15 blade   Hemostasis achieved with: pressure and electrodesiccation   Outcome: patient tolerated procedure well with no complications   Additional details:  Tag at 9 o'clock medial  Skin repair Complexity:  Intermediate Final length (cm):  4.3 Informed consent: discussed and consent obtained   Timeout: patient name, date of birth, surgical site, and procedure verified   Reason for type of repair: reduce tension to allow closure, reduce the risk of dehiscence, infection, and necrosis, reduce subcutaneous dead space and avoid a hematoma,  preserve normal anatomical and functional relationships and enhance both functionality and cosmetic results   Undermining: edges undermined   Subcutaneous layers (deep stitches):  Suture size:  4-0 Suture type: Vicryl (polyglactin 910)   Subcutaneous suture technique: inverted dermal. Fine/surface layer approximation (top stitches):  Suture size:  4-0 Suture type: nylon   Stitches: simple interrupted   Suture removal (days):  7 Hemostasis achieved with: suture Outcome: patient tolerated procedure well with no complications   Post-procedure details: sterile dressing applied and wound care instructions given   Dressing type: pressure dressing (Mupirocin ointment)    Specimen 1 - Surgical pathology Differential Diagnosis: r/o residual BCC Check Margins: yes Healing biopsy site [phone] Tag at 9 o'clock medial   [phone]  Return in about 1 week (around 01/03/2021) for Suture Removal.  Graciella Belton, RMA, am acting as scribe for Aaron Patty, MD .  Documentation: I have reviewed the above documentation for accuracy and completeness, and I agree with the above.  Aaron Patty MD

## 2020-12-27 NOTE — Progress Notes (Deleted)
Isotretinoin Follow-Up Visit   Subjective  Aaron Brown. is a 75 y.o. male who presents for the following: Procedure (Patient here today for excision of bx proven BCC at left mid forearm. ).  Week # ***    Side effects: Dry skin, dry lips  Denies changes in night vision, shortness of breath, abdominal pain, nausea, vomiting, diarrhea, blood in stool or urine, visual changes, headaches, epistaxis, joint pain, myalgias, mood changes, depression, or suicidal ideation.   The following portions of the chart were reviewed this encounter and updated as appropriate: medications, allergies, medical history  Review of Systems:  No other skin or systemic complaints except as noted in HPI or Assessment and Plan.  Objective  Well appearing patient in no apparent distress; mood and affect are within normal limits.  An examination of the face, neck, chest, and back was performed and relevant findings are noted below.   Left Mid Forearm Healing biopsy site   Assessment & Plan   Basal cell carcinoma (BCC) of skin of left upper extremity including shoulder Left Mid Forearm  Skin excision  Lesion length (cm):  0.8 Lesion width (cm):  0.8 Margin per side (cm):  0.2 Total excision diameter (cm):  1.2 Informed consent: discussed and consent obtained   Timeout: patient name, date of birth, surgical site, and procedure verified   Procedure prep:  Patient was prepped and draped in usual sterile fashion Prep type:  Povidone-iodine Anesthesia: the lesion was anesthetized in a standard fashion   Anesthetic:  1% lidocaine w/ epinephrine 1-100,000 buffered w/ 8.4% NaHCO3 (3cc lido w/epi, 9cc 0.5% bupivicaine) Instrument used: #15 blade   Hemostasis achieved with: pressure and electrodesiccation   Outcome: patient tolerated procedure well with no complications   Additional details:  Tag at 9 o'clock medial  Skin repair Complexity:  Intermediate Informed consent: discussed and consent  obtained   Timeout: patient name, date of birth, surgical site, and procedure verified   Reason for type of repair: reduce tension to allow closure, reduce the risk of dehiscence, infection, and necrosis, reduce subcutaneous dead space and avoid a hematoma, preserve normal anatomical and functional relationships and enhance both functionality and cosmetic results   Undermining: edges undermined   Subcutaneous layers (deep stitches):  Suture size:  4-0 Suture type: Vicryl (polyglactin 910)   Subcutaneous suture technique: inverted dermal. Fine/surface layer approximation (top stitches):  Suture size:  4-0 Suture type: nylon   Suture type comment:  Nylon Stitches: simple interrupted   Suture removal (days):  7 Hemostasis achieved with: suture Outcome: patient tolerated procedure well with no complications   Post-procedure details: sterile dressing applied and wound care instructions given   Dressing type: pressure dressing (Mupirocin ointment)    Specimen 1 - Surgical pathology Differential Diagnosis: r/o residual BCC Check Margins: yes Healing biopsy site [phone] Tag at 9 o'clock medial   [id number]   Xerosis secondary to isotretinoin therapy - Continue emollients as directed  Cheilitis secondary to isotretinoin therapy - Continue lip balm as directed, Dr. Luvenia Heller Cortibalm recommended  Long term medication management (isotretinoin) - While taking Isotretinoin and for 30 days after you finish the medication, do not share pills, do not donate blood. Isotretinoin is best absorbed when taken with a fatty meal. Isotretinoin can make you sensitive to the sun. Daily careful sun protection including sunscreen SPF 30+ when outdoors is recommended.  Follow-up in 30 days.      Follow-Up Visit   Subjective  Aaron Brown.  is a 75 y.o. male who presents for the following: Procedure (Patient here today for excision of bx proven BCC at left mid forearm. ).    The following  portions of the chart were reviewed this encounter and updated as appropriate:        Review of Systems:  No other skin or systemic complaints except as noted in HPI or Assessment and Plan.  Objective  Well appearing patient in no apparent distress; mood and affect are within normal limits.  A focused examination was performed including left forearm. Relevant physical exam findings are noted in the Assessment and Plan.  Left Mid Forearm Healing biopsy site   Assessment & Plan  Basal cell carcinoma (BCC) of skin of left upper extremity including shoulder Left Mid Forearm  Skin excision  Lesion length (cm):  0.8 Lesion width (cm):  0.8 Margin per side (cm):  0.2 Total excision diameter (cm):  1.2 Informed consent: discussed and consent obtained   Timeout: patient name, date of birth, surgical site, and procedure verified   Procedure prep:  Patient was prepped and draped in usual sterile fashion Prep type:  Povidone-iodine Anesthesia: the lesion was anesthetized in a standard fashion   Anesthetic:  1% lidocaine w/ epinephrine 1-100,000 buffered w/ 8.4% NaHCO3 (3cc lido w/epi, 9cc 0.5% bupivicaine) Instrument used: #15 blade   Hemostasis achieved with: pressure and electrodesiccation   Outcome: patient tolerated procedure well with no complications   Additional details:  Tag at 9 o'clock medial  Skin repair Complexity:  Intermediate Informed consent: discussed and consent obtained   Timeout: patient name, date of birth, surgical site, and procedure verified   Reason for type of repair: reduce tension to allow closure, reduce the risk of dehiscence, infection, and necrosis, reduce subcutaneous dead space and avoid a hematoma, preserve normal anatomical and functional relationships and enhance both functionality and cosmetic results   Undermining: edges undermined   Subcutaneous layers (deep stitches):  Suture size:  4-0 Suture type: Vicryl (polyglactin 910)   Subcutaneous  suture technique: inverted dermal. Fine/surface layer approximation (top stitches):  Suture size:  4-0 Suture type: nylon   Suture type comment:  Nylon Stitches: simple interrupted   Suture removal (days):  7 Hemostasis achieved with: suture Outcome: patient tolerated procedure well with no complications   Post-procedure details: sterile dressing applied and wound care instructions given   Dressing type: pressure dressing (Mupirocin ointment)    Specimen 1 - Surgical pathology Differential Diagnosis: r/o residual BCC Check Margins: yes Healing biopsy site [phone] Tag at 9 o'clock medial   [phone]  Return in about 1 week (around 01/03/2021) for Suture Removal.  Graciella Belton, RMA, am acting as scribe for Brendolyn Patty, MD .

## 2020-12-28 ENCOUNTER — Telehealth: Payer: Self-pay

## 2020-12-28 NOTE — Telephone Encounter (Signed)
Patient doing well after yesterday's surgery. He advises there is minimal bleeding and will call before Friday if worsens, JS

## 2021-01-04 ENCOUNTER — Other Ambulatory Visit: Payer: Self-pay

## 2021-01-04 ENCOUNTER — Ambulatory Visit: Payer: PPO

## 2021-01-04 DIAGNOSIS — Z4802 Encounter for removal of sutures: Secondary | ICD-10-CM

## 2021-01-04 NOTE — Patient Instructions (Signed)

## 2021-01-04 NOTE — Progress Notes (Signed)
   Follow-Up Visit   Subjective  Adriaan Maltese. is a 75 y.o. male who presents for the following: Suture / Staple Removal (Suture removal for excision of biopsy proven BCC on left mid forearm. Surgical path still pending. ).    The following portions of the chart were reviewed this encounter and updated as appropriate:       Objective  Well appearing patient in no apparent distress; mood and affect are within normal limits.    left mid forearm Suture removal for biopsy proven BCC  Incision site is clean, dry and intact   Assessment & Plan  Encounter for removal of sutures left mid forearm  Encounter for Removal of Sutures - Incision site at the left mid forearm is clean, dry and intact - Wound cleansed, sutures removed, wound cleansed and steri strips applied.  - Discussed pathology results showing pending.   - Patient advised to keep steri-strips dry until they fall off. - Scars remodel for a full year. - Once steri-strips fall off, patient can apply over-the-counter silicone scar cream each night to help with scar remodeling if desired. - Patient advised to call with any concerns or if they notice any new or changing lesions.   Return in about 6 months (around 07/07/2021).  I, Harriett Sine, CMA, am acting as scribe for [employer].

## 2021-01-10 ENCOUNTER — Telehealth: Payer: Self-pay

## 2021-01-10 NOTE — Telephone Encounter (Signed)
Patient called and advised of results. No further questions at this time.

## 2021-01-10 NOTE — Telephone Encounter (Signed)
-----   Message from Brendolyn Patty, MD sent at 01/10/2021 10:46 AM EDT ----- Skin (M), left mid forearm EXCISION, RESIDUAL BASAL CELL CARCINOMA, MARGINS FREE

## 2021-03-10 ENCOUNTER — Other Ambulatory Visit: Payer: PPO

## 2021-03-10 ENCOUNTER — Other Ambulatory Visit: Payer: Self-pay

## 2021-03-10 DIAGNOSIS — C61 Malignant neoplasm of prostate: Secondary | ICD-10-CM

## 2021-03-11 LAB — PSA: Prostate Specific Ag, Serum: 4.8 ng/mL — ABNORMAL HIGH (ref 0.0–4.0)

## 2021-03-16 ENCOUNTER — Ambulatory Visit: Payer: Self-pay | Admitting: Urology

## 2021-03-18 ENCOUNTER — Other Ambulatory Visit: Payer: PPO

## 2021-03-18 ENCOUNTER — Ambulatory Visit: Payer: PPO

## 2021-03-20 NOTE — Progress Notes (Signed)
03/21/2021 10:03 AM   [name] 1945-09-29 [id number]  Referring provider: Ria Bush, MD [address] [place],  [place] [address]  Chief Complaint  Patient presents with   Follow-up    Urologic history: 1.  Prostate cancer T1c low risk Fusion biopsy 08/2020 PI-RADS 4/PI-RADS 3 lesions; 46 g gland PSA 5.1 ROI lesions benign Gleason 3+3 adenocarcinoma RLM, RA, RLA (5%, 20%, 5%)   HPI: 75 y.o. male presents for 91-monthfollow-up.  Doing well since last visit No bothersome LUTS Denies dysuria, gross hematuria Denies flank, abdominal or pelvic pain PSA 03/10/2021 stable at 4.8  PMH: Past Medical History:  Diagnosis Date   Actinic keratosis    Asymmetrical left sensorineural hearing loss 12/2013   s/p MRI pending hearing aides ([name]   Basal cell carcinoma 10/21/2019   L ala crease   Basal cell carcinoma 11/11/2018   R chin/excision   Basal cell carcinoma 03/12/2018   L mid forearm   Basal cell carcinoma 05/30/2016   R inf clavicle, L lateral canthus   Basal cell carcinoma 01/06/2014   L elbow   Basal cell carcinoma 11/02/2020   L mid forearm, recurrent. Exc 12/27/2020.   Blood in stool    in the past; ? hemorrhoids   Cerebral microvascular disease 12/2013   by MRI, referred to neuro by ENT   Colon polyp 05/2005   ?hyperplastic   History of chicken pox    Hypertension    Increased pressure in the eye    Dr. BGloriann Loan  Lower back pain    bulging disc with spinal stenosis L4/5 s/p surgery x2   Prediabetes    A1c 6.0 (2012)   Seasonal allergies    Squamous acanthoma of face    Squamous cell cancer of skin of hand 10/22/2018   R hand dorsum below first webspace   Squamous cell carcinoma of neck 06/26/2013   Right anterior neck/excision   Squamous cell carcinoma of skin 11/17/2019   Left dorsal hand. SCCis hypertrophic.    Surgical History: Past Surgical History:  Procedure Laterality Date   BUNIONECTOMY  2003   Dr. PBarkley Bruns   COLONOSCOPY  05/2005   1 polyp, int hem    DOBUTAMINE STRESS ECHO  2013   normal per prior PCP records   hemi-laminectomy  2000   Dr. PAnnette Stable Dr. KHal NeerL4/L5   LAMINECTOMY  1988   Dr. CPearlie Oyster L5   SQUAMOUS CELL CARCINOMA EXCISION  2014   face and hand    Home Medications:  Allergies as of 03/21/2021       Reactions   Lisinopril    May have caused lip swelling        Medication List        Accurate as of March 21, 2021 10:03 AM. If you have any questions, ask your nurse or doctor.          amLODipine 5 MG tablet Commonly known as: NORVASC TAKE 1 TABLET BY MOUTH EVERY DAY   beta carotene w/minerals tablet Take 1 tablet by mouth daily.   calcipotriene 0.005 % cream Commonly known as: DOVONOX Apply twice a day everyday for 10-14 days to affected areas.   cholecalciferol 1000 units tablet Commonly known as: VITAMIN D Take 1,000 Units by mouth daily.   etodolac 400 MG tablet Commonly known as: LODINE TAKE 1 TABLET (400 MG TOTAL) BY MOUTH 2 (TWO) TIMES DAILY AS NEEDED.   fluorouracil 5 % cream Commonly known  as: EFUDEX Apply twice a day everyday for 10-14 days to affected areas.   hydrochlorothiazide 25 MG tablet Commonly known as: HYDRODIURIL Take 1 tablet (25 mg total) by mouth daily.   losartan 25 MG tablet Commonly known as: COZAAR Take 1 tablet (25 mg total) by mouth daily.   LUTEIN PO Take by mouth.   LUTEIN-ZEAXANTHIN PO Take by mouth.        Allergies:  Allergies  Allergen Reactions   Lisinopril     May have caused lip swelling    Family History: Family History  Problem Relation Age of Onset   Hypertension Mother    Arthritis Father    Hypertension Father    Alcohol abuse Maternal Grandfather    Cancer Maternal Aunt        colon   Cancer Cousin        colon   Cancer Cousin        breast    Social History:  reports that he has never smoked. He has never used smokeless tobacco. He reports current alcohol use. He  reports that he does not use drugs.   Physical Exam: BP (!) 149/72   Pulse 90   Ht '5\' 8"'$  (1.727 m)   Wt 165 lb (74.8 kg)   BMI 25.09 kg/m   Constitutional:  Alert and oriented, No acute distress. HEENT: [place] AT, moist mucus membranes.  Trachea midline, no masses. Cardiovascular: No clubbing, cyanosis, or edema. Respiratory: Normal respiratory effort, no increased work of breathing. GI: Abdomen is soft, nontender, nondistended, no abdominal masses   Assessment & Plan:    1.  T1c low risk prostate cancer Stable PSA 48-monthfollow-up PSA/DRE Discussed follow-up MRI and confirmatory biopsy  SAbbie Sons MD  [id number] [place] [address]([phone]

## 2021-03-21 ENCOUNTER — Encounter: Payer: Self-pay | Admitting: Urology

## 2021-03-21 ENCOUNTER — Ambulatory Visit: Payer: PPO | Admitting: Urology

## 2021-03-21 ENCOUNTER — Other Ambulatory Visit: Payer: Self-pay

## 2021-03-21 VITALS — BP 149/72 | HR 90 | Ht 68.0 in | Wt 165.0 lb

## 2021-03-21 DIAGNOSIS — R350 Frequency of micturition: Secondary | ICD-10-CM | POA: Diagnosis not present

## 2021-03-21 DIAGNOSIS — C61 Malignant neoplasm of prostate: Secondary | ICD-10-CM | POA: Diagnosis not present

## 2021-03-21 DIAGNOSIS — N401 Enlarged prostate with lower urinary tract symptoms: Secondary | ICD-10-CM

## 2021-03-22 ENCOUNTER — Encounter: Payer: PPO | Admitting: Family Medicine

## 2021-03-26 ENCOUNTER — Other Ambulatory Visit: Payer: Self-pay | Admitting: Family Medicine

## 2021-04-02 IMAGING — DX DG LUMBAR SPINE COMPLETE 4+V
2 series · 2 of 2 positions shown · non-contrast
Comparison: Lumbar radiographs 08/31/2014, MR Ruha lumbar spine
11/02/2014

CLINICAL DATA: Low back pain, 5 weeks duration, left radiculopathy

EXAM:
LUMBAR SPINE - COMPLETE 4+ VIEW

[l-spine ap]
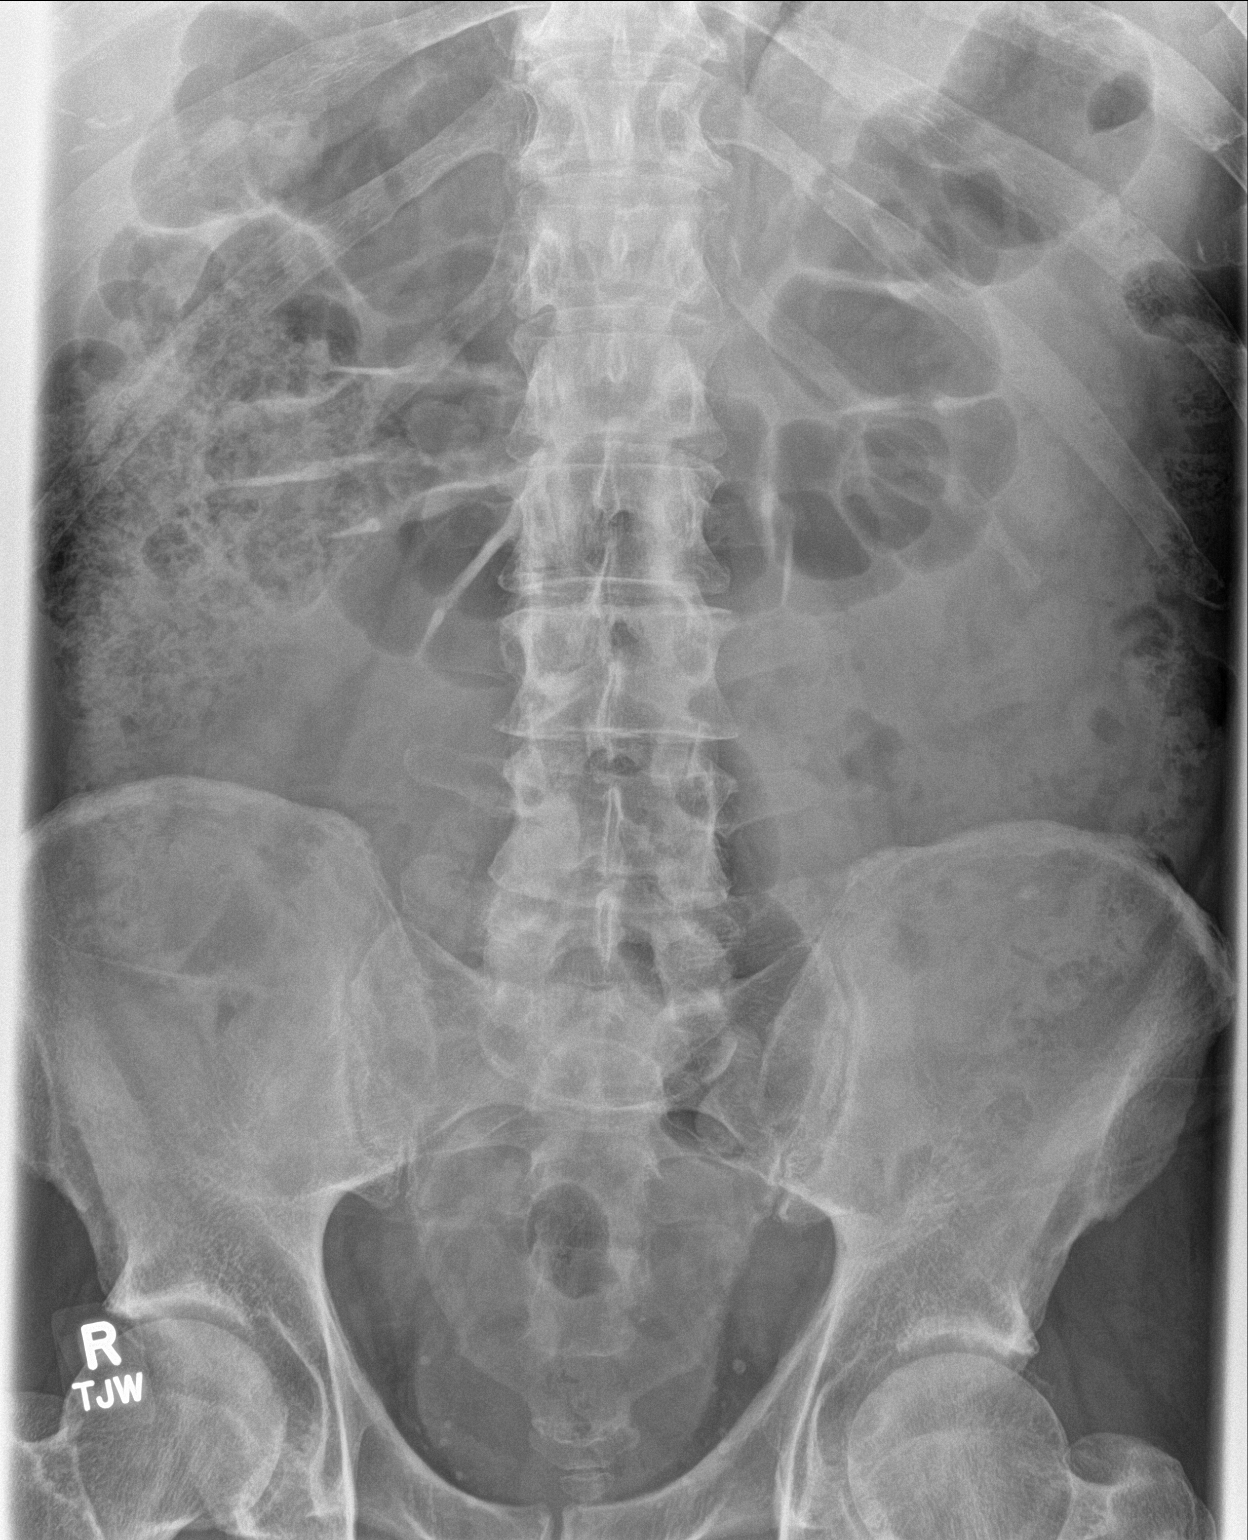

[l-spine obl]
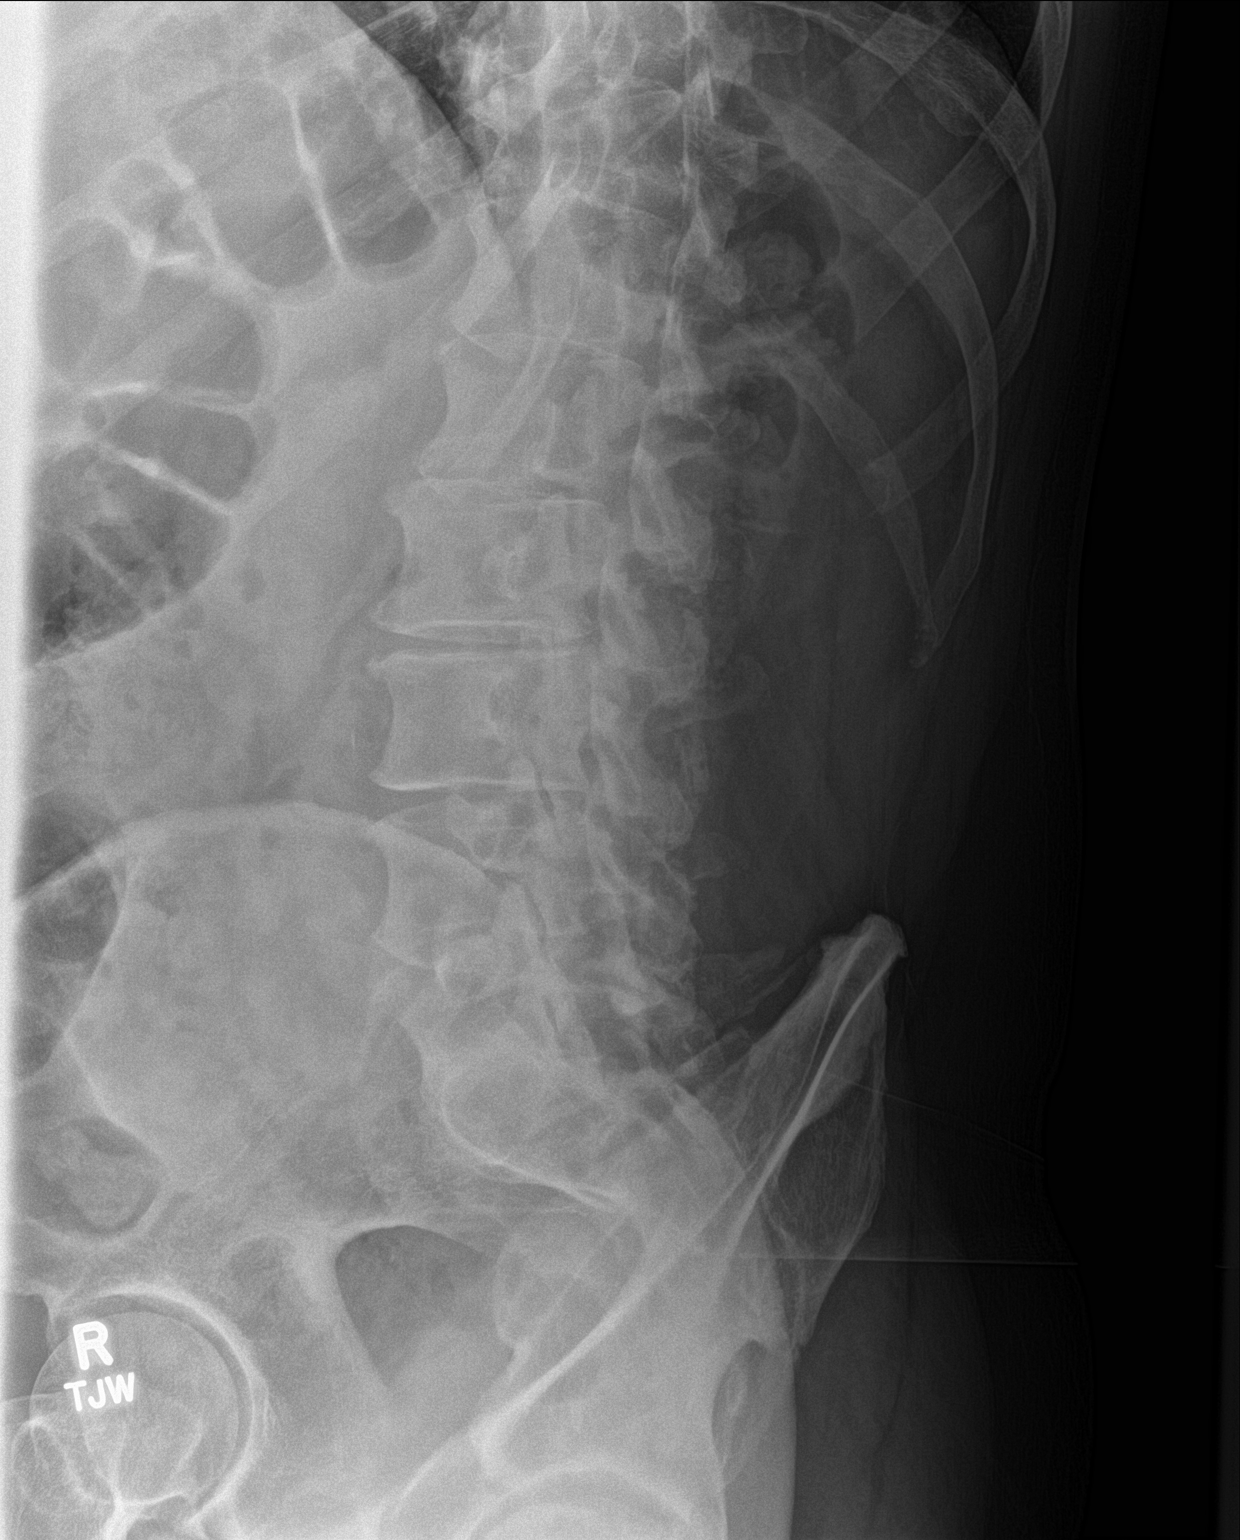

[2 of 2 positions shown; findings below may reference images not displayed]

FINDINGS: Five lumbar type vertebral bodies. No acute fracture or vertebral
body height loss. Redemonstrated postsurgical changes from prior
laminectomy at L4 and L5 better seen on prior cross-sectional
imaging. No spondylolysis or spondylolisthesis is evident.
Multilevel discogenic and facet degenerative changes are
redemonstrated. No severe canal stenosis. Fragmentation adjacent to
the tip of the L3 vertebral level is unchanged from prior.
Degenerative changes noted in both SI joints. Bowel gas pattern is
unremarkable. Remaining soft tissues are free of abnormality.
IMPRESSION: 1. Stable postsurgical changes from prior laminectomy at L4 and L5.
No acute osseous abnormality.
2. Multilevel degenerative changes of the lumbar spine. No severe
canal stenosis.

## 2021-04-16 ENCOUNTER — Other Ambulatory Visit: Payer: Self-pay | Admitting: Family Medicine

## 2021-04-21 ENCOUNTER — Other Ambulatory Visit: Payer: Self-pay | Admitting: Family Medicine

## 2021-05-02 NOTE — Progress Notes (Signed)
Subjective:   Aaron Brown. is a 75 y.o. male who presents for Medicare Annual/Subsequent preventive examination.  I connected with Aaron Brown. today by telephone and verified that I am speaking with the correct person using two identifiers. Location patient: home Location provider: work Persons participating in the virtual visit: patient, Marine scientist.    I discussed the limitations, risks, security and privacy concerns of performing an evaluation and management service by telephone and the availability of in person appointments. I also discussed with the patient that there may be a patient responsible charge related to this service. The patient expressed understanding and verbally consented to this telephonic visit.    Interactive audio and video telecommunications were attempted between this provider and patient, however failed, due to patient having technical difficulties OR patient did not have access to video capability.  We continued and completed visit with audio only.  Some vital signs may be absent or patient reported.   Time Spent with patient on telephone encounter: 25 minutes   Review of Systems     Cardiac Risk Factors include: advanced age (>33men, >17 women);hypertension     Objective:    Today's Vitals   05/04/21 0944  Weight: 170 lb (77.1 kg)  Height: 5\' 8"  (1.727 m)   Body mass index is 25.85 kg/m.  Advanced Directives 05/04/2021 03/16/2020 06/21/2018 01/07/2018 12/16/2015  Does Patient Have a Medical Advance Directive? Yes Yes No No;Yes Yes  Type of Paramedic of [place];Living will [place];Living will - [place];Living will [place]  Does patient want to make changes to medical advance directive? Yes (MAU/Ambulatory/Procedural Areas - Information given) - - - No - Patient declined  Copy of [place] in Chart? Yes - validated most recent copy scanned  in chart (See row information) Yes - validated most recent copy scanned in chart (See row information) - Yes No - copy requested  Would patient like information on creating a medical advance directive? - - No - Patient declined No - Patient declined -    Current Medications (verified) Outpatient Encounter Medications as of 05/04/2021  Medication Sig   amLODipine (NORVASC) 5 MG tablet TAKE 1 TABLET BY MOUTH EVERY DAY   ASTAXANTHIN PO Take 12 mg by mouth daily.   beta carotene w/minerals (OCUVITE) tablet Take 1 tablet by mouth daily.   calcipotriene (DOVONOX) 0.005 % cream Apply twice a day everyday for 10-14 days to affected areas.   cholecalciferol (VITAMIN D) 1000 UNITS tablet Take 1,000 Units by mouth daily.   etodolac (LODINE) 400 MG tablet TAKE 1 TABLET (400 MG TOTAL) BY MOUTH 2 (TWO) TIMES DAILY AS NEEDED.   fluorouracil (EFUDEX) 5 % cream Apply twice a day everyday for 10-14 days to affected areas.   hydrochlorothiazide (HYDRODIURIL) 25 MG tablet TAKE 1 TABLET BY MOUTH EVERY DAY   losartan (COZAAR) 25 MG tablet TAKE 1 TABLET BY MOUTH EVERY DAY   LUTEIN PO Take by mouth.   Multiple Vitamins-Minerals (LUTEIN-ZEAXANTHIN PO) Take by mouth.   No facility-administered encounter medications on file as of 05/04/2021.    Allergies (verified) Lisinopril   History: Past Medical History:  Diagnosis Date   Actinic keratosis    Asymmetrical left sensorineural hearing loss 12/2013   s/p MRI pending hearing aides [name])   Basal cell carcinoma 10/21/2019   L ala crease   Basal cell carcinoma 11/11/2018   R chin/excision   Basal cell carcinoma  03/12/2018   L mid forearm   Basal cell carcinoma 05/30/2016   R inf clavicle, L lateral canthus   Basal cell carcinoma 01/06/2014   L elbow   Basal cell carcinoma 11/02/2020   L mid forearm, recurrent. Exc 12/27/2020.   Blood in stool    in the past; ? hemorrhoids   Cerebral microvascular disease 12/2013   by MRI, referred to neuro by ENT    Colon polyp 05/2005   ?hyperplastic   History of chicken pox    Hypertension    Increased pressure in the eye    Dr. Gloriann Loan   Lower back pain    bulging disc with spinal stenosis L4/5 s/p surgery x2   Prediabetes    A1c 6.0 (2012)   Seasonal allergies    Squamous acanthoma of face    Squamous cell cancer of skin of hand 10/22/2018   R hand dorsum below first webspace   Squamous cell carcinoma of neck 06/26/2013   Right anterior neck/excision   Squamous cell carcinoma of skin 11/17/2019   Left dorsal hand. SCCis hypertrophic.   Past Surgical History:  Procedure Laterality Date   BUNIONECTOMY  2003   Dr. Barkley Bruns   COLONOSCOPY  05/2005   1 polyp, int hem    DOBUTAMINE STRESS ECHO  2013   normal per prior PCP records   hemi-laminectomy  2000   Dr. Annette Stable, Dr. Hal Neer L4/L5   LAMINECTOMY  1988   Dr. Pearlie Oyster; L5   SQUAMOUS CELL CARCINOMA EXCISION  2014   face and hand   Family History  Problem Relation Age of Onset   Hypertension Mother    Arthritis Father    Hypertension Father    Alcohol abuse Maternal Grandfather    Cancer Maternal Aunt        colon   Cancer Cousin        colon   Cancer Cousin        breast   Social History   Socioeconomic History   Marital status: Married    Spouse name: Not on file   Number of children: Not on file   Years of education: Not on file   Highest education level: Not on file  Occupational History   Not on file  Tobacco Use   Smoking status: Never   Smokeless tobacco: Never  Vaping Use   Vaping Use: Never used  Substance and Sexual Activity   Alcohol use: Yes    Alcohol/week: 0.0 standard drinks    Comment: Occasional-wine   Drug use: No   Sexual activity: Yes  Other Topics Concern   Not on file  Social History Narrative   Lives with wife   grown children   Occupation: was Government social research officer, now owns landscaping business   Edu: Sports administrator   Activity: stays active at work   Diet: good water, fruits/vegetables some    Social Determinants of Radio broadcast assistant Strain: Low Risk    Difficulty of Paying Living Expenses: Not hard at all  Food Insecurity: No Food Insecurity   Worried About Charity fundraiser in the Last Year: Never true   Arboriculturist in the Last Year: Never true  Transportation Needs: No Transportation Needs   Lack of Transportation (Medical): No   Lack of Transportation (Non-Medical): No  Physical Activity: Sufficiently Active   Days of Exercise per Week: 7 days   Minutes of Exercise per Session: 40 min  Stress: No Stress Concern  Present   Feeling of Stress : Not at all  Social Connections: Socially Integrated   Frequency of Communication with Friends and Family: Three times a week   Frequency of Social Gatherings with Friends and Family: Once a week   Attends Religious Services: More than 4 times per year   Active Member of [employer] or Organizations: Yes   Attends Music therapist: More than 4 times per year   Marital Status: Married    Tobacco Counseling Counseling given: Not Answered   Clinical Intake:  Pre-visit preparation completed: Yes  Pain : No/denies pain     BMI - recorded: 25.8 Nutritional Status: BMI 25 -29 Overweight Nutritional Risks: None Diabetes: No  How often do you need to have someone help you when you read instructions, pamphlets, or other written materials from your doctor or pharmacy?: 1 - Never  Diabetic?No  Interpreter Needed?: No  Information entered by :: Orrin Brigham LPN   Activities of Daily Living In your present state of health, do you have any difficulty performing the following activities: 05/04/2021  Hearing? Y  Comment has hearing aids  Vision? N  Difficulty concentrating or making decisions? N  Walking or climbing stairs? N  Dressing or bathing? N  Doing errands, shopping? N  Preparing Food and eating ? N  Using the Toilet? N  In the past six months, have you accidently leaked urine? N  Do you  have problems with loss of bowel control? N  Managing your Medications? N  Managing your Finances? N  Housekeeping or managing your Housekeeping? N  Some recent data might be hidden    Patient Care Team: Ria Bush, MD as PCP - General (Family Medicine) Lorelee Cover., MD as Referring Physician (Ophthalmology) Eugenie Birks Paschal Dopp., MD as Referring Physician (Dentistry) Brendolyn Patty, MD as Referring Physician (Dermatology) Garrel Ridgel, DPM as Consulting Physician (Podiatry)  Indicate any recent Medical Services you may have received from other than [facility] providers in the past year (date may be approximate).     Assessment:   This is a routine wellness examination for [employer].  Hearing/Vision screen Hearing Screening - Comments:: Has hearing aids for both ears Vision Screening - Comments:: Last exam 2022, Dr. Gloriann Loan, wears glasses  Dietary issues and exercise activities discussed: Current Exercise Habits: Home exercise routine, Type of exercise: strength training/weights;stretching, Time (Minutes): 45, Frequency (Times/Week): 7, Weekly Exercise (Minutes/Week): 315, Intensity: Moderate   Goals Addressed             This Visit's Progress    Patient Stated       Would like to lose weight.        Depression Screen PHQ 2/9 Scores 05/04/2021 03/16/2020 01/27/2019 01/07/2018 01/02/2017 12/16/2015 12/14/2014  PHQ - 2 Score 0 0 0 0 0 0 0  PHQ- 9 Score - 0 - 0 - - -    Fall Risk Fall Risk  05/04/2021 03/16/2020 01/27/2020 05/28/2019 01/27/2019  Falls in the past year? 1 0 0 0 0  Comment - - Emmi Telephone Survey: data to providers prior to load Emmi Telephone Survey: data to providers prior to load -  Number falls in past yr: 0 0 - - -  Injury with Fall? 0 0 - - -  Risk for fall due to : No Fall Risks Medication side effect - - -  Follow up Falls prevention discussed Falls evaluation completed;Falls prevention discussed - - -    FALL RISK PREVENTION PERTAINING TO THE  HOME:  Any stairs in or around the home? Yes  If so, are there any without handrails? No  Home free of loose throw rugs in walkways, pet beds, electrical cords, etc? Yes  Adequate lighting in your home to reduce risk of falls? Yes   ASSISTIVE DEVICES UTILIZED TO PREVENT FALLS:  Life alert? No  Use of a cane, walker or w/c? No  Grab bars in the bathroom? Yes  Shower chair or bench in shower? No Elevated toilet seat or a handicapped toilet? Yes   TIMED UP AND GO:  Was the test performed? No , visit completed over the phone.   Cognitive Function: Normal cognitive status assessed by this Nurse Health Advisor. No abnormalities found.   MMSE - Mini Mental State Exam 03/16/2020 01/07/2018 12/16/2015  Orientation to time 5 5 5   Orientation to Place 5 5 5   Registration 3 3 3   Attention/ Calculation 5 0 0  Recall 3 3 3   Language- name 2 objects - 0 0  Language- repeat 1 1 1   Language- follow 3 step command - 3 3  Language- read & follow direction - 0 0  Write a sentence - 0 0  Copy design - 0 0  Total score - 20 20        Immunizations Immunization History  Administered Date(s) Administered   Fluad Quad(high Dose 65+) 03/31/2019, 03/19/2020   Influenza,inj,Quad PF,6+ Mos 08/02/2016, 08/26/2018   PFIZER(Purple Top)SARS-COV-2 Vaccination 10/02/2019, 10/23/2019   Pneumococcal Conjugate-13 12/03/2013   Pneumococcal Polysaccharide-23 11/11/2012   Tdap 12/27/2011   Zoster, Live 07/04/2011    TDAP status: Up to date  Flu Vaccine status: Due, Education has been provided regarding the importance of this vaccine. Advised may receive this vaccine at local pharmacy or Health Dept. Aware to provide a copy of the vaccination record if obtained from local pharmacy or Health Dept. Verbalized acceptance and understanding.  Pneumococcal vaccine status: Up to date  Covid-19 vaccine status: Declined, Education has been provided regarding the importance of this vaccine but patient still  declined. Advised may receive this vaccine at local pharmacy or Health Dept.or vaccine clinic. Aware to provide a copy of the vaccination record if obtained from local pharmacy or Health Dept. Verbalized acceptance and understanding.  Qualifies for Shingles Vaccine? Yes   Zostavax completed Yes   Shingrix Completed?: No.    Education has been provided regarding the importance of this vaccine. Patient has been advised to call insurance company to determine out of pocket expense if they have not yet received this vaccine. Advised may also receive vaccine at local pharmacy or Health Dept. Verbalized acceptance and understanding.  Screening Tests Health Maintenance  Topic Date Due   Zoster Vaccines- Shingrix (1 of 2) Never done   COVID-19 Vaccine (3 - Pfizer risk series) 11/20/2019   INFLUENZA VACCINE  01/31/2021   TETANUS/TDAP  12/26/2021   Fecal DNA (Cologuard)  04/06/2023   Pneumonia Vaccine 35+ Years old  Completed   Hepatitis C Screening  Completed   HPV VACCINES  Aged Out    Health Maintenance  Health Maintenance Due  Topic Date Due   Zoster Vaccines- Shingrix (1 of 2) Never done   COVID-19 Vaccine (3 - Pfizer risk series) 11/20/2019   INFLUENZA VACCINE  01/31/2021    Colorectal cancer screening: Type of screening Cologuard, completed 04/05/20, repeat every 3 years.  Lung Cancer Screening: (Low Dose CT Chest recommended if Age 39-80 years, 30 pack-year currently smoking OR have quit w/in 15years.)  does not qualify.     Additional Screening:  Hepatitis C Screening: does qualify; Completed 12/16/15  Vision Screening: Recommended annual ophthalmology exams for early detection of glaucoma and other disorders of the eye. Is the patient up to date with their annual eye exam?  Yes  Who is the provider or what is the name of the office in which the patient attends annual eye exams? Dr. Gloriann Loan   Dental Screening: Recommended annual dental exams for proper oral hygiene  Community  Resource Referral / Chronic Care Management: CRR required this visit?  No   CCM required this visit?  No      Plan:     I have personally reviewed and noted the following in the patient's chart:   Medical and social history Use of alcohol, tobacco or illicit drugs  Current medications and supplements including opioid prescriptions. Patient is not currently taking opioid prescriptions. Functional ability and status Nutritional status Physical activity Advanced directives List of other physicians Hospitalizations, surgeries, and ER visits in previous 12 months Vitals Screenings to include cognitive, depression, and falls Referrals and appointments  In addition, I have reviewed and discussed with patient certain preventive protocols, quality metrics, and best practice recommendations. A written personalized care plan for preventive services as well as general preventive health recommendations were provided to patient.   Due to this being a telephonic visit, the after visit summary with patients personalized plan was offered to patient via mail or my-chart. Patient would like to access on my-chart.   Loma Messing, LPN   [id number]   Nurse Health Advisor  Nurse Notes: None

## 2021-05-04 ENCOUNTER — Ambulatory Visit (INDEPENDENT_AMBULATORY_CARE_PROVIDER_SITE_OTHER): Payer: PPO

## 2021-05-04 ENCOUNTER — Other Ambulatory Visit: Payer: PPO

## 2021-05-04 VITALS — Ht 68.0 in | Wt 170.0 lb

## 2021-05-04 DIAGNOSIS — Z0000 Encounter for general adult medical examination without abnormal findings: Secondary | ICD-10-CM

## 2021-05-04 NOTE — Patient Instructions (Signed)
Aaron Brown , Thank you for taking time to complete your Medicare Wellness Visit. I appreciate your ongoing commitment to your health goals. Please review the following plan we discussed and let me know if I can assist you in the future.   Screening recommendations/referrals: Colonoscopy: cologuard-up to date, completed 04/05/20, Due 04/05/22 Recommended yearly ophthalmology/optometry visit for glaucoma screening and checkup Recommended yearly dental visit for hygiene and checkup  Vaccinations: Influenza vaccine: Due, discuss with PCP or pharmacy Pneumococcal vaccine: up to date Tdap vaccine: up to date, completed 12/27/11, due 12/26/21 Shingles vaccine: discuss with pharmacy if you change your mind   Covid-19: newest booster available at pharmacy   Advanced directives: Copy on file   Conditions/risks identified: see problem list  Next appointment: Follow up in one year for your annual wellness visit.   Preventive Care 65 Years and Older, Male Preventive care refers to lifestyle choices and visits with your health care provider that can promote health and wellness. What does preventive care include? A yearly physical exam. This is also called an annual well check. Dental exams once or twice a year. Routine eye exams. Ask your health care provider how often you should have your eyes checked. Personal lifestyle choices, including: Daily care of your teeth and gums. Regular physical activity. Eating a healthy diet. Avoiding tobacco and drug use. Limiting alcohol use. Practicing safe sex. Taking low doses of aspirin every day. Taking vitamin and mineral supplements as recommended by your health care provider. What happens during an annual well check? The services and screenings done by your health care provider during your annual well check will depend on your age, overall health, lifestyle risk factors, and family history of disease. Counseling  Your health care provider may ask you  questions about your: Alcohol use. Tobacco use. Drug use. Emotional well-being. Home and relationship well-being. Sexual activity. Eating habits. History of falls. Memory and ability to understand (cognition). Work and work Statistician. Screening  You may have the following tests or measurements: Height, weight, and BMI. Blood pressure. Lipid and cholesterol levels. These may be checked every 5 years, or more frequently if you are over 12 years old. Skin check. Lung cancer screening. You may have this screening every year starting at age 4 if you have a 30-pack-year history of smoking and currently smoke or have quit within the past 15 years. Fecal occult blood test (FOBT) of the stool. You may have this test every year starting at age 5. Flexible sigmoidoscopy or colonoscopy. You may have a sigmoidoscopy every 5 years or a colonoscopy every 10 years starting at age 37. Prostate cancer screening. Recommendations will vary depending on your family history and other risks. Hepatitis C blood test. Hepatitis B blood test. Sexually transmitted disease (STD) testing. Diabetes screening. This is done by checking your blood sugar (glucose) after you have not eaten for a while (fasting). You may have this done every 1-3 years. Abdominal aortic aneurysm (AAA) screening. You may need this if you are a current or former smoker. Osteoporosis. You may be screened starting at age 67 if you are at high risk. Talk with your health care provider about your test results, treatment options, and if necessary, the need for more tests. Vaccines  Your health care provider may recommend certain vaccines, such as: Influenza vaccine. This is recommended every year. Tetanus, diphtheria, and acellular pertussis (Tdap, Td) vaccine. You may need a Td booster every 10 years. Zoster vaccine. You may need this after age 60.  Pneumococcal 13-valent conjugate (PCV13) vaccine. One dose is recommended after age  56. Pneumococcal polysaccharide (PPSV23) vaccine. One dose is recommended after age 51. Talk to your health care provider about which screenings and vaccines you need and how often you need them. This information is not intended to replace advice given to you by your health care provider. Make sure you discuss any questions you have with your health care provider. Document Released: 07/16/2015 Document Revised: 03/08/2016 Document Reviewed: 04/20/2015 Elsevier Interactive Patient Education  2017 [place] Prevention in the Home Falls can cause injuries. They can happen to people of all ages. There are many things you can do to make your home safe and to help prevent falls. What can I do on the outside of my home? Regularly fix the edges of walkways and driveways and fix any cracks. Remove anything that might make you trip as you walk through a door, such as a raised step or threshold. Trim any bushes or trees on the path to your home. Use bright outdoor lighting. Clear any walking paths of anything that might make someone trip, such as rocks or tools. Regularly check to see if handrails are loose or broken. Make sure that both sides of any steps have handrails. Any raised decks and porches should have guardrails on the edges. Have any leaves, snow, or ice cleared regularly. Use sand or salt on walking paths during winter. Clean up any spills in your garage right away. This includes oil or grease spills. What can I do in the bathroom? Use night lights. Install grab bars by the toilet and in the tub and shower. Do not use towel bars as grab bars. Use non-skid mats or decals in the tub or shower. If you need to sit down in the shower, use a plastic, non-slip stool. Keep the floor dry. Clean up any water that spills on the floor as soon as it happens. Remove soap buildup in the tub or shower regularly. Attach bath mats securely with double-sided non-slip rug tape. Do not have throw  rugs and other things on the floor that can make you trip. What can I do in the bedroom? Use night lights. Make sure that you have a light by your bed that is easy to reach. Do not use any sheets or blankets that are too big for your bed. They should not hang down onto the floor. Have a firm chair that has side arms. You can use this for support while you get dressed. Do not have throw rugs and other things on the floor that can make you trip. What can I do in the kitchen? Clean up any spills right away. Avoid walking on wet floors. Keep items that you use a lot in easy-to-reach places. If you need to reach something above you, use a strong step stool that has a grab bar. Keep electrical cords out of the way. Do not use floor polish or wax that makes floors slippery. If you must use wax, use non-skid floor wax. Do not have throw rugs and other things on the floor that can make you trip. What can I do with my stairs? Do not leave any items on the stairs. Make sure that there are handrails on both sides of the stairs and use them. Fix handrails that are broken or loose. Make sure that handrails are as long as the stairways. Check any carpeting to make sure that it is firmly attached to the stairs. Fix any  carpet that is loose or worn. Avoid having throw rugs at the top or bottom of the stairs. If you do have throw rugs, attach them to the floor with carpet tape. Make sure that you have a light switch at the top of the stairs and the bottom of the stairs. If you do not have them, ask someone to add them for you. What else can I do to help prevent falls? Wear shoes that: Do not have high heels. Have rubber bottoms. Are comfortable and fit you well. Are closed at the toe. Do not wear sandals. If you use a stepladder: Make sure that it is fully opened. Do not climb a closed stepladder. Make sure that both sides of the stepladder are locked into place. Ask someone to hold it for you, if  possible. Clearly mark and make sure that you can see: Any grab bars or handrails. First and last steps. Where the edge of each step is. Use tools that help you move around (mobility aids) if they are needed. These include: Canes. Walkers. Scooters. Crutches. Turn on the lights when you go into a dark area. Replace any light bulbs as soon as they burn out. Set up your furniture so you have a clear path. Avoid moving your furniture around. If any of your floors are uneven, fix them. If there are any pets around you, be aware of where they are. Review your medicines with your doctor. Some medicines can make you feel dizzy. This can increase your chance of falling. Ask your doctor what other things that you can do to help prevent falls. This information is not intended to replace advice given to you by your health care provider. Make sure you discuss any questions you have with your health care provider. Document Released: 04/15/2009 Document Revised: 11/25/2015 Document Reviewed: 07/24/2014 Elsevier Interactive Patient Education  2017 [employer].

## 2021-05-11 ENCOUNTER — Encounter: Payer: Self-pay | Admitting: Family Medicine

## 2021-05-11 ENCOUNTER — Ambulatory Visit (INDEPENDENT_AMBULATORY_CARE_PROVIDER_SITE_OTHER): Payer: PPO | Admitting: Family Medicine

## 2021-05-11 VITALS — BP 158/88 | HR 73 | Temp 98.0°F | Ht 67.0 in | Wt 171.1 lb

## 2021-05-11 DIAGNOSIS — I340 Nonrheumatic mitral (valve) insufficiency: Secondary | ICD-10-CM | POA: Diagnosis not present

## 2021-05-11 DIAGNOSIS — N529 Male erectile dysfunction, unspecified: Secondary | ICD-10-CM | POA: Insufficient documentation

## 2021-05-11 DIAGNOSIS — Z23 Encounter for immunization: Secondary | ICD-10-CM | POA: Diagnosis not present

## 2021-05-11 DIAGNOSIS — R7303 Prediabetes: Secondary | ICD-10-CM | POA: Diagnosis not present

## 2021-05-11 DIAGNOSIS — Z0000 Encounter for general adult medical examination without abnormal findings: Secondary | ICD-10-CM | POA: Diagnosis not present

## 2021-05-11 DIAGNOSIS — C61 Malignant neoplasm of prostate: Secondary | ICD-10-CM | POA: Diagnosis not present

## 2021-05-11 DIAGNOSIS — I10 Essential (primary) hypertension: Secondary | ICD-10-CM

## 2021-05-11 LAB — LIPID PANEL
Cholesterol: 157 mg/dL (ref 0–200)
HDL: 55.8 mg/dL
LDL Cholesterol: 77 mg/dL (ref 0–99)
NonHDL: 101.2
Total CHOL/HDL Ratio: 3
Triglycerides: 119 mg/dL (ref 0.0–149.0)
VLDL: 23.8 mg/dL (ref 0.0–40.0)

## 2021-05-11 LAB — HEMOGLOBIN A1C: Hgb A1c MFr Bld: 6.1 % (ref 4.6–6.5)

## 2021-05-11 LAB — COMPREHENSIVE METABOLIC PANEL
ALT: 24 U/L (ref 0–53)
AST: 37 U/L (ref 0–37)
Albumin: 4.5 g/dL (ref 3.5–5.2)
Alkaline Phosphatase: 52 U/L (ref 39–117)
BUN: 15 mg/dL (ref 6–23)
CO2: 29 mEq/L (ref 19–32)
Calcium: 9.2 mg/dL (ref 8.4–10.5)
Chloride: 99 mEq/L (ref 96–112)
Creatinine, Ser: 1.06 mg/dL (ref 0.40–1.50)
GFR: 69.49 mL/min (ref >60.00)
Glucose, Bld: 104 mg/dL — ABNORMAL HIGH (ref 70–99)
Potassium: 3.9 mEq/L (ref 3.5–5.1)
Sodium: 135 mEq/L (ref 135–145)
Total Bilirubin: 0.6 mg/dL (ref 0.2–1.2)
Total Protein: 7.9 g/dL (ref 6.0–8.3)

## 2021-05-11 MED ORDER — AMLODIPINE BESYLATE 5 MG PO TABS: 5.0000 mg | ORAL_TABLET | Freq: Every day | ORAL | 3 refills | Status: DC

## 2021-05-11 MED ORDER — CYCLOBENZAPRINE HCL 10 MG PO TABS: 10.0000 mg | ORAL_TABLET | Freq: Two times a day (BID) | ORAL | 1 refills | Status: DC | PRN

## 2021-05-11 MED ORDER — LOSARTAN POTASSIUM 50 MG PO TABS: 50.0000 mg | ORAL_TABLET | Freq: Every day | ORAL | 3 refills | Status: DC

## 2021-05-11 NOTE — Assessment & Plan Note (Signed)
Recently found to have low grade prostate cancer, now followed by urology. Appreciate uro care.

## 2021-05-11 NOTE — Patient Instructions (Addendum)
Flu shot today  Labs today  Ok to stay off HCTZ.  Let's increase losartan to 50mg  dialy, continue amlodipine 5mg  daily.  Update [place] with blood pressure effect. If interested, check with pharmacy about new 2 shot shingles series (shingrix).  Return in 3 months for BP follow up visit.   Health Maintenance After Age 73 After age 64, you are at a higher risk for certain long-term diseases and infections as well as injuries from falls. Falls are a major cause of broken bones and head injuries in people who are older than age 38. Getting regular preventive care can help to keep you healthy and well. Preventive care includes getting regular testing and making lifestyle changes as recommended by your health care provider. Talk with your health care provider about: Which screenings and tests you should have. A screening is a test that checks for a disease when you have no symptoms. A diet and exercise plan that is right for you. What should I know about screenings and tests to prevent falls? Screening and testing are the best ways to find a health problem early. Early diagnosis and treatment give you the best chance of managing medical conditions that are common after age 53. Certain conditions and lifestyle choices may make you more likely to have a fall. Your health care provider may recommend: Regular vision checks. Poor vision and conditions such as cataracts can make you more likely to have a fall. If you wear glasses, make sure to get your prescription updated if your vision changes. Medicine review. Work with your health care provider to regularly review all of the medicines you are taking, including over-the-counter medicines. Ask your health care provider about any side effects that may make you more likely to have a fall. Tell your health care provider if any medicines that you take make you feel dizzy or sleepy. Strength and balance checks. Your health care provider may recommend certain tests to  check your strength and balance while standing, walking, or changing positions. Foot health exam. Foot pain and numbness, as well as not wearing proper footwear, can make you more likely to have a fall. Screenings, including: Osteoporosis screening. Osteoporosis is a condition that causes the bones to get weaker and break more easily. Blood pressure screening. Blood pressure changes and medicines to control blood pressure can make you feel dizzy. Depression screening. You may be more likely to have a fall if you have a fear of falling, feel depressed, or feel unable to do activities that you used to do. Alcohol use screening. Using too much alcohol can affect your balance and may make you more likely to have a fall. Follow these instructions at home: Lifestyle Do not drink alcohol if: Your health care provider tells you not to drink. If you drink alcohol: Limit how much you have to: 0-1 drink a day for women. 0-2 drinks a day for men. Know how much alcohol is in your drink. In the [place] one drink equals one 12 oz bottle of beer (355 mL), one 5 oz glass of wine (148 mL), or one 1 oz glass of hard liquor (44 mL). Do not use any products that contain nicotine or tobacco. These products include cigarettes, chewing tobacco, and vaping devices, such as e-cigarettes. If you need help quitting, ask your health care provider. Activity  Follow a regular exercise program to stay fit. This will help you maintain your balance. Ask your health care provider what types of exercise are appropriate for  you. If you need a cane or walker, use it as recommended by your health care provider. Wear supportive shoes that have nonskid soles. Safety  Remove any tripping hazards, such as rugs, cords, and clutter. Install safety equipment such as grab bars in bathrooms and safety rails on stairs. Keep rooms and walkways well-lit. General instructions Talk with your health care provider about your risks for falling.  Tell your health care provider if: You fall. Be sure to tell your health care provider about all falls, even ones that seem minor. You feel dizzy, tiredness (fatigue), or off-balance. Take over-the-counter and prescription medicines only as told by your health care provider. These include supplements. Eat a healthy diet and maintain a healthy weight. A healthy diet includes low-fat dairy products, low-fat (lean) meats, and fiber from whole grains, beans, and lots of fruits and vegetables. Stay current with your vaccines. Schedule regular health, dental, and eye exams. Summary Having a healthy lifestyle and getting preventive care can help to protect your health and wellness after age 34. Screening and testing are the best way to find a health problem early and help you avoid having a fall. Early diagnosis and treatment give you the best chance for managing medical conditions that are more common for people who are older than age 15. Falls are a major cause of broken bones and head injuries in people who are older than age 63. Take precautions to prevent a fall at home. Work with your health care provider to learn what changes you can make to improve your health and wellness and to prevent falls. This information is not intended to replace advice given to you by your health care provider. Make sure you discuss any questions you have with your health care provider. Document Revised: 11/08/2020 Document Reviewed: 11/08/2020 Elsevier Patient Education  [place].

## 2021-05-11 NOTE — Progress Notes (Signed)
Patient ID: Aaron Evangelist., male    DOB: December 13, 1945, 75 y.o.   MRN: [id number]  This visit was conducted in person.  BP (!) 158/88 (BP Location: Right Arm, Cuff Size: Large)   Pulse 73   Temp 98 F (36.7 C) (Temporal)   Ht 5\' 7"  (1.702 m)   Wt 171 lb 1 oz (77.6 kg)   SpO2 99%   BMI 26.79 kg/m    CC: CPE Subjective:   HPI: Aaron Sally. is a 75 y.o. male presenting on 05/11/2021 for Annual Exam (Prt 2.  Wants to discuss stopping HCTZ.  Requests new rx for Flexeril. Also, pt brought in home BP monitor to compare.  Reading in office today 157/88. )   Saw health advisor last week for medicare wellness visit. Note reviewed.   No results found.  Flowsheet Row Clinical Support from 05/04/2021 in [facility] [place]  PHQ-2 Total Score 0       Fall Risk  05/04/2021 03/16/2020 01/27/2020 05/28/2019 01/27/2019  Falls in the past year? 1 0 0 0 0  Comment - - Emmi Telephone Survey: data to providers prior to load Emmi Telephone Survey: data to providers prior to load -  Number falls in past yr: 0 0 - - -  Injury with Fall? 0 0 - - -  Risk for fall due to : No Fall Risks Medication side effect - - -  Follow up Falls prevention discussed Falls evaluation completed;Falls prevention discussed - - -    He's recently started astaxanthin carotenoid antioxidant.   Found to have low risk prostate cancer last year, planning active surveillance with PSA/DRE and MRI/biopsy followed by urology Dr Bernardo Heater.   HTN - on amlodipine 5mg  daily, hctz 25mg  daily and losartan 25mg  daily. He stopped HCTZ this past week due to possible increased risk of skin cancer with thiazides. Home readings since stopping have been elevated to   Known multilevel herniated discs by MRI 11/2014.  Uses flexeril and etodolac PRN. Requests flexeril refilled, uses sparingly, last filled 2020.   ED - notes more trouble recently over the past 1-2 months, trouble obtaining erection.     Preventative: COLONOSCOPY Date: 05/2005 1 polyp, int hem, told ok to rpt 10 yrs ([facility]).  Cologuard WNL 12/2016, 04/2020.  Prostate cancer - see above.  Lung cancer screen - not eligible  Flu shot today COVID vaccine - [place] 10/2199 x2, no booster  Pneumovax 2014, BHALPFX-90 12/2013  Tdap 12/2011  Zostavax 2013  Shingrix - declines.  Advanced directive: Received durable power of attorney (wife then daughter) - no healthcare info available. He is working on getting medical side of things completed.  Seat belt use discussed  Sunscreen use discussed - no changing moles. Sees dermatologist Q6 mo.  Non smoker  Alcohol - some wine intermittently  Dentist - Q6 mo Eye exam yearly - borderline glaucoma Bowel - no constipation Bladder - no incontinence  Lives with wife   Grown children   Occupation: was Government social research officer, now owns landscaping business   Edu: College BA   Activity: stays active at work, walking 2 mi/day, back exercises 30 min every night.  Diet: good water, fruits/vegetables some. Follows mediterranean diet.      Relevant past medical, surgical, family and social history reviewed and updated as indicated. Interim medical history since our last visit reviewed. Allergies and medications reviewed and updated. Outpatient Medications Prior to Visit  Medication Sig Dispense Refill  beta carotene w/minerals (OCUVITE) tablet Take 1 tablet by mouth daily.     calcipotriene (DOVONOX) 0.005 % cream Apply twice a day everyday for 10-14 days to affected areas. 60 g 0   cholecalciferol (VITAMIN D) 1000 UNITS tablet Take 1,000 Units by mouth daily.     etodolac (LODINE) 400 MG tablet TAKE 1 TABLET (400 MG TOTAL) BY MOUTH 2 (TWO) TIMES DAILY AS NEEDED. 60 tablet 1   fluorouracil (EFUDEX) 5 % cream Apply twice a day everyday for 10-14 days to affected areas. 40 g 0   LUTEIN PO Take by mouth.     Multiple Vitamins-Minerals (LUTEIN-ZEAXANTHIN PO) Take by mouth.     amLODipine (NORVASC) 5  MG tablet TAKE 1 TABLET BY MOUTH EVERY DAY 90 tablet 0   ASTAXANTHIN PO Take 12 mg by mouth daily.     losartan (COZAAR) 25 MG tablet TAKE 1 TABLET BY MOUTH EVERY DAY 90 tablet 0   hydrochlorothiazide (HYDRODIURIL) 25 MG tablet TAKE 1 TABLET BY MOUTH EVERY DAY (Patient not taking: Reported on 05/11/2021) 90 tablet 0   No facility-administered medications prior to visit.     Per HPI unless specifically indicated in ROS section below Review of Systems  Constitutional:  Negative for activity change, appetite change, chills, fatigue, fever and unexpected weight change.  HENT:  Negative for hearing loss.   Eyes:  Negative for visual disturbance.  Respiratory:  Negative for cough, chest tightness, shortness of breath and wheezing.   Cardiovascular:  Negative for chest pain, palpitations and leg swelling.  Gastrointestinal:  Negative for abdominal distention, abdominal pain, blood in stool, constipation, diarrhea, nausea and vomiting.  Genitourinary:  Negative for difficulty urinating and hematuria.  Musculoskeletal:  Negative for arthralgias, myalgias and neck pain.  Skin:  Negative for rash.  Neurological:  Negative for dizziness, seizures, syncope and headaches.  Hematological:  Negative for adenopathy. Does not bruise/bleed easily.  Psychiatric/Behavioral:  Negative for dysphoric mood. The patient is not nervous/anxious.    Objective:  BP (!) 158/88 (BP Location: Right Arm, Cuff Size: Large)   Pulse 73   Temp 98 F (36.7 C) (Temporal)   Ht 5\' 7"  (1.702 m)   Wt 171 lb 1 oz (77.6 kg)   SpO2 99%   BMI 26.79 kg/m   Wt Readings from Last 3 Encounters:  05/11/21 171 lb 1 oz (77.6 kg)  05/04/21 170 lb (77.1 kg)  03/21/21 165 lb (74.8 kg)      Physical Exam Vitals and nursing note reviewed.  Constitutional:      General: He is not in acute distress.    Appearance: Normal appearance. He is well-developed. He is not ill-appearing.  HENT:     Head: Normocephalic and atraumatic.      Right Ear: Hearing, tympanic membrane, ear canal and external ear normal.     Left Ear: Hearing, tympanic membrane, ear canal and external ear normal.  Eyes:     General: No scleral icterus.    Extraocular Movements: Extraocular movements intact.     Conjunctiva/sclera: Conjunctivae normal.     Pupils: Pupils are equal, round, and reactive to light.  Neck:     Thyroid: No thyroid mass or thyromegaly.     Vascular: No carotid bruit.  Cardiovascular:     Rate and Rhythm: Normal rate and regular rhythm.     Pulses: Normal pulses.          Radial pulses are 2+ on the right side and 2+ on the left  side.     Heart sounds: Murmur (3/6 systolic at apex) heard.  Pulmonary:     Effort: Pulmonary effort is normal. No respiratory distress.     Breath sounds: Normal breath sounds. No wheezing, rhonchi or rales.  Abdominal:     General: Bowel sounds are normal. There is no distension.     Palpations: Abdomen is soft. There is no mass.     Tenderness: There is no abdominal tenderness. There is no guarding or rebound.     Hernia: No hernia is present.  Musculoskeletal:        General: Normal range of motion.     Cervical back: Normal range of motion and neck supple.     Right lower leg: No edema.     Left lower leg: No edema.  Lymphadenopathy:     Cervical: No cervical adenopathy.  Skin:    General: Skin is warm and dry.     Findings: No rash.  Neurological:     General: No focal deficit present.     Mental Status: He is alert and oriented to person, place, and time.  Psychiatric:        Mood and Affect: Mood normal.        Behavior: Behavior normal.        Thought Content: Thought content normal.        Judgment: Judgment normal.      Results for orders placed or performed in visit on 03/10/21  PSA  Result Value Ref Range   Prostate Specific Ag, Serum 4.8 (H) 0.0 - 4.0 ng/mL    Assessment & Plan:  This visit occurred during the SARS-CoV-2 public health emergency.  Safety protocols  were in place, including screening questions prior to the visit, additional usage of staff PPE, and extensive cleaning of exam room while observing appropriate contact time as indicated for disinfecting solutions.   Problem List Items Addressed This Visit     Health maintenance examination - Primary (Chronic)    Preventative protocols reviewed and updated unless pt declined. Discussed healthy diet and lifestyle.       Hypertension    Chronic, deteriorated since stopping HCTZ. Desires to remain off this due to possible increased risk of skin cancer. Will stay off HCTZ and increase losartan to 50mg  daily and reassess in 3 months. He will notify me in interim if BP remaining high despite above change.       Relevant Medications   amLODipine (NORVASC) 5 MG tablet   losartan (COZAAR) 50 MG tablet   Other Relevant Orders   Lipid panel   Comprehensive metabolic panel   Prediabetes    Update A1c.       Relevant Orders   Hemoglobin A1c   Mitral valve regurgitation    Last echo done 2019 - at that time mod MVR. Update echo.       Relevant Medications   amLODipine (NORVASC) 5 MG tablet   losartan (COZAAR) 50 MG tablet   Other Relevant Orders   ECHOCARDIOGRAM COMPLETE   Prostate cancer ([place])    Recently found to have low grade prostate cancer, now followed by urology. Appreciate uro care.       Erectile dysfunction    Recently noted, possibly temporally related to when he started astaxanthin. Suggested stop this and will reassess at 3 mo f/u visit.       Other Visit Diagnoses     Need for influenza vaccination       Relevant  Orders   Flu Vaccine QUAD High Dose(Fluad) (Completed)        Meds ordered this encounter  Medications   amLODipine (NORVASC) 5 MG tablet    Sig: Take 1 tablet (5 mg total) by mouth daily.    Dispense:  90 tablet    Refill:  3   losartan (COZAAR) 50 MG tablet    Sig: Take 1 tablet (50 mg total) by mouth daily.    Dispense:  90 tablet    Refill:  3     Note new dose   cyclobenzaprine (FLEXERIL) 10 MG tablet    Sig: Take 1 tablet (10 mg total) by mouth 2 (two) times daily as needed for muscle spasms.    Dispense:  40 tablet    Refill:  1   Orders Placed This Encounter  Procedures   Flu Vaccine QUAD High Dose(Fluad)   Lipid panel   Comprehensive metabolic panel   Hemoglobin A1c   ECHOCARDIOGRAM COMPLETE    Standing Status:   Future    Standing Expiration Date:   05/11/2022    Order Specific Question:   Where should this test be performed    Answer:   [facility]    Order Specific Question:   Perflutren DEFINITY (image enhancing agent) should be administered unless hypersensitivity or allergy exist    Answer:   Administer Perflutren    Order Specific Question:   Is a special reader required? (athlete or structural heart)    Answer:   No    Order Specific Question:   Does this study need to be read by the Structural team/Level 3 readers?    Answer:   No    Order Specific Question:   Reason for exam-Echo    Answer:   Murmur R01.1     Patient instructions: Flu shot today  Labs today  Ok to stay off HCTZ.  Let's increase losartan to 50mg  dialy, continue amlodipine 5mg  daily.  Update [place] with blood pressure effect. If interested, check with pharmacy about new 2 shot shingles series (shingrix).  Return in 3 months for BP follow up visit.   Follow up plan: Return in about 3 months (around 08/11/2021) for follow up visit.  Aaron Bush, MD

## 2021-05-11 NOTE — Assessment & Plan Note (Signed)
Preventative protocols reviewed and updated unless pt declined. Discussed healthy diet and lifestyle.  

## 2021-05-11 NOTE — Assessment & Plan Note (Signed)
Last echo done 2019 - at that time mod MVR. Update echo.

## 2021-05-11 NOTE — Assessment & Plan Note (Signed)
Chronic, deteriorated since stopping HCTZ. Desires to remain off this due to possible increased risk of skin cancer. Will stay off HCTZ and increase losartan to 50mg  daily and reassess in 3 months. He will notify me in interim if BP remaining high despite above change.

## 2021-05-11 NOTE — Assessment & Plan Note (Signed)
Recently noted, possibly temporally related to when he started astaxanthin. Suggested stop this and will reassess at 3 mo f/u visit.

## 2021-05-11 NOTE — Assessment & Plan Note (Signed)
Update A1c ?

## 2021-06-16 ENCOUNTER — Ambulatory Visit (INDEPENDENT_AMBULATORY_CARE_PROVIDER_SITE_OTHER): Payer: PPO

## 2021-06-16 ENCOUNTER — Other Ambulatory Visit: Payer: Self-pay

## 2021-06-16 DIAGNOSIS — I340 Nonrheumatic mitral (valve) insufficiency: Secondary | ICD-10-CM | POA: Diagnosis not present

## 2021-06-16 LAB — ECHOCARDIOGRAM COMPLETE
AR max vel: 3.19 cm2
AV Area VTI: 3.3 cm2
AV Area mean vel: 3.27 cm2
AV Mean grad: 3 mmHg
AV Peak grad: 6.4 mmHg
Ao pk vel: 1.26 m/s
Area-P 1/2: 4.12 cm2
Calc EF: 53.7 %
S' Lateral: 3.5 cm
Single Plane A2C EF: 54.4 %
Single Plane A4C EF: 54.1 %

## 2021-07-17 ENCOUNTER — Other Ambulatory Visit: Payer: Self-pay | Admitting: Family Medicine

## 2021-07-18 ENCOUNTER — Ambulatory Visit: Payer: PPO | Admitting: Dermatology

## 2021-07-18 ENCOUNTER — Other Ambulatory Visit: Payer: Self-pay

## 2021-07-18 DIAGNOSIS — Z1283 Encounter for screening for malignant neoplasm of skin: Secondary | ICD-10-CM | POA: Diagnosis not present

## 2021-07-18 DIAGNOSIS — D229 Melanocytic nevi, unspecified: Secondary | ICD-10-CM | POA: Diagnosis not present

## 2021-07-18 DIAGNOSIS — L821 Other seborrheic keratosis: Secondary | ICD-10-CM | POA: Diagnosis not present

## 2021-07-18 DIAGNOSIS — L578 Other skin changes due to chronic exposure to nonionizing radiation: Secondary | ICD-10-CM

## 2021-07-18 DIAGNOSIS — Z85828 Personal history of other malignant neoplasm of skin: Secondary | ICD-10-CM

## 2021-07-18 DIAGNOSIS — L570 Actinic keratosis: Secondary | ICD-10-CM | POA: Diagnosis not present

## 2021-07-18 DIAGNOSIS — L820 Inflamed seborrheic keratosis: Secondary | ICD-10-CM | POA: Diagnosis not present

## 2021-07-18 DIAGNOSIS — D225 Melanocytic nevi of trunk: Secondary | ICD-10-CM

## 2021-07-18 DIAGNOSIS — Z872 Personal history of diseases of the skin and subcutaneous tissue: Secondary | ICD-10-CM

## 2021-07-18 DIAGNOSIS — L814 Other melanin hyperpigmentation: Secondary | ICD-10-CM

## 2021-07-18 NOTE — Patient Instructions (Signed)

## 2021-07-18 NOTE — Progress Notes (Signed)
Follow-Up Visit   Subjective  Aaron Fjeld. is a 76 y.o. male who presents for the following: Upper body skin exam and Actinic Keratosis (Forehead, temples, nose, forearms, hands, used 5FU/Calcipotriene bid x 5 days to face, x 10 days to arms/hands, finished ~x 2wks to face, x 1 week to arms).  He had good reaction with a lot of red scaly spots come up.  Still healing.  The patient presents for Upper Body Skin Exam (UBSE) for skin cancer screening and mole check.  The patient has spots, moles and lesions to be evaluated, some may be new or changing and the patient has concerns that these could be cancer.   The following portions of the chart were reviewed this encounter and updated as appropriate:       Review of Systems:  No other skin or systemic complaints except as noted in HPI or Assessment and Plan.  Objective  Well appearing patient in no apparent distress; mood and affect are within normal limits.  All skin waist up examined.  L mid forearm Well healed scar with no evidence of recurrence.   R clavicle x 1 6.69mm waxy brown macule with irregular pigment R clavicle   bil forearms, bil hands, forhead, temples, nose Numerous scattered pink scaly macules forehead, temples, cheeks, forearms, hands c/w recent topical field treatment  Bright pink crusted papules R upper arm, healing  L abdomen; R inframammary area  L abdomen 2.31mm med dark brown macule  R inframammary area 3.87mm med dark brown macule  L shoulder x 2, , R 4th finger at base x 1 (3) Pink scaly macules      Assessment & Plan   Actinic Damage - Severe, confluent actinic changes with pre-cancerous actinic keratoses  - Severe, chronic, not at goal, secondary to cumulative UV radiation exposure over time - diffuse scaly erythematous macules and papules with underlying dyspigmentation - Discussed Prescription "Field Treatment" for Severe, Chronic Confluent Actinic Changes with Pre-Cancerous Actinic  Keratoses, Patient just completed course of 5FU/VitD to face, arms/hands with good result.  Still healing. Field treatment involves treatment of an entire area of skin that has confluent Actinic Changes (Sun/ Ultraviolet light damage) and PreCancerous Actinic Keratoses by method of PhotoDynamic Therapy (PDT) and/or prescription Topical Chemotherapy agents such as 5-fluorouracil, 5-fluorouracil/calcipotriene, and/or imiquimod.  The purpose is to decrease the number of clinically evident and subclinical PreCancerous lesions to prevent progression to development of skin cancer by chemically destroying early precancer changes that may or may not be visible.  It has been shown to reduce the risk of developing skin cancer in the treated area. As a result of treatment, redness, scaling, crusting, and open sores may occur during treatment course. One or more than one of these methods may be used and may have to be used several times to control, suppress and eliminate the PreCancerous changes. Discussed treatment course, expected reaction, and possible side effects. - Recommend daily broad spectrum sunscreen SPF 30+ to sun-exposed areas, reapply every 2 hours as needed.  - Staying in the shade or wearing long sleeves, sun glasses (UVA+UVB protection) and wide brim hats (4-inch brim around the entire circumference of the hat) are also recommended. - Call for new or changing lesions.   History of basal cell carcinoma (BCC) L mid forearm  Clear. Observe for recurrence. Call clinic for new or changing lesions.  Recommend regular skin exams, daily broad-spectrum spf 30+ sunscreen use, and photoprotection.    Inflamed seborrheic keratosis R  clavicle x 1  Recheck on f/up  Destruction of lesion - R clavicle x 1  Destruction method: cryotherapy   Informed consent: discussed and consent obtained   Lesion destroyed using liquid nitrogen: Yes   Region frozen until ice ball extended beyond lesion: Yes   Outcome:  patient tolerated procedure well with no complications   Post-procedure details: wound care instructions given   Additional details:  Prior to procedure, discussed risks of blister formation, small wound, skin dyspigmentation, or rare scar following cryotherapy. Recommend Vaseline ointment to treated areas while healing.   History of actinic keratoses bil forearms, bil hands, forhead, temples, nose  Good reaction to 5FU/Calcipotriene (x 5 days face, x 10 days hands/arms)- healing  Recheck R upper arm on f/u    Nevus (2) L abdomen; R inframammary area  Benign-appearing. Stable. Observation.  Call clinic for new or changing moles.  Recommend daily use of broad spectrum spf 30+ sunscreen to sun-exposed areas.    AK (actinic keratosis) (3) L shoulder x 2, , R 4th finger at base x 1  Destruction of lesion - L shoulder x 2, , R 4th finger at base x 1  Destruction method: cryotherapy   Informed consent: discussed and consent obtained   Lesion destroyed using liquid nitrogen: Yes   Region frozen until ice ball extended beyond lesion: Yes   Outcome: patient tolerated procedure well with no complications   Post-procedure details: wound care instructions given   Additional details:  Prior to procedure, discussed risks of blister formation, small wound, skin dyspigmentation, or rare scar following cryotherapy. Recommend Vaseline ointment to treated areas while healing.    Seborrheic Keratoses - Stuck-on, waxy, tan-brown papules and/or plaques  - Benign-appearing - Discussed benign etiology and prognosis. - Observe - Call for any changes - trunk - waxy tan macule L antecubitum   Lentigines - Scattered tan macules - Due to sun exposure - Benign-appering, observe - Recommend daily broad spectrum sunscreen SPF 30+ to sun-exposed areas, reapply every 2 hours as needed. - Call for any changes - trunk  Melanocytic Nevi - Tan-brown and/or pink-flesh-colored symmetric macules and  papules - Benign appearing on exam today - Observation - Call clinic for new or changing moles - Recommend daily use of broad spectrum spf 30+ sunscreen to sun-exposed areas.  - abdomen  History of Basal Cell Carcinoma of the Skin - No evidence of recurrence today L mid forearm - Recommend regular full body skin exams - Recommend daily broad spectrum sunscreen SPF 30+ to sun-exposed areas, reapply every 2 hours as needed.  - Call if any new or changing lesions are noted between office visits  - multiple  History of Squamous Cell Carcinoma of the Skin - No evidence of recurrence today - Recommend regular full body skin exams - Recommend daily broad spectrum sunscreen SPF 30+ to sun-exposed areas, reapply every 2 hours as needed.  - Call if any new or changing lesions are noted between office visits - multiple    Skin cancer screening performed today.   Return in about 3 months (around 10/16/2021) for Hx of AKs recheck R upper arm, forearms, hands, face s/p 5FU/VitD.  I, Othelia Pulling, RMA, am acting as scribe for Brendolyn Patty, MD .  Documentation: I have reviewed the above documentation for accuracy and completeness, and I agree with the above.  Brendolyn Patty MD

## 2021-07-21 ENCOUNTER — Other Ambulatory Visit: Payer: Self-pay | Admitting: Family Medicine

## 2021-07-21 NOTE — Telephone Encounter (Signed)
Strength increased to 50 mg tab.  New rx sent to pharmacy. (See 05/11/21 OV note)

## 2021-07-25 ENCOUNTER — Encounter: Payer: Self-pay | Admitting: Family Medicine

## 2021-08-01 ENCOUNTER — Encounter: Payer: Self-pay | Admitting: Family Medicine

## 2021-08-05 ENCOUNTER — Telehealth: Payer: Self-pay | Admitting: Family Medicine

## 2021-08-05 NOTE — Telephone Encounter (Signed)
Noted  

## 2021-08-09 ENCOUNTER — Other Ambulatory Visit: Payer: Self-pay

## 2021-08-09 ENCOUNTER — Telehealth (INDEPENDENT_AMBULATORY_CARE_PROVIDER_SITE_OTHER): Payer: PPO | Admitting: Family Medicine

## 2021-08-09 ENCOUNTER — Encounter: Payer: Self-pay | Admitting: Family Medicine

## 2021-08-09 DIAGNOSIS — I10 Essential (primary) hypertension: Secondary | ICD-10-CM | POA: Diagnosis not present

## 2021-08-09 NOTE — Telephone Encounter (Signed)
Seen in office today  

## 2021-08-09 NOTE — Assessment & Plan Note (Signed)
Reviewed BP log as well as readings from this week - overall adequate averages on current regimen of amlodipine 5mg  and losartan 50mg  daily in am and he's feeling well.  Noted higher readings in am - if this trend continues would consider low dose losartan 25mg  in evenings.  Keep f/u appt with labwork next month.

## 2021-08-09 NOTE — Progress Notes (Signed)
Patient ID: Aaron Evangelist., male    DOB: 01-22-1946, 76 y.o.   MRN: [id number]  Virtual visit completed through [employer], a video enabled telemedicine application. Due to national recommendations of social distancing due to COVID-19, a virtual visit is felt to be most appropriate for this patient at this time. Reviewed limitations, risks, security and privacy concerns of performing a virtual visit and the availability of in person appointments. I also reviewed that there may be a patient responsible charge related to this service. The patient agreed to proceed.   Patient location: home Provider location: [place] at [facility], office Persons participating in this virtual visit: patient, provider   If any vitals were documented, they were collected by patient at home unless specified below.    BP 140/81    Pulse 70    Temp 97.7 F (36.5 C)    Ht 5\' 7"  (1.702 m)    Wt 169 lb (76.7 kg)    BMI 26.47 kg/m    CC: HTN f/u visit  Subjective:   HPI: Aaron Wimer. is a 76 y.o. male presenting on 08/09/2021 for Hypertension (3 mo f/u. )   HTN - Compliant with current antihypertensive regimen of amlodipine 5mg  daily in am, losartan 50mg  daily in am. Off HCTZ 25mg  since 05/2021 due to possible increased skin cancer risk with thiazides (and personal history of skin cancers). Does check blood pressures at home: see log through MyChart - averaging 130-150s/70-90. Over the past week: systolics 782, 956, 213, 086, 152, 140 (morning), 143, 139, 138, 118, 128, 139, 133, 126, 137 (evenings), diastolics 96, 93, 57Q, 84, 78, 72, 86, 71, 80.   No low blood pressure readings or symptoms of dizziness/syncope.  Denies HA, vision changes, CP/tightness, SOB, leg swelling.   Notes increased stressors recently - renovating parent's house in [place] working with Chief Strategy Officer. Mother passed away end of last month.      Relevant past medical, surgical, family and social history reviewed and  updated as indicated. Interim medical history since our last visit reviewed. Allergies and medications reviewed and updated. Outpatient Medications Prior to Visit  Medication Sig Dispense Refill   amLODipine (NORVASC) 5 MG tablet Take 1 tablet (5 mg total) by mouth daily. 90 tablet 3   beta carotene w/minerals (OCUVITE) tablet Take 1 tablet by mouth daily.     cholecalciferol (VITAMIN D) 1000 UNITS tablet Take 1,000 Units by mouth daily.     cyclobenzaprine (FLEXERIL) 10 MG tablet Take 1 tablet (10 mg total) by mouth 2 (two) times daily as needed for muscle spasms. 40 tablet 1   etodolac (LODINE) 400 MG tablet TAKE 1 TABLET (400 MG TOTAL) BY MOUTH 2 (TWO) TIMES DAILY AS NEEDED. 60 tablet 1   losartan (COZAAR) 50 MG tablet Take 1 tablet (50 mg total) by mouth daily. 90 tablet 3   LUTEIN PO Take by mouth.     Multiple Vitamins-Minerals (LUTEIN-ZEAXANTHIN PO) Take by mouth.     vitamin B-12 (CYANOCOBALAMIN) 1000 MCG tablet Take 1,000 mcg by mouth daily.     No facility-administered medications prior to visit.     Per HPI unless specifically indicated in ROS section below Review of Systems Objective:  BP 140/81    Pulse 70    Temp 97.7 F (36.5 C)    Ht 5\' 7"  (1.702 m)    Wt 169 lb (76.7 kg)    BMI 26.47 kg/m   Wt Readings from Last 3 Encounters:  08/09/21 169 lb (76.7 kg)  05/11/21 171 lb 1 oz (77.6 kg)  05/04/21 170 lb (77.1 kg)       Physical exam: Gen: alert, NAD, not ill appearing Pulm: speaks in complete sentences without increased work of breathing Psych: normal mood, normal thought content       Assessment & Plan:   Problem List Items Addressed This Visit     Hypertension    Reviewed BP log as well as readings from this week - overall adequate averages on current regimen of amlodipine 5mg  and losartan 50mg  daily in am and he's feeling well.  Noted higher readings in am - if this trend continues would consider low dose losartan 25mg  in evenings.  Keep f/u appt with  labwork next month.         No orders of the defined types were placed in this encounter.  No orders of the defined types were placed in this encounter.   I discussed the assessment and treatment plan with the patient. The patient was provided an opportunity to ask questions and all were answered. The patient agreed with the plan and demonstrated an understanding of the instructions. The patient was advised to call back or seek an in-person evaluation if the symptoms worsen or if the condition fails to improve as anticipated.  Follow up plan: No follow-ups on file.  Ria Bush, MD

## 2021-08-15 ENCOUNTER — Ambulatory Visit: Payer: PPO | Admitting: Family Medicine

## 2021-09-05 ENCOUNTER — Ambulatory Visit (INDEPENDENT_AMBULATORY_CARE_PROVIDER_SITE_OTHER): Payer: PPO | Admitting: Family Medicine

## 2021-09-05 ENCOUNTER — Encounter: Payer: Self-pay | Admitting: Family Medicine

## 2021-09-05 ENCOUNTER — Other Ambulatory Visit: Payer: Self-pay

## 2021-09-05 VITALS — BP 130/76 | HR 84 | Temp 98.2°F | Ht 67.0 in | Wt 170.0 lb

## 2021-09-05 DIAGNOSIS — I10 Essential (primary) hypertension: Secondary | ICD-10-CM

## 2021-09-05 NOTE — Patient Instructions (Addendum)
BPs staying elevated.  ?Add back on hctz 12.'5mg'$  (1/2 tablet) in the morning.  ?Continue losartan '50mg'$  in am and amlodipine '5mg'$  in am.  ?If night time readings are better but morning readings staying elevated, change amlodipine '5mg'$  to nightly.  ?Update me with readings in another 2 weeks.  ?Return in 3-4 months for BP follow up visit ?Return in 2 weeks for labs.  ?

## 2021-09-05 NOTE — Progress Notes (Signed)
? ? Patient ID: Aaron Evangelist., male    DOB: 1945-09-16, 76 y.o.   MRN: [id number] ? ?This visit was conducted in person. ? ?BP 130/76   Pulse 84   Temp 98.2 ?F (36.8 ?C) (Temporal)   Ht '5\' 7"'$  (1.702 m)   Wt 170 lb (77.1 kg)   SpO2 96%   BMI 26.63 kg/m?   ? ?CC: HTN f/u visit  ?Subjective:  ? ?HPI: ?Aaron Mackins. is a 76 y.o. male presenting on 09/05/2021 for Hypertension (Has list of readings to review. ) ? ? ?HTN - Compliant with current antihypertensive regimen of amlodipine '5mg'$  daily, losartan '50mg'$  daily and 12.'5mg'$  nightly.  Does check blood pressures at home and brings meticulous log - am 140-150/80-90s, pm 120-150s/70-90s, HR70-80. No low blood pressure readings or symptoms of dizziness/syncope. Denies HA, vision changes, CP/tightness, leg swelling. Off HCTZ 05/2021 due to increased skin cancer risk. Notes mild dyspnea and easy fatigability. ? ?   ? ?Relevant past medical, surgical, family and social history reviewed and updated as indicated. Interim medical history since our last visit reviewed. ?Allergies and medications reviewed and updated. ?Outpatient Medications Prior to Visit  ?Medication Sig Dispense Refill  ? amLODipine (NORVASC) 5 MG tablet Take 1 tablet (5 mg total) by mouth daily. 90 tablet 3  ? beta carotene w/minerals (OCUVITE) tablet Take 1 tablet by mouth daily.    ? cholecalciferol (VITAMIN D) 1000 UNITS tablet Take 1,000 Units by mouth daily.    ? cyclobenzaprine (FLEXERIL) 10 MG tablet Take 1 tablet (10 mg total) by mouth 2 (two) times daily as needed for muscle spasms. 40 tablet 1  ? etodolac (LODINE) 400 MG tablet TAKE 1 TABLET (400 MG TOTAL) BY MOUTH 2 (TWO) TIMES DAILY AS NEEDED. 60 tablet 1  ? hydrochlorothiazide (MICROZIDE) 12.5 MG capsule Take 1 capsule (12.5 mg total) by mouth daily.    ? LUTEIN PO Take by mouth.    ? Multiple Vitamins-Minerals (LUTEIN-ZEAXANTHIN PO) Take by mouth.    ? vitamin B-12 (CYANOCOBALAMIN) 1000 MCG tablet Take 1,000 mcg by mouth daily.     ? losartan (COZAAR) 50 MG tablet Take 1 tablet (50 mg total) by mouth daily. (Patient taking differently: Take 50 mg by mouth daily. Added 12.'5mg'$  at night on 08/22/21) 90 tablet 3  ? losartan (COZAAR) 50 MG tablet Take 1 tablet (50 mg total) by mouth daily.    ? ?No facility-administered medications prior to visit.  ?  ? ?Per HPI unless specifically indicated in ROS section below ?Review of Systems ? ?Objective:  ?BP 130/76   Pulse 84   Temp 98.2 ?F (36.8 ?C) (Temporal)   Ht '5\' 7"'$  (1.702 m)   Wt 170 lb (77.1 kg)   SpO2 96%   BMI 26.63 kg/m?   ?Wt Readings from Last 3 Encounters:  ?09/05/21 170 lb (77.1 kg)  ?08/09/21 169 lb (76.7 kg)  ?05/11/21 171 lb 1 oz (77.6 kg)  ?  ?  ?Physical Exam ?Vitals and nursing note reviewed.  ?Constitutional:   ?   Appearance: Normal appearance. He is not ill-appearing.  ?Eyes:  ?   Extraocular Movements: Extraocular movements intact.  ?   Conjunctiva/sclera: Conjunctivae normal.  ?   Pupils: Pupils are equal, round, and reactive to light.  ?Cardiovascular:  ?   Rate and Rhythm: Normal rate and regular rhythm.  ?   Pulses: Normal pulses.  ?   Heart sounds: Normal heart sounds. No murmur heard. ?Pulmonary:  ?  Effort: Pulmonary effort is normal. No respiratory distress.  ?   Breath sounds: Normal breath sounds. No wheezing, rhonchi or rales.  ?Musculoskeletal:  ?   Right lower leg: No edema.  ?   Left lower leg: No edema.  ?Skin: ?   General: Skin is warm and dry.  ?   Findings: No rash.  ?Neurological:  ?   Mental Status: He is alert.  ?Psychiatric:     ?   Mood and Affect: Mood normal.     ?   Behavior: Behavior normal.  ? ?   ?Lab Results  ?Component Value Date  ? CREATININE 1.06 05/11/2021  ? BUN 15 05/11/2021  ? NA 135 05/11/2021  ? K 3.9 05/11/2021  ? CL 99 05/11/2021  ? CO2 29 05/11/2021  ?  ? ?Assessment & Plan:  ?This visit occurred during the SARS-CoV-2 public health emergency.  Safety protocols were in place, including screening questions prior to the visit, additional  usage of staff PPE, and extensive cleaning of exam room while observing appropriate contact time as indicated for disinfecting solutions.  ? ?Problem List Items Addressed This Visit   ? ? Hypertension - Primary  ?  Chronic, BP at goal in office today however home readings averaging too high. He notes better BP control when on 3 drug regimen. Agrees to restart hctz despite possible increased risk of skin cancer of thiazide diuretic family.  ?Will add hctz 12.'5mg'$  in the morning in addition to losartan '50mg'$  and amlodipine '5mg'$  daily. Update [place] with BP readings in 2 wks. RTC 2 wks lab visit to monitor Cr/K on hctz. RTC 3-4 mo HTN f/u visit. Pt agrees with plan.  ?  ?  ? Relevant Medications  ? losartan (COZAAR) 50 MG tablet  ? hydrochlorothiazide (MICROZIDE) 12.5 MG capsule  ? Other Relevant Orders  ? Basic metabolic panel  ?  ? ?No orders of the defined types were placed in this encounter. ? ?Orders Placed This Encounter  ?Procedures  ? Basic metabolic panel  ?  Standing Status:   Future  ?  Standing Expiration Date:   09/06/2022  ? ? ? ?Patient Instructions  ?BPs staying elevated.  ?Add back on hctz 12.'5mg'$  (1/2 tablet) in the morning.  ?Continue losartan '50mg'$  in am and amlodipine '5mg'$  in am.  ?If night time readings are better but morning readings staying elevated, change amlodipine '5mg'$  to nightly.  ?Update me with readings in another 2 weeks.  ?Return in 3-4 months for BP follow up visit ?Return in 2 weeks for labs.  ? ?Follow up plan: ?Return in about 3 months (around 12/06/2021), or if symptoms worsen or fail to improve, for follow up visit. ? ?Ria Bush, MD   ?

## 2021-09-05 NOTE — Assessment & Plan Note (Addendum)
Chronic, BP at goal in office today however home readings averaging too high. He notes better BP control when on 3 drug regimen. Agrees to restart hctz despite possible increased risk of skin cancer of thiazide diuretic family.  ?Will add hctz 12.'5mg'$  in the morning in addition to losartan '50mg'$  and amlodipine '5mg'$  daily. Update [place] with BP readings in 2 wks. RTC 2 wks lab visit to monitor Cr/K on hctz. RTC 3-4 mo HTN f/u visit. Pt agrees with plan.  ?

## 2021-09-08 ENCOUNTER — Other Ambulatory Visit: Payer: Self-pay

## 2021-09-08 DIAGNOSIS — C61 Malignant neoplasm of prostate: Secondary | ICD-10-CM

## 2021-09-13 ENCOUNTER — Other Ambulatory Visit: Payer: PPO

## 2021-09-13 ENCOUNTER — Other Ambulatory Visit: Payer: Self-pay

## 2021-09-13 DIAGNOSIS — C61 Malignant neoplasm of prostate: Secondary | ICD-10-CM | POA: Diagnosis not present

## 2021-09-14 LAB — PSA: Prostate Specific Ag, Serum: 4.4 ng/mL — ABNORMAL HIGH (ref 0.0–4.0)

## 2021-09-19 ENCOUNTER — Encounter: Payer: Self-pay | Admitting: Family Medicine

## 2021-09-19 ENCOUNTER — Other Ambulatory Visit: Payer: Self-pay

## 2021-09-19 ENCOUNTER — Encounter: Payer: Self-pay | Admitting: Urology

## 2021-09-19 ENCOUNTER — Ambulatory Visit: Payer: PPO | Admitting: Urology

## 2021-09-19 VITALS — BP 156/77 | HR 86 | Ht 68.0 in | Wt 170.0 lb

## 2021-09-19 DIAGNOSIS — N5201 Erectile dysfunction due to arterial insufficiency: Secondary | ICD-10-CM | POA: Diagnosis not present

## 2021-09-19 DIAGNOSIS — C61 Malignant neoplasm of prostate: Secondary | ICD-10-CM | POA: Diagnosis not present

## 2021-09-19 MED ORDER — TADALAFIL 20 MG PO TABS: ORAL_TABLET | ORAL | 0 refills | Status: DC

## 2021-09-19 NOTE — Progress Notes (Signed)
? ?09/19/2021 ?9:22 AM  ? ?Aaron Brown. ?Nov 07, 1945 ?[id number] ? ?Referring provider: Ria Bush, MD ?[address] ?[place],  [place] [address] ? ?Chief Complaint  ?Patient presents with  ? Prostate Cancer  ? ? ?Urologic history: ?1.  Prostate cancer ?T1c low risk ?Fusion biopsy 08/2020 PI-RADS 4/PI-RADS 3 lesions; 46 g gland ?PSA 5.1 ?ROI lesions benign ?Gleason 3+3 adenocarcinoma RLM, RA, RLA (5%, 20%, 5%) ? ? ?HPI: ?76 y.o. male presents for 48-monthfollow-up. ? ?Doing well since last visit ?No bothersome LUTS ?Denies dysuria, gross hematuria ?Denies flank, abdominal or pelvic pain ?PSA 09/13/2021 stable at 4.4 ?Complaining of erectile dysfunction over the last 3-4 months.  He has difficulty achieving an erection and for the past month his erections have not been firm enough for penetration.  Organic risk factors hypertension and antihypertensive medication ? ?PMH: ?Past Medical History:  ?Diagnosis Date  ? Actinic keratosis   ? Asymmetrical left sensorineural hearing loss 12/2013  ? s/p MRI pending hearing aides ([name]  ? Basal cell carcinoma 10/21/2019  ? L ala crease  ? Basal cell carcinoma 11/11/2018  ? R chin/excision  ? Basal cell carcinoma 03/12/2018  ? L mid forearm  ? Basal cell carcinoma 05/30/2016  ? R inf clavicle, L lateral canthus  ? Basal cell carcinoma 01/06/2014  ? L elbow  ? Basal cell carcinoma 11/02/2020  ? L mid forearm, recurrent. Exc 12/27/2020.  ? Blood in stool   ? in the past; ? hemorrhoids  ? Cerebral microvascular disease 12/2013  ? by MRI, referred to neuro by ENT  ? Colon polyp 05/2005  ? ?hyperplastic  ? History of chicken pox   ? Hypertension   ? Increased pressure in the eye   ? Dr. BGloriann Loan ? Lower back pain   ? bulging disc with spinal stenosis L4/5 s/p surgery x2  ? Prediabetes   ? A1c 6.0 (2012)  ? Seasonal allergies   ? Squamous acanthoma of face   ? Squamous cell cancer of skin of hand 10/22/2018  ? R hand dorsum below first webspace  ? Squamous cell  carcinoma of neck 06/26/2013  ? Right anterior neck/excision  ? Squamous cell carcinoma of skin 11/17/2019  ? Left dorsal hand. SCCis hypertrophic.  ? ? ?Surgical History: ?Past Surgical History:  ?Procedure Laterality Date  ? BUNIONECTOMY  2003  ? Dr. PBarkley Bruns ? COLONOSCOPY  05/2005  ? 1 polyp, int hem   ? DOBUTAMINE STRESS ECHO  2013  ? normal per prior PCP records  ? hemi-laminectomy  2000  ? Dr. PAnnette Stable Dr. KHal NeerL4/L5  ? LAMINECTOMY  1988  ? Dr. CPearlie Oyster L5  ? SQUAMOUS CELL CARCINOMA EXCISION  2014  ? face and hand  ? ? ?Home Medications:  ?Allergies as of 09/19/2021   ? ?   Reactions  ? Lisinopril   ? May have caused lip swelling  ? ?  ? ?  ?Medication List  ?  ? ?  ? Accurate as of September 19, 2021  9:22 AM. If you have any questions, ask your nurse or doctor.  ?  ?  ? ?  ? ?amLODipine 5 MG tablet ?Commonly known as: NORVASC ?Take 1 tablet (5 mg total) by mouth daily. ?  ?beta carotene w/minerals tablet ?Take 1 tablet by mouth daily. ?  ?cholecalciferol 1000 units tablet ?Commonly known as: VITAMIN D ?Take 1,000 Units by mouth daily. ?  ?cyclobenzaprine 10 MG tablet ?Commonly known as: FLEXERIL ?Take  1 tablet (10 mg total) by mouth 2 (two) times daily as needed for muscle spasms. ?  ?etodolac 400 MG tablet ?Commonly known as: LODINE ?TAKE 1 TABLET (400 MG TOTAL) BY MOUTH 2 (TWO) TIMES DAILY AS NEEDED. ?  ?hydrochlorothiazide 12.5 MG capsule ?Commonly known as: MICROZIDE ?Take 1 capsule (12.5 mg total) by mouth daily. ?  ?losartan 50 MG tablet ?Commonly known as: COZAAR ?Take 1 tablet (50 mg total) by mouth daily. ?  ?LUTEIN PO ?Take by mouth. ?  ?LUTEIN-ZEAXANTHIN PO ?Take by mouth. ?  ?vitamin B-12 1000 MCG tablet ?Commonly known as: CYANOCOBALAMIN ?Take 1,000 mcg by mouth daily. ?  ? ?  ? ? ?Allergies:  ?Allergies  ?Allergen Reactions  ? Lisinopril   ?  May have caused lip swelling  ? ? ?Family History: ?Family History  ?Problem Relation Age of Onset  ? Hypertension Mother   ? Arthritis Father   ?  Hypertension Father   ? Alcohol abuse Maternal Grandfather   ? Cancer Maternal Aunt   ?     colon  ? Cancer Cousin   ?     colon  ? Cancer Cousin   ?     breast  ? ? ?Social History:  reports that he has never smoked. He has never used smokeless tobacco. He reports current alcohol use. He reports that he does not use drugs. ? ? ?Physical Exam: ?BP (!) 156/77   Pulse 86   Ht '5\' 8"'$  (1.727 m)   Wt 170 lb (77.1 kg)   BMI 25.85 kg/m?   ?Constitutional:  Alert and oriented, No acute distress. ?HEENT: [place] AT, moist mucus membranes.  Trachea midline, no masses. ?Cardiovascular: No clubbing, cyanosis, or edema. ?Respiratory: Normal respiratory effort, no increased work of breathing. ?GI: Prostate 40 g, smooth without nodules ? ? ?Assessment & Plan:   ? ?1.  T1c low risk prostate cancer ?Stable PSA ?32-monthfollow-up PSA/DRE ?Discussed follow-up MRI and confirmatory biopsy.  He is 1 year out from diagnosis with stable PSA ?Recommend repeat prostate MRI at the time of next follow-up.  If MRI has not changed would recommend a standard template biopsy for confirmation as his ROI lesions were negative for cancer ? ?2.  Erectile dysfunction ?New problem ?He was interested in a PDE 5 inhibitor trial and Rx tadalafil 20 mg sent to pharmacy ?Side effects were discussed and he may start out at 10 mg if desired ? ? ?SAbbie Sons MD ? ?[place]?[address] ?[place] [address]?[phone] ? ?

## 2021-09-20 ENCOUNTER — Other Ambulatory Visit (INDEPENDENT_AMBULATORY_CARE_PROVIDER_SITE_OTHER): Payer: PPO

## 2021-09-20 DIAGNOSIS — C61 Malignant neoplasm of prostate: Secondary | ICD-10-CM

## 2021-09-20 DIAGNOSIS — I10 Essential (primary) hypertension: Secondary | ICD-10-CM

## 2021-09-20 LAB — BASIC METABOLIC PANEL
BUN: 11 mg/dL (ref 6–23)
CO2: 29 mEq/L (ref 19–32)
Calcium: 9 mg/dL (ref 8.4–10.5)
Chloride: 97 mEq/L (ref 96–112)
Creatinine, Ser: 1.02 mg/dL (ref 0.40–1.50)
GFR: 72.59 mL/min (ref >60.00)
Glucose, Bld: 101 mg/dL — ABNORMAL HIGH (ref 70–99)
Potassium: 4.3 mEq/L (ref 3.5–5.1)
Sodium: 131 mEq/L — ABNORMAL LOW (ref 135–145)

## 2021-09-21 ENCOUNTER — Encounter: Payer: Self-pay | Admitting: Family Medicine

## 2021-09-21 ENCOUNTER — Other Ambulatory Visit: Payer: Self-pay | Admitting: Family Medicine

## 2021-09-21 MED ORDER — LOSARTAN POTASSIUM 100 MG PO TABS: 100.0000 mg | ORAL_TABLET | Freq: Every day | ORAL | 1 refills | Status: DC

## 2021-10-17 ENCOUNTER — Ambulatory Visit: Payer: PPO | Admitting: Dermatology

## 2021-10-17 DIAGNOSIS — L821 Other seborrheic keratosis: Secondary | ICD-10-CM | POA: Diagnosis not present

## 2021-10-17 DIAGNOSIS — L570 Actinic keratosis: Secondary | ICD-10-CM | POA: Diagnosis not present

## 2021-10-17 DIAGNOSIS — L578 Other skin changes due to chronic exposure to nonionizing radiation: Secondary | ICD-10-CM

## 2021-10-17 NOTE — Progress Notes (Signed)
? ?Follow-Up Visit ?  ?Subjective  ?Aaron Brown. is a 76 y.o. male who presents for the following: Actinic Keratosis (Face, bil forearms, recheck R upper arm). Has completed 5FU/VitD course on face/arms/hands with good results.  Has not treated scalp yet. ? ?The patient has spots, moles and lesions to be evaluated, some may be new or changing and the patient has concerns that these could be cancer. ? ? ?The following portions of the chart were reviewed this encounter and updated as appropriate:  ?  ?  ? ?Review of Systems:  No other skin or systemic complaints except as noted in HPI or Assessment and Plan. ? ?Objective  ?Well appearing patient in no apparent distress; mood and affect are within normal limits. ? ?A focused examination was performed including face, arms. Relevant physical exam findings are noted in the Assessment and Plan. ? ?R upper arm x 2, L hand dorsum x 6, L forearm x 1, R  hand dorsum x 2, L temple x 1, L earlobe x 1, Frontal scalp x 2 (15) ?Pink scaly macules ? ? ? ?Assessment & Plan  ?AK (actinic keratosis) (15) ?R upper arm x 2, L hand dorsum x 6, L forearm x 1, R  hand dorsum x 2, L temple x 1, L earlobe x 1, Frontal scalp x 2 ? ?Destruction of lesion - R upper arm x 2, L hand dorsum x 6, L forearm x 1, R  hand dorsum x 2, L temple x 1, L earlobe x 1, Frontal scalp x 2 ? ?Destruction method: cryotherapy   ?Informed consent: discussed and consent obtained   ?Lesion destroyed using liquid nitrogen: Yes   ?Region frozen until ice ball extended beyond lesion: Yes   ?Outcome: patient tolerated procedure well with no complications   ?Post-procedure details: wound care instructions given   ?Additional details:  Prior to procedure, discussed risks of blister formation, small wound, skin dyspigmentation, or rare scar following cryotherapy. Recommend Vaseline ointment to treated areas while healing.  ? ? ?Actinic Damage - Severe, confluent actinic changes with pre-cancerous actinic keratoses   ?- Severe, chronic, not at goal, secondary to cumulative UV radiation exposure over time ?- diffuse scaly erythematous macules and papules with underlying dyspigmentation ?- Discussed Prescription "Field Treatment" for Severe, Chronic Confluent Actinic Changes with Pre-Cancerous Actinic Keratoses ?Field treatment involves treatment of an entire area of skin that has confluent Actinic Changes (Sun/ Ultraviolet light damage) and PreCancerous Actinic Keratoses by method of PhotoDynamic Therapy (PDT) and/or prescription Topical Chemotherapy agents such as 5-fluorouracil, 5-fluorouracil/calcipotriene, and/or imiquimod.  The purpose is to decrease the number of clinically evident and subclinical PreCancerous lesions to prevent progression to development of skin cancer by chemically destroying early precancer changes that may or may not be visible.  It has been shown to reduce the risk of developing skin cancer in the treated area. As a result of treatment, redness, scaling, crusting, and open sores may occur during treatment course. One or more than one of these methods may be used and may have to be used several times to control, suppress and eliminate the PreCancerous changes. Discussed treatment course, expected reaction, and possible side effects. ?- Recommend daily broad spectrum sunscreen SPF 30+ to sun-exposed areas, reapply every 2 hours as needed.  ?- Staying in the shade or wearing long sleeves, sun glasses (UVA+UVB protection) and wide brim hats (4-inch brim around the entire circumference of the hat) are also recommended. ?- Call for new or changing lesions.  ?-  Start 5-fluorouracil/calcipotriene cream twice a day for 10-15 days to affected areas including scalp. Patient provided with contact information for pharmacy and advised the pharmacy will mail the prescription to their home. Patient provided with handout reviewing treatment course and side effects and advised to call or message [place] on MyChart with any  concerns.  ?5-fluorouracil/calcipotriene cream is is a type of field treatment used to treat precancers, thin skin cancers, and areas of sun damage. Expected reaction includes irritation and mild inflammation potentially progressing to more severe inflammation including redness, scaling, crusting and open sores/erosions.  If too much irritation occurs, ensure application of only a thin layer and decrease frequency of use to achieve a tolerable level of inflammation. Recommend applying Vaseline ointment to open sores as needed.  Minimize sun exposure while under treatment. Recommend daily broad spectrum sunscreen SPF 30+ to sun-exposed areas, reapply every 2 hours as needed.  ? ?   ? ?Seborrheic Keratoses ?- Stuck-on, waxy, tan-brown papules and/or plaques  ?- Benign-appearing ?- Discussed benign etiology and prognosis. ?- Observe ?- Call for any changes ? ? ?Return in about 6 months (around 04/18/2022) for AK f/u/field treatment scalp. ? ?I, Othelia Pulling, RMA, am acting as scribe for Brendolyn Patty, MD . ? ?Documentation: I have reviewed the above documentation for accuracy and completeness, and I agree with the above. ? ?Brendolyn Patty MD  ? ?

## 2021-10-17 NOTE — Patient Instructions (Addendum)
Start 5-fluorouracil/calcipotriene cream twice a day for 10-15 days to affected areas including scalp ? ?5-fluorouracil/calcipotriene cream is is a type of field treatment used to treat precancers, thin skin cancers, and areas of sun damage. Expected reaction includes irritation and mild inflammation potentially progressing to more severe inflammation including redness, scaling, crusting and open sores/erosions.  If too much irritation occurs, ensure application of only a thin layer and decrease frequency of use to achieve a tolerable level of inflammation. Recommend applying Vaseline ointment to open sores as needed.  Minimize sun exposure while under treatment. Recommend daily broad spectrum sunscreen SPF 30+ to sun-exposed areas, reapply every 2 hours as needed.  ? ?   ? ? ? ?Cryotherapy Aftercare ? ?Wash gently with soap and water everyday.   ?Apply Vaseline and Band-Aid daily until healed.  ? ? ?If You Need Anything After Your Visit ? ?If you have any questions or concerns for your doctor, please call our main line at [phone] and press option 4 to reach your doctor's medical assistant. If no one answers, please leave a voicemail as directed and we will return your call as soon as possible. Messages left after 4 pm will be answered the following business day.  ? ?You may also send [place] a message via MyChart. We typically respond to MyChart messages within 1-2 business days. ? ?For prescription refills, please ask your pharmacy to contact our office. Our fax number is [phone]. ? ?If you have an urgent issue when the clinic is closed that cannot wait until the next business day, you can page your doctor at the number below.   ? ?Please note that while we do our best to be available for urgent issues outside of office hours, we are not available 24/7.  ? ?If you have an urgent issue and are unable to reach [place], you may choose to seek medical care at your doctor's office, retail clinic, urgent care center, or  emergency room. ? ?If you have a medical emergency, please immediately call 911 or go to the emergency department. ? ?Pager Numbers ? ?- Dr. Nehemiah Massed: [phone] ? ?- Dr. Laurence Ferrari: [phone] ? ?- Dr. Nicole Kindred: [phone] ? ?In the event of inclement weather, please call our main line at [phone] for an update on the status of any delays or closures. ? ?Dermatology Medication Tips: ?Please keep the boxes that topical medications come in in order to help keep track of the instructions about where and how to use these. Pharmacies typically print the medication instructions only on the boxes and not directly on the medication tubes.  ? ?If your medication is too expensive, please contact our office at [phone] option 4 or send [place] a message through [place].  ? ?We are unable to tell what your co-pay for medications will be in advance as this is different depending on your insurance coverage. However, we may be able to find a substitute medication at lower cost or fill out paperwork to get insurance to cover a needed medication.  ? ?If a prior authorization is required to get your medication covered by your insurance company, please allow [place] 1-2 business days to complete this process. ? ?Drug prices often vary depending on where the prescription is filled and some pharmacies may offer cheaper prices. ? ?The website www.goodrx.com contains coupons for medications through different pharmacies. The prices here do not account for what the cost may be with help from insurance (it may be cheaper with your insurance), but the website can  give you the price if you did not use any insurance.  ?- You can print the associated coupon and take it with your prescription to the pharmacy.  ?- You may also stop by our office during regular business hours and pick up a GoodRx coupon card.  ?- If you need your prescription sent electronically to a different pharmacy, notify our office through [facility] or by phone at  [phone] option 4. ? ? ? ? ?Si Usted Necesita Algo Despu?s de Su Visita ? ?Tambi?n puede enviarnos un mensaje a trav?s de MyChart. Por lo general respondemos a los mensajes de MyChart en el transcurso de 1 a 2 d?as h?biles. ? ?Para renovar recetas, por favor pida a su farmacia que se ponga en contacto con nuestra oficina. Nuestro n?mero de fax es el [phone]. ? ?Si tiene un asunto urgente cuando la cl?nica est? cerrada y que no puede esperar hasta el siguiente d?a h?bil, puede llamar/localizar a su doctor(a) al n?mero que aparece a continuaci?n.  ? ?Por favor, tenga en cuenta que aunque hacemos todo lo posible para estar disponibles para asuntos urgentes fuera del horario de oficina, no estamos disponibles las 24 horas del d?a, los 7 d?as de la semana.  ? ?Si tiene un problema urgente y no puede comunicarse con nosotros, puede optar por buscar atenci?n m?dica  en el consultorio de su doctor(a), en una cl?nica privada, en un centro de atenci?n urgente o en una sala de emergencias. ? ?Si tiene Engineer, maintenance (IT) m?dica, por favor llame inmediatamente al 911 o vaya a la sala de emergencias. ? ?N?meros de b?per ? ?- Dr. Nehemiah Massed: [phone] ? ?- Dra. Moye: [phone] ? ?- Dra. Nicole Kindred: [phone] ? ?En caso de inclemencias del tiempo, por favor llame a nuestra l?nea principal al [phone] para una actualizaci?n sobre el estado de cualquier retraso o cierre. ? ?Consejos para la medicaci?n en dermatolog?a: ?Por favor, guarde las cajas en las que vienen los medicamentos de uso t?pico para ayudarle a seguir las instrucciones sobre d?nde y c?mo usarlos. Las farmacias generalmente imprimen las instrucciones del medicamento s?lo en las cajas y no directamente en los tubos del [place].  ? ?Si su medicamento es muy caro, por favor, p?ngase en contacto con Zigmund Daniel llamando al [phone] y presione la opci?n 4 o env?enos un mensaje a trav?s de MyChart.  ? ?No podemos decirle cu?l ser? su copago por los  medicamentos por adelantado ya que esto es diferente dependiendo de la cobertura de su seguro. Sin embargo, es posible que podamos encontrar un medicamento sustituto a Electrical engineer un formulario para que el seguro cubra el medicamento que se considera necesario.  ? ?Si se requiere Ardelia Mems autorizaci?n previa para que su compa??a de seguros [place] su medicamento, por favor perm?tanos de 1 a 2 d?as h?biles para completar este proceso. ? ?Los precios de los medicamentos var?an con frecuencia dependiendo del Environmental consultant de d?nde se surte la receta y alguna farmacias pueden ofrecer precios m?s baratos. ? ?El sitio web www.goodrx.com tiene cupones para medicamentos de Airline pilot. Los precios aqu? no tienen en cuenta lo que podr?a costar con la ayuda del seguro (puede ser m?s barato con su seguro), pero el sitio web puede darle el precio si no utiliz? ning?n seguro.  ?- Puede imprimir el cup?n correspondiente y llevarlo con su receta a la farmacia.  ?- Tambi?n puede pasar por nuestra oficina durante el horario de atenci?n regular y recoger una tarjeta de cupones de GoodRx.  ?-  Si necesita que su receta se env?e electr?nicamente a Chiropodist, informe a nuestra oficina a trav?s de MyChart de [place] o por tel?fono llamando al [phone] y presione la opci?n 4.  ?

## 2021-11-07 ENCOUNTER — Telehealth: Payer: Self-pay | Admitting: Family Medicine

## 2021-11-24 ENCOUNTER — Telehealth: Payer: Self-pay

## 2021-11-24 ENCOUNTER — Ambulatory Visit (INDEPENDENT_AMBULATORY_CARE_PROVIDER_SITE_OTHER): Payer: PPO | Admitting: Family

## 2021-11-24 VITALS — BP 134/76 | HR 85 | Temp 98.5°F | Resp 16 | Ht 68.0 in | Wt 172.1 lb

## 2021-11-24 DIAGNOSIS — Z20822 Contact with and (suspected) exposure to covid-19: Secondary | ICD-10-CM

## 2021-11-24 DIAGNOSIS — J020 Streptococcal pharyngitis: Secondary | ICD-10-CM | POA: Diagnosis not present

## 2021-11-24 DIAGNOSIS — J029 Acute pharyngitis, unspecified: Secondary | ICD-10-CM | POA: Diagnosis not present

## 2021-11-24 LAB — POCT RAPID STREP A (OFFICE): Rapid Strep A Screen: POSITIVE — AB

## 2021-11-24 LAB — POC COVID19 BINAXNOW: SARS Coronavirus 2 Ag: NEGATIVE

## 2021-11-24 MED ORDER — AMOXICILLIN-POT CLAVULANATE 875-125 MG PO TABS: 1.0000 | ORAL_TABLET | Freq: Two times a day (BID) | ORAL | 0 refills | Status: DC

## 2021-11-24 MED ORDER — PREDNISONE 20 MG PO TABS: ORAL_TABLET | ORAL | 0 refills | Status: DC

## 2021-11-24 NOTE — Telephone Encounter (Signed)
Seen pt on 11/24/2021

## 2021-11-24 NOTE — Progress Notes (Signed)
Established Patient Office Visit  Subjective:  Patient ID: Aaron Brown., male    DOB: [birth year]  Age: 76 y.o. MRN: [id number]  CC:  Chief Complaint  Patient presents with   Sore Throat    X 3 days   Cough    No covid test X 3 days    HPI Aaron Brown. is here today with concerns.   Pt with c/o sore throat and cough for the last six days.  Cough is non productive and dry Bil ear pressure.  Sore throat, worse last night. Clearing throat often. No fever no chills  Has had increased post nasal drip Not currently taking medication.   Has not covid tested.    Past Medical History:  Diagnosis Date   Actinic keratosis    Asymmetrical left sensorineural hearing loss 12/2013   s/p MRI pending hearing aides [name])   Basal cell carcinoma 10/21/2019   L ala crease   Basal cell carcinoma 11/11/2018   R chin/excision   Basal cell carcinoma 03/12/2018   L mid forearm   Basal cell carcinoma 05/30/2016   R inf clavicle, L lateral canthus   Basal cell carcinoma 01/06/2014   L elbow   Basal cell carcinoma 11/02/2020   L mid forearm, recurrent. Exc 12/27/2020.   Blood in stool    in the past; ? hemorrhoids   Cerebral microvascular disease 12/2013   by MRI, referred to neuro by ENT   Colon polyp 05/2005   ?hyperplastic   History of chicken pox    Hypertension    Increased pressure in the eye    Dr. Gloriann Loan   Lower back pain    bulging disc with spinal stenosis L4/5 s/p surgery x2   Prediabetes    A1c 6.0 (2012)   Seasonal allergies    Squamous acanthoma of face    Squamous cell cancer of skin of hand 10/22/2018   R hand dorsum below first webspace   Squamous cell carcinoma of neck 06/26/2013   Right anterior neck/excision   Squamous cell carcinoma of skin 11/17/2019   Left dorsal hand. SCCis hypertrophic.    Past Surgical History:  Procedure Laterality Date   BUNIONECTOMY  2003   Dr. Barkley Bruns   COLONOSCOPY  05/2005   1 polyp, int hem    DOBUTAMINE  STRESS ECHO  2013   normal per prior PCP records   hemi-laminectomy  2000   Dr. Annette Stable, Dr. Hal Neer L4/L5   LAMINECTOMY  1988   Dr. Pearlie Oyster; L5   SQUAMOUS CELL CARCINOMA EXCISION  2014   face and hand    Family History  Problem Relation Age of Onset   Hypertension Mother    Arthritis Father    Hypertension Father    Alcohol abuse Maternal Grandfather    Cancer Maternal Aunt        colon   Cancer Cousin        colon   Cancer Cousin        breast    Social History   Socioeconomic History   Marital status: Married    Spouse name: Not on file   Number of children: Not on file   Years of education: Not on file   Highest education level: Not on file  Occupational History   Not on file  Tobacco Use   Smoking status: Never   Smokeless tobacco: Never  Vaping Use   Vaping Use: Never used  Substance and Sexual Activity  Alcohol use: Yes    Alcohol/week: 0.0 standard drinks    Comment: Occasional-wine   Drug use: No   Sexual activity: Yes  Other Topics Concern   Not on file  Social History Narrative   Lives with wife   grown children   Occupation: was Government social research officer, now owns landscaping business   Edu: Sports administrator   Activity: stays active at work   Diet: good water, fruits/vegetables some   Social Determinants of Radio broadcast assistant Strain: Low Risk    Difficulty of Paying Living Expenses: Not hard at all  Food Insecurity: No Food Insecurity   Worried About Charity fundraiser in the Last Year: Never true   Arboriculturist in the Last Year: Never true  Transportation Needs: No Transportation Needs   Lack of Transportation (Medical): No   Lack of Transportation (Non-Medical): No  Physical Activity: Sufficiently Active   Days of Exercise per Week: 7 days   Minutes of Exercise per Session: 40 min  Stress: No Stress Concern Present   Feeling of Stress : Not at all  Social Connections: Socially Integrated   Frequency of Communication with Friends and  Family: Three times a week   Frequency of Social Gatherings with Friends and Family: Once a week   Attends Religious Services: More than 4 times per year   Active Member of [employer] or Organizations: Yes   Attends Music therapist: More than 4 times per year   Marital Status: Married  Human resources officer Violence: Not At Risk   Fear of Current or Ex-Partner: No   Emotionally Abused: No   Physically Abused: No   Sexually Abused: No    Outpatient Medications Prior to Visit  Medication Sig Dispense Refill   amLODipine (NORVASC) 5 MG tablet Take 1 tablet (5 mg total) by mouth daily. 90 tablet 3   beta carotene w/minerals (OCUVITE) tablet Take 1 tablet by mouth daily.     cholecalciferol (VITAMIN D) 1000 UNITS tablet Take 1,000 Units by mouth daily.     cyclobenzaprine (FLEXERIL) 10 MG tablet Take 1 tablet (10 mg total) by mouth 2 (two) times daily as needed for muscle spasms. 40 tablet 1   etodolac (LODINE) 400 MG tablet TAKE 1 TABLET (400 MG TOTAL) BY MOUTH 2 (TWO) TIMES DAILY AS NEEDED. 60 tablet 1   losartan (COZAAR) 100 MG tablet Take 1 tablet (100 mg total) by mouth daily. 90 tablet 1   LUTEIN PO Take by mouth.     Multiple Vitamins-Minerals (LUTEIN-ZEAXANTHIN PO) Take by mouth.     tadalafil (CIALIS) 20 MG tablet Take 1 tab 1 hour prior to intercourse. 10 tablet 0   vitamin B-12 (CYANOCOBALAMIN) 1000 MCG tablet Take 1,000 mcg by mouth daily.     No facility-administered medications prior to visit.    Allergies  Allergen Reactions   Hydrochlorothiazide Other (See Comments)    Mild drop in sodium   Lisinopril     May have caused lip swelling    ROS Review of Systems  Constitutional:  Negative for chills, fatigue and fever.  HENT:  Positive for ear pain (bil ear fullness), postnasal drip and sore throat. Negative for congestion, rhinorrhea, sinus pressure, sinus pain and sneezing.   Eyes:  Negative for discharge.  Respiratory:  Positive for cough. Negative for chest  tightness, shortness of breath and wheezing.   Cardiovascular:  Negative for chest pain and palpitations.     Objective:  Physical Exam Constitutional:      General: He is awake. He is not in acute distress.    Appearance: Normal appearance. He is not ill-appearing.  HENT:     Head: Normocephalic.     Right Ear: Tympanic membrane normal.     Left Ear: Tympanic membrane normal.     Nose: Nose normal.     Right Turbinates: Not enlarged or swollen.     Left Turbinates: Not enlarged or swollen.     Right Sinus: No maxillary sinus tenderness or frontal sinus tenderness.     Left Sinus: No maxillary sinus tenderness or frontal sinus tenderness.     Mouth/Throat:     Mouth: Mucous membranes are moist.     Pharynx: No pharyngeal swelling, oropharyngeal exudate or posterior oropharyngeal erythema.  Eyes:     Extraocular Movements: Extraocular movements intact.     Pupils: Pupils are equal, round, and reactive to light.  Cardiovascular:     Rate and Rhythm: Normal rate and regular rhythm.  Pulmonary:     Effort: Pulmonary effort is normal.     Breath sounds: Normal breath sounds. No wheezing.  Neurological:     Mental Status: He is alert.    BP 134/76   Pulse 85   Temp 98.5 F (36.9 C)   Resp 16   Ht '5\' 8"'$  (1.727 m)   Wt 172 lb 2 oz (78.1 kg)   SpO2 96%   BMI 26.17 kg/m  Wt Readings from Last 3 Encounters:  11/24/21 172 lb 2 oz (78.1 kg)  09/19/21 170 lb (77.1 kg)  09/05/21 170 lb (77.1 kg)     Health Maintenance Due  Topic Date Due   Zoster Vaccines- Shingrix (1 of 2) Never done   COVID-19 Vaccine (3 - Pfizer risk series) 11/20/2019    There are no preventive care reminders to display for this patient.  Lab Results  Component Value Date   TSH 3.36 11/04/2018   Lab Results  Component Value Date   WBC 5.3 11/04/2018   HGB 14.3 11/04/2018   HCT 40.0 11/04/2018   MCV 91.8 11/04/2018   PLT 210.0 11/04/2018   Lab Results  Component Value Date   NA 131 (L)  09/20/2021   K 4.3 09/20/2021   CO2 29 09/20/2021   GLUCOSE 101 (H) 09/20/2021   BUN 11 09/20/2021   CREATININE 1.02 09/20/2021   BILITOT 0.6 05/11/2021   ALKPHOS 52 05/11/2021   AST 37 05/11/2021   ALT 24 05/11/2021   PROT 7.9 05/11/2021   ALBUMIN 4.5 05/11/2021   CALCIUM 9.0 09/20/2021   GFR 72.59 09/20/2021   Lab Results  Component Value Date   HGBA1C 6.1 05/11/2021      Assessment & Plan:   Problem List Items Addressed This Visit       Respiratory   Strep pharyngitis    Strep tested positive in office.  rx for augmentin 875/125 mg po bid x 10 days Ibuprofen/tyelnol prn sore throat/fever Pt told to F/u if no improvement in the next 2-3 days.         Other   Suspected COVID-19 virus infection    covid tested in office, negative       Relevant Orders   POC COVID-19 BinaxNow (Completed)   Sore throat - Primary    Strep tested in office, positive Warm salt water gargles        Relevant Medications   predniSONE (DELTASONE) 20 MG tablet   amoxicillin-clavulanate (  AUGMENTIN) 875-125 MG tablet   Other Relevant Orders   POCT rapid strep A (Completed)    Meds ordered this encounter  Medications   predniSONE (DELTASONE) 20 MG tablet    Sig: Take two tablets daily for 3 days followed by one tablet daily for 4 days    Dispense:  10 tablet    Refill:  0    Order Specific Question:   Supervising Provider    Answer:   BEDSOLE, AMY E [[id number]]   amoxicillin-clavulanate (AUGMENTIN) 875-125 MG tablet    Sig: Take 1 tablet by mouth 2 (two) times daily.    Dispense:  20 tablet    Refill:  0    Order Specific Question:   Supervising Provider    Answer:   BEDSOLE, AMY E [[id number]]    Follow-up: No follow-ups on file.    Eugenia Pancoast, FNP

## 2021-11-24 NOTE — Telephone Encounter (Signed)
[facility] Night - Client TELEPHONE ADVICE RECORD [facility] Patient Name: STAN WHIT LOW Gender: Male DOB: Apr 24, 1946 Age: 76 Y 3 M 25 D Return Phone Number: [phone] (Primary) Address: City/ State/ Zip: [place]  [address] Client [place] Night - Client Client Site [facility] Provider Ria Bush - MD Contact Type Call Who Is Calling Patient / Member / Family / Caregiver Call Type Triage / Clinical Relationship To Patient Self Return Phone Number [phone] (Primary) Chief Complaint Flu Symptom Reason for Call Symptomatic / Request for [place] has a prolong cold and flu like symptoms with cough and congestion with sore throat. Translation No Nurse Assessment Nurse: Luan Pulling, RN, [place] Date/Time (Eastern Time): 11/24/2021 8:47:53 AM Confirm and document reason for call. If symptomatic, describe symptoms. ---Caller states he has a cough and congestion with sore throat that was an 8/10. Had some honey last night and now sore throat is 2/10. Have not checked temp today. Does the patient have any new or worsening symptoms? ---Yes Will a triage be completed? ---Yes Related visit to physician within the last 2 weeks? ---No Does the PT have any chronic conditions? (i.e. diabetes, asthma, this includes High risk factors for pregnancy, etc.) ---Yes List chronic conditions. ---HTN Is this a behavioral health or substance abuse call? ---No Guidelines Guideline Title Affirmed Question Affirmed Notes Nurse Date/Time Eilene Ghazi Time) Common Cold Lisbeth Ply, RN, [place] 11/24/2021 8:51:15 AM Disp. Time Eilene Ghazi Time) Disposition Final User 11/24/2021 8:54:55 AM See PCP within 24 Hours Yes Luan Pulling, RN, Maryjean Morn PLEASE NOTE: All timestamps contained within this report are represented as [place] Standard Time. CONFIDENTIALTY NOTICE: This fax  transmission is intended only for the addressee. It contains information that is legally privileged, confidential or otherwise protected from use or disclosure. If you are not the intended recipient, you are strictly prohibited from reviewing, disclosing, copying using or disseminating any of this information or taking any action in reliance on or regarding this information. If you have received this fax in error, please notify us immediately by telephone so that we can arrange for its return to [place]. Phone: [phone], Toll-Free: [phone], Fax: [phone] Page: 2 of 2 Call Id: [id number] [place] Disagree/Comply Comply Caller Understands Yes PreDisposition Call Doctor Care Advice Given Per Guideline SEE PCP WITHIN 24 HOURS: * IF OFFICE WILL BE OPEN: You need to be examined within the next 24 hours. Call your doctor (or NP/PA) when the office opens and make an appointment. PAIN MEDICINES: * For pain relief, you can take either acetaminophen, ibuprofen, or naproxen. * They are over-the-counter (OTC) pain drugs. You can buy them at the drugstore. * ACETAMINOPHEN - REGULAR STRENGTH TYLENOL: Take 650 mg (two 325 mg pills) by mouth every 4 to 6 hours as needed. Each Regular Strength Tylenol pill has 325 mg of acetaminophen. The most you should take is 10 pills a day (3,250 mg total). Note: In [place], the maximum is 12 pills a day (3,900 mg total). * IBUPROFEN (E.G., MOTRIN, ADVIL): Take 400 mg (two 200 mg pills) by mouth every 6 hours. The most you should take is 6 pills a day (1,200 mg total). NASAL DECONGESTANTS FOR A VERY STUFFY NOSE: * MOST PEOPLE DO NOT NEED TO USE THESE MEDICINES. HUMIDIFIER: * If the air is dry, use a humidifier in the bedroom. * Dry air makes coughs worse. CALL BACK IF: * Fever over 104 F (40 C) * You have  difficulty breathing * You become worse CARE ADVICE given per Common Cold (Adult) guideline. Referrals REFERRED TO PCP OFFIC

## 2021-11-24 NOTE — Telephone Encounter (Signed)
Per appt notes pt already has appt scheduled for 11/24/21 at 12:20pm. Sending note to Eugenia Pancoast FNP and Claiborne Billings CMA.

## 2021-11-24 NOTE — Patient Instructions (Signed)
Recommend some flonase daily    Due to recent changes in healthcare laws, you may see results of your imaging and/or laboratory studies on MyChart before I have had a chance to review them.  I understand that in some cases there may be results that are confusing or concerning to you. Please understand that not all results are received at the same time and often I may need to interpret multiple results in order to provide you with the best plan of care or course of treatment. Therefore, I ask that you please give me 2 business days to thoroughly review all your results before contacting my office for clarification. Should we see a critical lab result, you will be contacted sooner.   It was a pleasure seeing you today! Please do not hesitate to reach out with any questions and or concerns.  Regards,   Eugenia Pancoast FNP-C

## 2021-11-25 NOTE — Telephone Encounter (Signed)
Saw patient already, thank you for speaking with the patient.  Please see note for further information.  

## 2021-11-29 DIAGNOSIS — J029 Acute pharyngitis, unspecified: Secondary | ICD-10-CM | POA: Insufficient documentation

## 2021-11-29 DIAGNOSIS — J020 Streptococcal pharyngitis: Secondary | ICD-10-CM | POA: Insufficient documentation

## 2021-11-29 DIAGNOSIS — Z20822 Contact with and (suspected) exposure to covid-19: Secondary | ICD-10-CM | POA: Insufficient documentation

## 2021-11-29 NOTE — Assessment & Plan Note (Signed)
covid tested in office, negative.  

## 2021-11-29 NOTE — Assessment & Plan Note (Signed)
Strep tested in office, positive Warm salt water gargles   

## 2021-11-29 NOTE — Assessment & Plan Note (Signed)
Strep tested positive in office.  rx for augmentin 875/125 mg po bid x 10 days Ibuprofen/tyelnol prn sore throat/fever Pt told to F/u if no improvement in the next 2-3 days.

## 2021-12-02 ENCOUNTER — Other Ambulatory Visit: Payer: Self-pay

## 2021-12-02 ENCOUNTER — Encounter: Payer: Self-pay | Admitting: Family

## 2021-12-02 ENCOUNTER — Ambulatory Visit (INDEPENDENT_AMBULATORY_CARE_PROVIDER_SITE_OTHER): Payer: PPO | Admitting: Family

## 2021-12-02 VITALS — BP 138/80 | HR 83 | Temp 97.7°F | Resp 16 | Ht 68.0 in | Wt 169.6 lb

## 2021-12-02 DIAGNOSIS — R9431 Abnormal electrocardiogram [ECG] [EKG]: Secondary | ICD-10-CM | POA: Diagnosis not present

## 2021-12-02 DIAGNOSIS — R079 Chest pain, unspecified: Secondary | ICD-10-CM

## 2021-12-02 DIAGNOSIS — K219 Gastro-esophageal reflux disease without esophagitis: Secondary | ICD-10-CM

## 2021-12-02 DIAGNOSIS — J302 Other seasonal allergic rhinitis: Secondary | ICD-10-CM

## 2021-12-02 MED ORDER — OMEPRAZOLE 20 MG PO CPDR: 20.0000 mg | DELAYED_RELEASE_CAPSULE | Freq: Every day | ORAL | 0 refills | Status: DC

## 2021-12-02 NOTE — Assessment & Plan Note (Signed)
Trial omeprazole for 2 weeks. Set in rx.  Try to decrease and or avoid spicy foods, fried fatty foods, and also caffeine and chocolate as these can increase heartburn symptoms.

## 2021-12-02 NOTE — Assessment & Plan Note (Signed)
ekg in office , reviewed Possible ST elevation slight with left axis deviation lead v2, however stable from ekg reviewed 05/15/12 Pt currently asymptomatic, referring to cardiologist for intermittent chronic chest pain.  Also treating for gerd as possible indigestion.

## 2021-12-02 NOTE — Progress Notes (Signed)
Established Patient Office Visit  Subjective:  Patient ID: Aaron Brown., male    DOB: 1946/04/15  Age: 76 y.o. MRN: [id number]  CC:  Chief Complaint  Patient presents with   Cough    X 8 days Coughing and had a sharp pain on the left side.    HPI Shooter Tangen. is here today for follow up.  Pt is without acute concerns.  Pt was seen 5/25 for strep throat.  Was strep positive.   States still has ongoing cough, and feels irritation to his throat that puts him into coughing fits.  Not coughing much up, not deep chest congestion, but having to clear his throat for 15-20 minutes.  Has helped with some honey and water with some mild relief but still ongoing.  Sore throat has resolved.   Also concerned with left sided chest pains about three times in the day which have since resolved.  He has had these 'twinges' before, but intermittently. He did also just finish his prednisone yesterday.  Some mild burn, was a few days ago not today. Mainly in the evening he will feel the indigestion.  Doe stake some tums and that improves the symptoms.   Not taking anything for allergies.   Past Medical History:  Diagnosis Date   Actinic keratosis    Asymmetrical left sensorineural hearing loss 12/2013   s/p MRI pending hearing aides [name])   Basal cell carcinoma 10/21/2019   L ala crease   Basal cell carcinoma 11/11/2018   R chin/excision   Basal cell carcinoma 03/12/2018   L mid forearm   Basal cell carcinoma 05/30/2016   R inf clavicle, L lateral canthus   Basal cell carcinoma 01/06/2014   L elbow   Basal cell carcinoma 11/02/2020   L mid forearm, recurrent. Exc 12/27/2020.   Blood in stool    in the past; ? hemorrhoids   Cerebral microvascular disease 12/2013   by MRI, referred to neuro by ENT   Colon polyp 05/2005   ?hyperplastic   History of chicken pox    Hypertension    Increased pressure in the eye    Dr. Gloriann Loan   Lower back pain    bulging disc with  spinal stenosis L4/5 s/p surgery x2   Prediabetes    A1c 6.0 (2012)   Seasonal allergies    Squamous acanthoma of face    Squamous cell cancer of skin of hand 10/22/2018   R hand dorsum below first webspace   Squamous cell carcinoma of neck 06/26/2013   Right anterior neck/excision   Squamous cell carcinoma of skin 11/17/2019   Left dorsal hand. SCCis hypertrophic.    Past Surgical History:  Procedure Laterality Date   BUNIONECTOMY  2003   Dr. Barkley Bruns   COLONOSCOPY  05/2005   1 polyp, int hem    DOBUTAMINE STRESS ECHO  2013   normal per prior PCP records   hemi-laminectomy  2000   Dr. Annette Stable, Dr. Hal Neer L4/L5   LAMINECTOMY  1988   Dr. Pearlie Oyster; L5   SQUAMOUS CELL CARCINOMA EXCISION  2014   face and hand    Family History  Problem Relation Age of Onset   Hypertension Mother    Arthritis Father    Hypertension Father    Alcohol abuse Maternal Grandfather    Cancer Maternal Aunt        colon   Cancer Cousin        colon   Cancer  Cousin        breast    Social History   Socioeconomic History   Marital status: Married    Spouse name: Not on file   Number of children: Not on file   Years of education: Not on file   Highest education level: Not on file  Occupational History   Not on file  Tobacco Use   Smoking status: Never   Smokeless tobacco: Never  Vaping Use   Vaping Use: Never used  Substance and Sexual Activity   Alcohol use: Yes    Alcohol/week: 0.0 standard drinks    Comment: Occasional-wine   Drug use: No   Sexual activity: Yes  Other Topics Concern   Not on file  Social History Narrative   Lives with wife   grown children   Occupation: was Government social research officer, now owns landscaping business   Edu: Sports administrator   Activity: stays active at work   Diet: good water, fruits/vegetables some   Social Determinants of Radio broadcast assistant Strain: Low Risk    Difficulty of Paying Living Expenses: Not hard at all  Food Insecurity: No Food  Insecurity   Worried About Charity fundraiser in the Last Year: Never true   Arboriculturist in the Last Year: Never true  Transportation Needs: No Transportation Needs   Lack of Transportation (Medical): No   Lack of Transportation (Non-Medical): No  Physical Activity: Sufficiently Active   Days of Exercise per Week: 7 days   Minutes of Exercise per Session: 40 min  Stress: No Stress Concern Present   Feeling of Stress : Not at all  Social Connections: Socially Integrated   Frequency of Communication with Friends and Family: Three times a week   Frequency of Social Gatherings with Friends and Family: Once a week   Attends Religious Services: More than 4 times per year   Active Member of [employer] or Organizations: Yes   Attends Music therapist: More than 4 times per year   Marital Status: Married  Human resources officer Violence: Not At Risk   Fear of Current or Ex-Partner: No   Emotionally Abused: No   Physically Abused: No   Sexually Abused: No    Outpatient Medications Prior to Visit  Medication Sig Dispense Refill   amLODipine (NORVASC) 5 MG tablet Take 1 tablet (5 mg total) by mouth daily. 90 tablet 3   amoxicillin-clavulanate (AUGMENTIN) 875-125 MG tablet Take 1 tablet by mouth 2 (two) times daily. 20 tablet 0   beta carotene w/minerals (OCUVITE) tablet Take 1 tablet by mouth daily.     cholecalciferol (VITAMIN D) 1000 UNITS tablet Take 1,000 Units by mouth daily.     cyclobenzaprine (FLEXERIL) 10 MG tablet Take 1 tablet (10 mg total) by mouth 2 (two) times daily as needed for muscle spasms. 40 tablet 1   etodolac (LODINE) 400 MG tablet TAKE 1 TABLET (400 MG TOTAL) BY MOUTH 2 (TWO) TIMES DAILY AS NEEDED. 60 tablet 1   losartan (COZAAR) 100 MG tablet Take 1 tablet (100 mg total) by mouth daily. 90 tablet 1   LUTEIN PO Take by mouth.     Multiple Vitamins-Minerals (LUTEIN-ZEAXANTHIN PO) Take by mouth.     predniSONE (DELTASONE) 20 MG tablet Take two tablets daily for 3  days followed by one tablet daily for 4 days 10 tablet 0   tadalafil (CIALIS) 20 MG tablet Take 1 tab 1 hour prior to intercourse. 10 tablet 0  vitamin B-12 (CYANOCOBALAMIN) 1000 MCG tablet Take 1,000 mcg by mouth daily.     No facility-administered medications prior to visit.    Allergies  Allergen Reactions   Hydrochlorothiazide Other (See Comments)    Mild drop in sodium   Lisinopril     May have caused lip swelling        Objective:    Physical Exam Constitutional:      General: He is not in acute distress.    Appearance: Normal appearance. He is normal weight. He is not ill-appearing, toxic-appearing or diaphoretic.  HENT:     Head: Normocephalic.     Right Ear: Tympanic membrane normal.     Left Ear: Tympanic membrane normal.     Nose: Nose normal.     Mouth/Throat:     Pharynx: Posterior oropharyngeal erythema present.  Eyes:     Pupils: Pupils are equal, round, and reactive to light.  Cardiovascular:     Rate and Rhythm: Normal rate and regular rhythm.  Pulmonary:     Effort: Pulmonary effort is normal.     Breath sounds: Normal breath sounds.  Abdominal:     Tenderness: There is no abdominal tenderness.  Musculoskeletal:     Cervical back: Normal range of motion.  Skin:    General: Skin is warm.  Neurological:     General: No focal deficit present.     Mental Status: He is alert and oriented to person, place, and time.     Motor: No weakness.     Gait: Gait normal.  Psychiatric:        Mood and Affect: Mood normal.        Behavior: Behavior normal.        Thought Content: Thought content normal.        Judgment: Judgment normal.      BP 138/80   Pulse 83   Temp 97.7 F (36.5 C)   Resp 16   Ht '5\' 8"'$  (1.727 m)   Wt 169 lb 9 oz (76.9 kg)   SpO2 96%   BMI 25.78 kg/m  Wt Readings from Last 3 Encounters:  12/02/21 169 lb 9 oz (76.9 kg)  11/24/21 172 lb 2 oz (78.1 kg)  09/19/21 170 lb (77.1 kg)     Health Maintenance Due  Topic Date Due    Zoster Vaccines- Shingrix (1 of 2) Never done   COVID-19 Vaccine (3 - Pfizer risk series) 11/20/2019    There are no preventive care reminders to display for this patient.  Lab Results  Component Value Date   TSH 3.36 11/04/2018   Lab Results  Component Value Date   WBC 5.3 11/04/2018   HGB 14.3 11/04/2018   HCT 40.0 11/04/2018   MCV 91.8 11/04/2018   PLT 210.0 11/04/2018   Lab Results  Component Value Date   NA 131 (L) 09/20/2021   K 4.3 09/20/2021   CO2 29 09/20/2021   GLUCOSE 101 (H) 09/20/2021   BUN 11 09/20/2021   CREATININE 1.02 09/20/2021   BILITOT 0.6 05/11/2021   ALKPHOS 52 05/11/2021   AST 37 05/11/2021   ALT 24 05/11/2021   PROT 7.9 05/11/2021   ALBUMIN 4.5 05/11/2021   CALCIUM 9.0 09/20/2021   GFR 72.59 09/20/2021   Lab Results  Component Value Date   CHOL 157 05/11/2021   Lab Results  Component Value Date   HDL 55.80 05/11/2021   Lab Results  Component Value Date   LDLCALC 77 05/11/2021  Lab Results  Component Value Date   TRIG 119.0 05/11/2021   Lab Results  Component Value Date   CHOLHDL 3 05/11/2021   Lab Results  Component Value Date   HGBA1C 6.1 05/11/2021      Assessment & Plan:   Problem List Items Addressed This Visit       Digestive   Gastroesophageal reflux disease without esophagitis    Trial omeprazole for 2 weeks. Set in rx.  Try to decrease and or avoid spicy foods, fried fatty foods, and also caffeine and chocolate as these can increase heartburn symptoms.         Relevant Medications   omeprazole (PRILOSEC) 20 MG capsule     Other   Seasonal allergies    Recommend pt start daily flonase and nightly zyrtec for allergy symptoms       Chest pain - Primary    ekg in office , reviewed Possible ST elevation slight with left axis deviation lead v2, however stable from ekg reviewed 05/15/12 Pt currently asymptomatic, referring to cardiologist for intermittent chronic chest pain.  Also treating for gerd as  possible indigestion.       Relevant Orders   EKG 12-Lead (Completed)   Ambulatory referral to Cardiology   Abnormal EKG   Relevant Orders   Ambulatory referral to Cardiology    Meds ordered this encounter  Medications   omeprazole (PRILOSEC) 20 MG capsule    Sig: Take 1 capsule (20 mg total) by mouth daily.    Dispense:  30 capsule    Refill:  0    Order Specific Question:   Supervising Provider    Answer:   BEDSOLE, AMY E [[id number]]    Follow-up: No follow-ups on file.    Eugenia Pancoast, FNP

## 2021-12-02 NOTE — Assessment & Plan Note (Signed)
Recommend pt start daily flonase and nightly zyrtec for allergy symptoms

## 2021-12-02 NOTE — Patient Instructions (Signed)
A referral was placed today for cardiology Please let us know if you have not heard back within 2 weeks about the referral.  Start daily flonase  Start nightly zyrtec for allergies  Please start trial of omeprazole 20 mg as prescribed. Take this medication for two weeks, administer 30 minutes prior to breakfast each am. Try to decrease and or avoid spicy foods, fried fatty foods, and also caffeine and chocolate as these can increase heartburn symptoms.    Due to recent changes in healthcare laws, you may see results of your imaging and/or laboratory studies on MyChart before I have had a chance to review them.  I understand that in some cases there may be results that are confusing or concerning to you. Please understand that not all results are received at the same time and often I may need to interpret multiple results in order to provide you with the best plan of care or course of treatment. Therefore, I ask that you please give me 2 business days to thoroughly review all your results before contacting my office for clarification. Should we see a critical lab result, you will be contacted sooner.   It was a pleasure seeing you today! Please do not hesitate to reach out with any questions and or concerns.  Regards,   Eugenia Pancoast FNP-C

## 2021-12-19 ENCOUNTER — Ambulatory Visit: Payer: PPO | Admitting: Family Medicine

## 2021-12-26 ENCOUNTER — Encounter: Payer: Self-pay | Admitting: Family Medicine

## 2021-12-26 ENCOUNTER — Ambulatory Visit (INDEPENDENT_AMBULATORY_CARE_PROVIDER_SITE_OTHER): Payer: PPO | Admitting: Family Medicine

## 2021-12-26 VITALS — BP 138/84 | HR 82 | Temp 97.8°F | Ht 68.0 in | Wt 171.4 lb

## 2021-12-26 DIAGNOSIS — C61 Malignant neoplasm of prostate: Secondary | ICD-10-CM

## 2021-12-26 DIAGNOSIS — I10 Essential (primary) hypertension: Secondary | ICD-10-CM

## 2021-12-30 ENCOUNTER — Other Ambulatory Visit: Payer: Self-pay | Admitting: Family

## 2021-12-30 DIAGNOSIS — K219 Gastro-esophageal reflux disease without esophagitis: Secondary | ICD-10-CM

## 2022-01-13 NOTE — Telephone Encounter (Signed)
error 

## 2022-01-19 ENCOUNTER — Ambulatory Visit (INDEPENDENT_AMBULATORY_CARE_PROVIDER_SITE_OTHER): Payer: PPO | Admitting: Cardiology

## 2022-01-19 ENCOUNTER — Encounter: Payer: Self-pay | Admitting: Cardiology

## 2022-01-19 ENCOUNTER — Other Ambulatory Visit
Admission: RE | Admit: 2022-01-19 | Discharge: 2022-01-19 | Disposition: A | Discharge status: Home / Self Care | Payer: PPO | Attending: Cardiology | Admitting: Cardiology

## 2022-01-19 VITALS — BP 120/70 | HR 80 | Ht 68.0 in | Wt 170.1 lb

## 2022-01-19 DIAGNOSIS — R072 Precordial pain: Secondary | ICD-10-CM | POA: Diagnosis not present

## 2022-01-19 DIAGNOSIS — I10 Essential (primary) hypertension: Secondary | ICD-10-CM | POA: Diagnosis not present

## 2022-01-19 LAB — BASIC METABOLIC PANEL
Anion gap: 7 (ref 5–15)
BUN: 17 mg/dL (ref 8–23)
CO2: 26 mmol/L (ref 22–32)
Calcium: 9.3 mg/dL (ref 8.9–10.3)
Chloride: 105 mmol/L (ref 98–111)
Creatinine, Ser: 1.25 mg/dL — ABNORMAL HIGH (ref 0.61–1.24)
GFR, Estimated: >60 mL/min (ref >60)
Glucose, Bld: 105 mg/dL — ABNORMAL HIGH (ref 70–99)
Potassium: 4.3 mmol/L (ref 3.5–5.1)
Sodium: 138 mmol/L (ref 135–145)

## 2022-01-19 MED ORDER — IVABRADINE HCL 5 MG PO TABS: 10.0000 mg | ORAL_TABLET | Freq: Once | ORAL | 0 refills | Status: AC

## 2022-01-19 MED ORDER — METOPROLOL TARTRATE 100 MG PO TABS: 100.0000 mg | ORAL_TABLET | Freq: Once | ORAL | 0 refills | Status: DC

## 2022-01-19 NOTE — Progress Notes (Signed)
Cardiology Office Note:    Date:  01/19/2022   ID:  Aaron Brown., DOB 09/17/1945, MRN [id number]  PCP:  Ria Bush, MD   [place] [place] [facility] Cardiologist:  Kate Sable, MD     Referring MD: Eugenia Pancoast, FNP   Chief Complaint  Patient presents with   Other    Chest pain/abn EKG. Meds reviewed verbally with pt.   Aaron Brown. is a 76 y.o. male who is being seen today for the evaluation of chest pain at the request of Eugenia Pancoast, [place].   History of Present Illness:    Aaron Brown. is a 76 y.o. male with a hx of hypertension who presented due to chest pain.  Patient had a respiratory infection a month ago with about 3 weeks of dry nonproductive cough.  During that episode, he noticed left-sided chest discomfort.  Symptoms are not associated with exertion.  Since then he has had occasional symptoms of chest discomfort.  Denies any prior episodes before his respiratory infection.  He denies edema, shortness of breath.  Mother had an MI in her 70s.   Echocardiogram performed on 06/2021 EF 55 to 93%, diastolic function normal, mild MR,  Past Medical History:  Diagnosis Date   Actinic keratosis    Asymmetrical left sensorineural hearing loss 12/2013   s/p MRI pending hearing aides [name])   Basal cell carcinoma 10/21/2019   L ala crease   Basal cell carcinoma 11/11/2018   R chin/excision   Basal cell carcinoma 03/12/2018   L mid forearm   Basal cell carcinoma 05/30/2016   R inf clavicle, L lateral canthus   Basal cell carcinoma 01/06/2014   L elbow   Basal cell carcinoma 11/02/2020   L mid forearm, recurrent. Exc 12/27/2020.   Blood in stool    in the past; ? hemorrhoids   Cerebral microvascular disease 12/2013   by MRI, referred to neuro by ENT   Colon polyp 05/2005   ?hyperplastic   History of chicken pox    Hypertension    Increased pressure in the eye    Dr. Clayborne Artist heart valve    Lower back pain     bulging disc with spinal stenosis L4/5 s/p surgery x2   Prediabetes    A1c 6.0 (2012)   Seasonal allergies    Squamous acanthoma of face    Squamous cell cancer of skin of hand 10/22/2018   R hand dorsum below first webspace   Squamous cell carcinoma of neck 06/26/2013   Right anterior neck/excision   Squamous cell carcinoma of skin 11/17/2019   Left dorsal hand. SCCis hypertrophic.    Past Surgical History:  Procedure Laterality Date   BUNIONECTOMY  2003   Dr. Barkley Bruns   COLONOSCOPY  05/2005   1 polyp, int hem    DOBUTAMINE STRESS ECHO  2013   normal per prior PCP records   hemi-laminectomy  2000   Dr. Annette Stable, Dr. Hal Neer L4/L5   LAMINECTOMY  1988   Dr. Pearlie Oyster; L5   SQUAMOUS CELL CARCINOMA EXCISION  2014   face and hand    Current Medications: Current Meds  Medication Sig   amLODipine (NORVASC) 5 MG tablet Take 1 tablet (5 mg total) by mouth daily.   beta carotene w/minerals (OCUVITE) tablet Take 1 tablet by mouth daily.   cholecalciferol (VITAMIN D) 1000 UNITS tablet Take 1,000 Units by mouth daily.   cyclobenzaprine (FLEXERIL) 10 MG tablet Take 1  tablet (10 mg total) by mouth 2 (two) times daily as needed for muscle spasms.   etodolac (LODINE) 400 MG tablet TAKE 1 TABLET (400 MG TOTAL) BY MOUTH 2 (TWO) TIMES DAILY AS NEEDED.   ivabradine (CORLANOR) 5 MG TABS tablet Take 2 tablets (10 mg total) by mouth once for 1 dose. Take 2 hours prior to your CT scan.   losartan (COZAAR) 100 MG tablet Take 1 tablet (100 mg total) by mouth daily.   LUTEIN PO Take by mouth.   metoprolol tartrate (LOPRESSOR) 100 MG tablet Take 1 tablet (100 mg total) by mouth once for 1 dose. Take 2 hours prior to your CT scan.   Multiple Vitamins-Minerals (LUTEIN-ZEAXANTHIN PO) Take by mouth.   tadalafil (CIALIS) 20 MG tablet Take 1 tab 1 hour prior to intercourse.   vitamin B-12 (CYANOCOBALAMIN) 1000 MCG tablet Take 1,000 mcg by mouth daily.     Allergies:   Hydrochlorothiazide and Lisinopril    Social History   Socioeconomic History   Marital status: Married    Spouse name: Not on file   Number of children: Not on file   Years of education: Not on file   Highest education level: Not on file  Occupational History   Not on file  Tobacco Use   Smoking status: Never   Smokeless tobacco: Never  Vaping Use   Vaping Use: Never used  Substance and Sexual Activity   Alcohol use: Yes    Alcohol/week: 0.0 standard drinks of alcohol    Comment: Occasional-wine   Drug use: No   Sexual activity: Yes  Other Topics Concern   Not on file  Social History Narrative   Lives with wife   grown children   Occupation: was Government social research officer, now owns landscaping business   Edu: Sports administrator   Activity: stays active at work   Diet: good water, fruits/vegetables some   Social Determinants of Radio broadcast assistant Strain: [place]  (05/04/2021)   Overall Financial Resource Strain (CARDIA)    Difficulty of Paying Living Expenses: Not hard at all  Food Insecurity: No Food Insecurity (05/04/2021)   Hunger Vital Sign    Worried About Running Out of Food in the Last Year: Never true    [place] in the Last Year: Never true  Transportation Needs: No Transportation Needs (05/04/2021)   PRAPARE - Hydrologist (Medical): No    Lack of Transportation (Non-Medical): No  Physical Activity: Sufficiently Active (05/04/2021)   Exercise Vital Sign    Days of Exercise per Week: 7 days    Minutes of Exercise per Session: 40 min  Stress: No Stress Concern Present (05/04/2021)   [place]    Feeling of Stress : Not at all  Social Connections: [place] (05/04/2021)   Social Connection and Isolation Panel [NHANES]    Frequency of Communication with Friends and Family: Three times a week    Frequency of Social Gatherings with Friends and Family: Once a week    Attends Religious Services:  More than 4 times per year    Active Member of [employer] or Organizations: Yes    Attends Music therapist: More than 4 times per year    Marital Status: Married     Family History: The patient's family history includes Alcohol abuse in his maternal grandfather; Arthritis in his father; Cancer in his cousin, cousin, and maternal aunt; Hypertension  in his father and mother; Stroke in his mother.  ROS:   Please see the history of present illness.     All other systems reviewed and are negative.  EKGs/Labs/Other Studies Reviewed:    The following studies were reviewed today:   EKG:  EKG is  ordered today.  The ekg ordered today demonstrates normal sinus rhythm  Recent Labs: 05/11/2021: ALT 24 01/19/2022: BUN 17; Creatinine, Ser 1.25; Potassium 4.3; Sodium 138  Recent Lipid Panel    Component Value Date/Time   CHOL 157 05/11/2021 1023   CHOL 172 12/30/2010 0000   TRIG 119.0 05/11/2021 1023   TRIG 135 12/30/2010 0000   HDL 55.80 05/11/2021 1023   CHOLHDL 3 05/11/2021 1023   VLDL 23.8 05/11/2021 1023   LDLCALC 77 05/11/2021 1023   LDLCALC 96 12/30/2010 0000     Risk Assessment/Calculations:         Physical Exam:    VS:  BP 120/70 (BP Location: Right Arm, Patient Position: Sitting, Cuff Size: Normal)   Pulse 80   Ht '5\' 8"'$  (1.727 m)   Wt 170 lb 2 oz (77.2 kg)   SpO2 97%   BMI 25.87 kg/m     Wt Readings from Last 3 Encounters:  01/19/22 170 lb 2 oz (77.2 kg)  12/26/21 171 lb 6 oz (77.7 kg)  12/02/21 169 lb 9 oz (76.9 kg)     GEN:  Well nourished, well developed in no acute distress HEENT: Normal NECK: No JVD; No carotid bruits LYMPHATICS: No lymphadenopathy CARDIAC: RRR, no murmurs, rubs, gallops RESPIRATORY:  Clear to auscultation without rales, wheezing or rhonchi  ABDOMEN: Soft, non-tender, non-distended MUSCULOSKELETAL:  No edema; No deformity  SKIN: Warm and dry NEUROLOGIC:  Alert and oriented x 3 PSYCHIATRIC:  Normal affect   ASSESSMENT:     1. Precordial pain   2. Primary hypertension    PLAN:    In order of problems listed above:  Chest pain, risk factors hypertension.  Echo 06/2021 normal EF, diastolic function normal.  Get coronary CTA to evaluate presence of CAD. Hypertension, BP controlled.  Continue losartan, amlodipine.  Follow-up after coronary CTA       Medication Adjustments/Labs and Tests Ordered: Current medicines are reviewed at length with the patient today.  Concerns regarding medicines are outlined above.  Orders Placed This Encounter  Procedures   CT CORONARY MORPH W/CTA COR W/SCORE W/CA W/CM &/OR WO/CM   Basic metabolic panel   EKG 68-TMHD   Meds ordered this encounter  Medications   metoprolol tartrate (LOPRESSOR) 100 MG tablet    Sig: Take 1 tablet (100 mg total) by mouth once for 1 dose. Take 2 hours prior to your CT scan.    Dispense:  1 tablet    Refill:  0   ivabradine (CORLANOR) 5 MG TABS tablet    Sig: Take 2 tablets (10 mg total) by mouth once for 1 dose. Take 2 hours prior to your CT scan.    Dispense:  2 tablet    Refill:  0    Patient Instructions  Medication Instructions:   Your physician recommends that you continue on your current medications as directed. Please refer to the Current Medication list given to you today.  *If you need a refill on your cardiac medications before your next appointment, please call your pharmacy*   Lab Work:  Please go to the [facility] after your appointment today for a lab (BMP) draw.   Testing/Procedures:  Your physician  has requested that you have cardiac CT. Cardiac computed tomography (CT) is a painless test that uses an x-ray machine to take clear, detailed pictures of your heart.    Your cardiac CT will be scheduled at:  [facility] [address] [place], [place] [address] [phone]  Thursday 01/26/22 at 10:00 AM  Please arrive 15 mins early for check-in and test  prep.    Please follow these instructions carefully (unless otherwise directed):  Hold all erectile dysfunction medications at least 3 days (72 hrs) prior to test.  On the Night Before the Test: Be sure to Drink plenty of water. Do not consume any caffeinated/decaffeinated beverages or chocolate 12 hours prior to your test.   On the Day of the Test: Drink plenty of water until 1 hour prior to the test. Do not eat any food 4 hours prior to the test. You may take your regular medications prior to the test.  Take metoprolol (Lopressor) 100 MG two hours prior to test. Take Ivabradine (Corlanor) 10 MG two hours prior to test. HOLD your Amlodipine and your Losartan until AFTER your CT scan      After the Test: Drink plenty of water. After receiving IV contrast, you may experience a mild flushed feeling. This is normal. On occasion, you may experience a mild rash up to 24 hours after the test. This is not dangerous. If this occurs, you can take Benadryl 25 mg and increase your fluid intake. If you experience trouble breathing, this can be serious. If it is severe call 911 IMMEDIATELY. If it is mild, please call our office. If you take any of these medications: Glipizide/Metformin, Avandament, Glucavance, please do not take 48 hours after completing test unless otherwise instructed.  Please allow 2-4 weeks for scheduling of routine cardiac CTs. Some insurance companies require a pre-authorization which may delay scheduling of this test.   For non-scheduling related questions, please contact the cardiac imaging nurse navigator should you have any questions/concerns: Marchia Bond, Cardiac Imaging Nurse Navigator Gordy Clement, Cardiac Imaging Nurse Navigator [facility] Direct Office Dial: [phone]   For scheduling needs, including cancellations and rescheduling, please call [place], [phone].     Follow-Up: At [facility], you and your health  needs are our priority.  As part of our continuing mission to provide you with exceptional heart care, we have created designated Provider Care Teams.  These Care Teams include your primary Cardiologist (physician) and Advanced Practice Providers (APPs -  Physician Assistants and Nurse Practitioners) who all work together to provide you with the care you need, when you need it.  We recommend signing up for the patient portal called "MyChart".  Sign up information is provided on this After Visit Summary.  MyChart is used to connect with patients for Virtual Visits (Telemedicine).  Patients are able to view lab/test results, encounter notes, upcoming appointments, etc.  Non-urgent messages can be sent to your provider as well.   To learn more about what you can do with MyChart, go to [email or web address].    Your next appointment:   Follow up after testing   The format for your next appointment:   In Person  Provider:   You may see Kate Sable, MD or one of the following Advanced Practice Providers on your designated Care Team:   Murray Hodgkins, NP Christell Faith, PA-C Cadence Kathlen Mody, [place]    Other Instructions   Important Information About Sugar  Signed, Kate Sable, MD  01/19/2022 9:45 AM    [place]

## 2022-01-19 NOTE — Patient Instructions (Signed)
Medication Instructions:   Your physician recommends that you continue on your current medications as directed. Please refer to the Current Medication list given to you today.  *If you need a refill on your cardiac medications before your next appointment, please call your pharmacy*   Lab Work:  Please go to the [facility] after your appointment today for a lab (BMP) draw.   Testing/Procedures:  Your physician has requested that you have cardiac CT. Cardiac computed tomography (CT) is a painless test that uses an x-ray machine to take clear, detailed pictures of your heart.    Your cardiac CT will be scheduled at:  [facility] [address] [place], [place] [address] [phone]  Thursday 01/26/22 at 10:00 AM  Please arrive 15 mins early for check-in and test prep.    Please follow these instructions carefully (unless otherwise directed):  Hold all erectile dysfunction medications at least 3 days (72 hrs) prior to test.  On the Night Before the Test: Be sure to Drink plenty of water. Do not consume any caffeinated/decaffeinated beverages or chocolate 12 hours prior to your test.   On the Day of the Test: Drink plenty of water until 1 hour prior to the test. Do not eat any food 4 hours prior to the test. You may take your regular medications prior to the test.  Take metoprolol (Lopressor) 100 MG two hours prior to test. Take Ivabradine (Corlanor) 10 MG two hours prior to test. HOLD your Amlodipine and your Losartan until AFTER your CT scan      After the Test: Drink plenty of water. After receiving IV contrast, you may experience a mild flushed feeling. This is normal. On occasion, you may experience a mild rash up to 24 hours after the test. This is not dangerous. If this occurs, you can take Benadryl 25 mg and increase your fluid intake. If you experience trouble breathing, this can be serious. If it is severe call  911 IMMEDIATELY. If it is mild, please call our office. If you take any of these medications: Glipizide/Metformin, Avandament, Glucavance, please do not take 48 hours after completing test unless otherwise instructed.  Please allow 2-4 weeks for scheduling of routine cardiac CTs. Some insurance companies require a pre-authorization which may delay scheduling of this test.   For non-scheduling related questions, please contact the cardiac imaging nurse navigator should you have any questions/concerns: Marchia Bond, Cardiac Imaging Nurse Navigator Gordy Clement, Cardiac Imaging Nurse Navigator [facility] Direct Office Dial: [phone]   For scheduling needs, including cancellations and rescheduling, please call [place], [phone].     Follow-Up: At [facility], you and your health needs are our priority.  As part of our continuing mission to provide you with exceptional heart care, we have created designated Provider Care Teams.  These Care Teams include your primary Cardiologist (physician) and Advanced Practice Providers (APPs -  Physician Assistants and Nurse Practitioners) who all work together to provide you with the care you need, when you need it.  We recommend signing up for the patient portal called "MyChart".  Sign up information is provided on this After Visit Summary.  MyChart is used to connect with patients for Virtual Visits (Telemedicine).  Patients are able to view lab/test results, encounter notes, upcoming appointments, etc.  Non-urgent messages can be sent to your provider as well.   To learn more about what you can do with MyChart, go to [email or web address].  Your next appointment:   Follow up after testing   The format for your next appointment:   In Person  Provider:   You may see Kate Sable, MD or one of the following Advanced Practice Providers on your designated Care Team:   Murray Hodgkins, NP Christell Faith,  PA-C Cadence Kathlen Mody, [place]    Other Instructions   Important Information About Sugar

## 2022-01-24 ENCOUNTER — Telehealth (HOSPITAL_COMMUNITY): Payer: Self-pay | Admitting: Emergency Medicine

## 2022-01-24 NOTE — Telephone Encounter (Signed)
Reaching out to patient to offer assistance regarding upcoming cardiac imaging study; pt verbalizes understanding of appt date/time, parking situation and where to check in, pre-test NPO status and medications ordered, and verified current allergies; name and call back number provided for further questions should they arise Marchia Bond RN Navigator Cardiac Imaging [facility] [phone] office [phone] cell  '100mg'$  metoprolol + '10mg'$  ivabradine Denies iv issues Reminded to hold cialis from now til test Arrival 1000

## 2022-01-26 ENCOUNTER — Ambulatory Visit
Admission: RE | Admit: 2022-01-26 | Discharge: 2022-01-26 | Disposition: A | Discharge status: Home / Self Care | Payer: PPO | Source: Ambulatory Visit | Attending: Cardiology | Admitting: Cardiology

## 2022-01-26 DIAGNOSIS — R072 Precordial pain: Secondary | ICD-10-CM

## 2022-01-26 MED ORDER — IOHEXOL 350 MG/ML SOLN
75.0000 mL | Freq: Once | INTRAVENOUS | Status: AC | PRN
  Administered 2022-01-26: 75 mL via INTRAVENOUS

## 2022-01-26 MED ORDER — NITROGLYCERIN 0.4 MG SL SUBL
0.8000 mg | SUBLINGUAL_TABLET | Freq: Once | SUBLINGUAL | Status: AC
  Administered 2022-01-26: 0.8 mg via SUBLINGUAL

## 2022-01-26 NOTE — Progress Notes (Signed)
Patient tolerated CT well. Drank water after. Vital signs stable encourage to drink water throughout day.Reasons explained and verbalized understanding. Ambulated steady gait.  

## 2022-01-30 ENCOUNTER — Telehealth: Payer: Self-pay | Admitting: Cardiology

## 2022-01-30 MED ORDER — ASPIRIN 81 MG PO TBEC: 81.0000 mg | DELAYED_RELEASE_TABLET | Freq: Every day | ORAL | Status: AC

## 2022-01-30 NOTE — Telephone Encounter (Signed)
Patient made aware of Cardiac CTA results and Dr. Thereasa Solo recommendation with verbalized understanding.

## 2022-01-30 NOTE — Telephone Encounter (Signed)
Patient returned call

## 2022-01-30 NOTE — Telephone Encounter (Signed)
Attempted to call the patient. No answer- I left a message to please call back.  

## 2022-01-30 NOTE — Telephone Encounter (Signed)
Kate Sable, MD  01/26/2022  4:07 PM EDT     Mild nonobstructive coronary artery disease noted on coronary CTA.  Recommend starting aspirin 81 mg daily.

## 2022-02-09 NOTE — Progress Notes (Signed)
Cardiology Clinic Note   Patient Name: Aaron Brown. Date of Encounter: 02/10/2022  Primary Care Provider:  Ria Bush, MD Primary Cardiologist:  Kate Sable, MD  Patient Profile    76 year old male with a past medical history of asymmetrical left neurosensory hearing loss, basal cell carcinoma, prostate cancer, cerebral microvascular disease, hypertension, prediabetes, and seasonal allergies, who is here today to follow-up after having outpatient testing completed for previous complaints of chest pain.  Past Medical History    Past Medical History:  Diagnosis Date   Actinic keratosis    Asymmetrical left sensorineural hearing loss 12/2013   s/p MRI pending hearing aides [name])   Basal cell carcinoma 10/21/2019   L ala crease   Basal cell carcinoma 11/11/2018   R chin/excision   Basal cell carcinoma 03/12/2018   L mid forearm   Basal cell carcinoma 05/30/2016   R inf clavicle, L lateral canthus   Basal cell carcinoma 01/06/2014   L elbow   Basal cell carcinoma 11/02/2020   L mid forearm, recurrent. Exc 12/27/2020.   Blood in stool    in the past; ? hemorrhoids   Cerebral microvascular disease 12/2013   by MRI, referred to neuro by ENT   Colon polyp 05/2005   ?hyperplastic   History of chicken pox    Hypertension    Increased pressure in the eye    Dr. Clayborne Artist heart valve    Lower back pain    bulging disc with spinal stenosis L4/5 s/p surgery x2   Prediabetes    A1c 6.0 (2012)   Seasonal allergies    Squamous acanthoma of face    Squamous cell cancer of skin of hand 10/22/2018   R hand dorsum below first webspace   Squamous cell carcinoma of neck 06/26/2013   Right anterior neck/excision   Squamous cell carcinoma of skin 11/17/2019   Left dorsal hand. SCCis hypertrophic.   Past Surgical History:  Procedure Laterality Date   BUNIONECTOMY  2003   Dr. Barkley Bruns   COLONOSCOPY  05/2005   1 polyp, int hem    DOBUTAMINE STRESS ECHO   2013   normal per prior PCP records   hemi-laminectomy  2000   Dr. Annette Stable, Dr. Hal Neer L4/L5   LAMINECTOMY  1988   Dr. Pearlie Oyster; L5   SQUAMOUS CELL CARCINOMA EXCISION  2014   face and hand    Allergies  Allergies  Allergen Reactions   Hydrochlorothiazide Other (See Comments)    Mild drop in sodium   Lisinopril     May have caused lip swelling    History of Present Illness    76 year old male with previously mentioned past medical history of asymmetrical left neurosensory hearing loss, basal cell carcinoma, prostate cancer, cerebral microvascular disease, hypertension, prediabetes, and seasonal allergies.  He previously suffered from an upper respiratory infection a month ago and had 3 weeks of a dry nonproductive cough during that episode he had noticed he had started with left-sided chest discomfort.  Symptoms were not associated with exertion.  He was sent for cardiovascular workup and underwent outpatient testing.  Echocardiogram completed on 06/16/2021 revealed LVEF of 55 to 60%, no regional wall motion abnormalities, and mild mitral regurgitation.  Coronary CT done 01/26/2022 revealed no significant extracardiac vascular findings, there is an average transverse dimension 6.5 mm nodule within the peripheral left lower lobe.  Noncontrast cyst chest CT in 6 to 12 months is recommended, small sliding hiatal hernia, coronary  calcium score 462, 64th percentile for age and sex matched control, normal coronary origin with left dominance, mild stenosis (25-49%) in the proximal LAD, first diagonal artery and OM1, mild nonobstructive CAD, consider nonatherosclerotic causes of chest pain, consider preventative therapy and risk factor modification.  After testing was completed he was recommended to start baby aspirin 81 mg daily.  He returns to clinic today stating that he has been doing fairly well.  They have just returned from vacation.  He states that he occasionally does have twinges of chest  discomfort they are not related to exertion typically, with at rest.  He states they are very short-lived and do not prevent him from doing any of his ADLs.  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  He denies any shortness of breath or palpitations.  He was recently undergone coronary CT for his atypical chest pain.  Home Medications    Current Outpatient Medications  Medication Sig Dispense Refill   amLODipine (NORVASC) 5 MG tablet Take 1 tablet (5 mg total) by mouth daily. 90 tablet 3   aspirin EC 81 MG tablet Take 1 tablet (81 mg total) by mouth daily. Swallow whole.     beta carotene w/minerals (OCUVITE) tablet Take 1 tablet by mouth daily.     cholecalciferol (VITAMIN D) 1000 UNITS tablet Take 1,000 Units by mouth daily.     cyclobenzaprine (FLEXERIL) 10 MG tablet Take 1 tablet (10 mg total) by mouth 2 (two) times daily as needed for muscle spasms. 40 tablet 1   etodolac (LODINE) 400 MG tablet TAKE 1 TABLET (400 MG TOTAL) BY MOUTH 2 (TWO) TIMES DAILY AS NEEDED. 60 tablet 1   losartan (COZAAR) 100 MG tablet Take 1 tablet (100 mg total) by mouth daily. 90 tablet 1   LUTEIN PO Take by mouth.     Multiple Vitamins-Minerals (LUTEIN-ZEAXANTHIN PO) Take by mouth.     tadalafil (CIALIS) 20 MG tablet Take 1 tab 1 hour prior to intercourse. 10 tablet 0   vitamin B-12 (CYANOCOBALAMIN) 1000 MCG tablet Take 1,000 mcg by mouth daily.     No current facility-administered medications for this visit.     Family History    Family History  Problem Relation Age of Onset   Hypertension Mother    Stroke Mother    Arthritis Father    Hypertension Father    Cancer Maternal Aunt        colon   Alcohol abuse Maternal Grandfather    Cancer Cousin        colon   Cancer Cousin        breast   He indicated that his mother is deceased. He indicated that his father is deceased. He indicated that his sister is alive. He indicated that the status of his maternal  grandfather is unknown. He indicated that the status of his maternal aunt is unknown.  Social History    Social History   Socioeconomic History   Marital status: Married    Spouse name: Not on file   Number of children: Not on file   Years of education: Not on file   Highest education level: Not on file  Occupational History   Not on file  Tobacco Use   Smoking status: Never   Smokeless tobacco: Never  Vaping Use   Vaping Use: Never used  Substance and Sexual Activity   Alcohol use: Yes    Alcohol/week: 0.0 standard drinks  of alcohol    Comment: Occasional-wine   Drug use: No   Sexual activity: Yes  Other Topics Concern   Not on file  Social History Narrative   Lives with wife   grown children   Occupation: was Government social research officer, now owns landscaping business   Edu: Sports administrator   Activity: stays active at work   Diet: good water, fruits/vegetables some   Social Determinants of Radio broadcast assistant Strain: [place]  (05/04/2021)   Overall Financial Resource Strain (CARDIA)    Difficulty of Paying Living Expenses: Not hard at all  Food Insecurity: No Food Insecurity (05/04/2021)   Hunger Vital Sign    Worried About Running Out of Food in the Last Year: Never true    [place] in the Last Year: Never true  Transportation Needs: No Transportation Needs (05/04/2021)   PRAPARE - Hydrologist (Medical): No    Lack of Transportation (Non-Medical): No  Physical Activity: Sufficiently Active (05/04/2021)   Exercise Vital Sign    Days of Exercise per Week: 7 days    Minutes of Exercise per Session: 40 min  Stress: No Stress Concern Present (05/04/2021)   [place]    Feeling of Stress : Not at all  Social Connections: [place] (05/04/2021)   Social Connection and Isolation Panel [NHANES]    Frequency of Communication with Friends and Family: Three times a week     Frequency of Social Gatherings with Friends and Family: Once a week    Attends Religious Services: More than 4 times per year    Active Member of [employer] or Organizations: Yes    Attends Music therapist: More than 4 times per year    Marital Status: Married  Human resources officer Violence: Not At Risk (05/04/2021)   Humiliation, Afraid, Rape, and Kick questionnaire    Fear of Current or Ex-Partner: No    Emotionally Abused: No    Physically Abused: No    Sexually Abused: No     Review of Systems    General:  No chills, fever, night sweats or weight changes.  Cardiovascular:  No chest pain, dyspnea on exertion, edema, orthopnea, palpitations, paroxysmal nocturnal dyspnea.  Occasional muscle spasms to the left chest Dermatological: No rash, lesions/masses Respiratory: No cough, dyspnea Urologic: No hematuria, dysuria Abdominal:   No nausea, vomiting, diarrhea, bright red blood per rectum, melena, or hematemesis Neurologic:  No visual changes, wkns, changes in mental status. All other systems reviewed and are otherwise negative except as noted above.     Physical Exam    VS:  BP 132/74 (BP Location: Left Arm, Patient Position: Sitting, Cuff Size: Normal)   Pulse 80   Ht '5\' 8"'$  (1.727 m)   Wt 171 lb 6.4 oz (77.7 kg)   SpO2 98%   BMI 26.06 kg/m  , BMI Body mass index is 26.06 kg/m.     GEN: Well nourished, well developed, in no acute distress. HEENT: normal. Neck: Supple, no JVD, carotid bruits, or masses. Cardiac: RRR, no murmurs, rubs, or gallops. No clubbing, cyanosis, edema.  Radials/DP/PT 2+ and equal bilaterally.  Respiratory:  Respirations regular and unlabored, clear to auscultation bilaterally. GI: Soft, nontender, nondistended, BS + x 4. MS: no deformity or atrophy. Skin: warm and dry, no rash. Neuro:  Strength and sensation are intact. Psych: Normal affect.  Accessory Clinical Findings    ECG personally reviewed  by me today-no new tracings were completed  today  Lab Results  Component Value Date   WBC 5.3 11/04/2018   HGB 14.3 11/04/2018   HCT 40.0 11/04/2018   MCV 91.8 11/04/2018   PLT 210.0 11/04/2018   Lab Results  Component Value Date   CREATININE 1.25 (H) 01/19/2022   BUN 17 01/19/2022   NA 138 01/19/2022   K 4.3 01/19/2022   CL 105 01/19/2022   CO2 26 01/19/2022   Lab Results  Component Value Date   ALT 24 05/11/2021   AST 37 05/11/2021   ALKPHOS 52 05/11/2021   BILITOT 0.6 05/11/2021   Lab Results  Component Value Date   CHOL 157 05/11/2021   HDL 55.80 05/11/2021   LDLCALC 77 05/11/2021   TRIG 119.0 05/11/2021   CHOLHDL 3 05/11/2021    Lab Results  Component Value Date   HGBA1C 6.1 05/11/2021    Assessment & Plan   1.  Precordial pain which has mostly resolved.  The patient states he only has occasional twinges of pain usually with rest and none noted with exertion.  He does do manual labor on the farm and is relatively active.  He did undergo coronary CTA which revealed mild nonobstructive CAD (25-49%).    2.  Mild nonobstructive coronary artery disease noted on coronary CTA which revealed mild nonobstructive CAD (25-49%).  Consider nonatherosclerotic causes of chest pain.  Consider preventative therapy and risk factor modification.  Coronary calcium score 492 that is a 64th percentile for age and sex matched controls.  There was an average transverse dimension 6.5 mm nodule within the peripheral of the left lower lobe.  A noncontrasted CT at 6 to 12 months was recommended.  With reduction in symptoms patient will start aspirin 81 mg daily.  3.  Hypertension with blood pressure today 132/74.  Blood pressure is well-controlled . He is to continue on losartan 100 mg daily and amlodipine 5 mg daily.  4.  Disposition Patient to return to office in follow-up with MD/APP in 3 months he will also need to have a CT of his chest in the next 6 to 12 months for the pulmonary nodule that was found on his coronary  CT  Krystel Fletchall, NP 02/10/2022, 9:11 AM

## 2022-02-10 ENCOUNTER — Encounter: Payer: Self-pay | Admitting: Cardiology

## 2022-02-10 ENCOUNTER — Ambulatory Visit: Payer: PPO | Admitting: Cardiology

## 2022-02-10 VITALS — BP 132/74 | HR 80 | Ht 68.0 in | Wt 171.4 lb

## 2022-02-10 DIAGNOSIS — R072 Precordial pain: Secondary | ICD-10-CM | POA: Diagnosis not present

## 2022-02-10 DIAGNOSIS — I10 Essential (primary) hypertension: Secondary | ICD-10-CM | POA: Diagnosis not present

## 2022-02-10 DIAGNOSIS — I25118 Atherosclerotic heart disease of native coronary artery with other forms of angina pectoris: Secondary | ICD-10-CM | POA: Diagnosis not present

## 2022-02-10 DIAGNOSIS — R911 Solitary pulmonary nodule: Secondary | ICD-10-CM | POA: Diagnosis not present

## 2022-02-10 NOTE — Patient Instructions (Signed)
Medication Instructions:  Your physician recommends that you continue on your current medications as directed. Please refer to the Current Medication list given to you today.  *If you need a refill on your cardiac medications before your next appointment, please call your pharmacy*   Lab Work: None ordered  If you have labs (blood work) drawn today and your tests are completely normal, you will receive your results only by: [place] (if you have MyChart) OR A paper copy in the mail If you have any lab test that is abnormal or we need to change your treatment, we will call you to review the results.   Testing/Procedures: None ordered   Follow-Up: At [facility], you and your health needs are our priority.  As part of our continuing mission to provide you with exceptional heart care, we have created designated Provider Care Teams.  These Care Teams include your primary Cardiologist (physician) and Advanced Practice Providers (APPs -  Physician Assistants and Nurse Practitioners) who all work together to provide you with the care you need, when you need it.  We recommend signing up for the patient portal called "MyChart".  Sign up information is provided on this After Visit Summary.  MyChart is used to connect with patients for Virtual Visits (Telemedicine).  Patients are able to view lab/test results, encounter notes, upcoming appointments, etc.  Non-urgent messages can be sent to your provider as well.   To learn more about what you can do with MyChart, go to [email or web address].    Your next appointment:   3 month(s)  The format for your next appointment:   In Person  Provider:   You may see Kate Sable, MD or one of the following Advanced Practice Providers on your designated Care Team:   Murray Hodgkins, NP Christell Faith, PA-C Cadence Kathlen Mody, [place]    Other Instructions N/A  Important Information About Sugar

## 2022-03-15 ENCOUNTER — Other Ambulatory Visit: Payer: Self-pay | Admitting: *Deleted

## 2022-03-15 DIAGNOSIS — C61 Malignant neoplasm of prostate: Secondary | ICD-10-CM

## 2022-03-23 ENCOUNTER — Ambulatory Visit: Payer: PPO | Admitting: Internal Medicine

## 2022-03-23 ENCOUNTER — Encounter: Payer: Self-pay | Admitting: Internal Medicine

## 2022-03-23 ENCOUNTER — Other Ambulatory Visit: Payer: PPO

## 2022-03-23 ENCOUNTER — Ambulatory Visit: Payer: PPO | Admitting: Urology

## 2022-03-23 VITALS — BP 120/70 | HR 81 | Temp 98.0°F | Ht 68.0 in | Wt 173.0 lb

## 2022-03-23 DIAGNOSIS — Z23 Encounter for immunization: Secondary | ICD-10-CM | POA: Diagnosis not present

## 2022-03-23 DIAGNOSIS — R911 Solitary pulmonary nodule: Secondary | ICD-10-CM | POA: Insufficient documentation

## 2022-03-23 DIAGNOSIS — R058 Other specified cough: Secondary | ICD-10-CM | POA: Diagnosis not present

## 2022-03-23 DIAGNOSIS — C61 Malignant neoplasm of prostate: Secondary | ICD-10-CM | POA: Diagnosis not present

## 2022-03-23 DIAGNOSIS — I10 Essential (primary) hypertension: Secondary | ICD-10-CM | POA: Diagnosis not present

## 2022-03-23 MED ORDER — BENZONATATE 200 MG PO CAPS: 200.0000 mg | ORAL_CAPSULE | Freq: Three times a day (TID) | ORAL | 1 refills | Status: DC | PRN

## 2022-03-23 MED ORDER — FAMOTIDINE 20 MG PO TABS: ORAL_TABLET | ORAL | 11 refills | Status: DC

## 2022-03-23 MED ORDER — PREDNISONE 10 MG PO TABS: ORAL_TABLET | ORAL | 0 refills | Status: DC

## 2022-03-23 MED ORDER — VALSARTAN 160 MG PO TABS: 160.0000 mg | ORAL_TABLET | Freq: Every day | ORAL | 11 refills | Status: DC

## 2022-03-23 MED ORDER — PANTOPRAZOLE SODIUM 40 MG PO TBEC: 40.0000 mg | DELAYED_RELEASE_TABLET | Freq: Every day | ORAL | 2 refills | Status: DC

## 2022-03-23 NOTE — Assessment & Plan Note (Signed)
Changed losartan to valsartan 160 mg one daily 03/23/2022  due to chronic cough   For reasons that may related to vascular permeability and nitric oxide pathways but not elevated  bradykinin levels (as seen with  ACEi use) losartan in the generic form has been reported now from mulitple sources  to cause a similar pattern of non-specific  upper airway symptoms as seen with acei.   This has not been reported with exposure to the other ARB's to date, so it seems reasonable for now to try either generic diovan or avapro if ARB needed or use an alternative class altogether.  See:  Lelon Frohlich Allergy Asthma Immunol  2008: 101: p [phone]    rec change to valsartan as above, as long as cough persists then ok to rechallenge with losartan if prefers

## 2022-03-23 NOTE — Assessment & Plan Note (Signed)
Onset mid May 2023  -  75% better p abx and prednisone -   7/42/5956 cyclical cough rx with tessalon /delsym/ gerd rx and pred x 6 days  Of the three most common causes of  Sub-acute / recurrent or chronic cough, only one (GERD)  can actually contribute to/ trigger  the other two (asthma and post nasal drip syndrome)  and perpetuate the cylce of cough.  While not intuitively obvious, many patients with chronic low grade reflux do not cough until there is a primary insult that disturbs the protective epithelial barrier and exposes sensitive nerve endings.   This is typically viral but can due to PNDS and  either may apply here.    >>>  The point is that once this occurs, it is difficult to eliminate the cycle  using anything but a maximally effective acid suppression regimen at least in the short run, accompanied by an appropriate diet to address non acid GERD and control / eliminate the cough itself for at least 3 days with delsym and tessalon >>> also so added 6 day taper off  Prednisone starting at 40 mg per day in case of component of Th-2 driven upper or lower airways inflammation (if cough responds short term only to relapse before return while will on full rx for uacs (as above), then  that would point to allergic rhinitis/ asthma or eos bronchitis as alternative dx)   F/u in 4-6 weeks, call sooner prn with ent f/u next step if not responding 100%

## 2022-03-23 NOTE — Assessment & Plan Note (Addendum)
Chest CT cuts on coronary study 01/26/22  Average transverse dimension 6.5 mm nodule within the peripheral left lower lobe.   CT results reviewed with pt >>> Too small for PET or bx, not suspicious enough for excisional bx > really only option for now is follow the Fleischner society guidelines as rec by radiology >> already arranged per pt, f/u here prn   Discussed in detail all the  indications, usual  risks and alternatives  relative to the benefits with patient who agrees to proceed with w/u as outlined.            Each maintenance medication was reviewed in detail including emphasizing most importantly the difference between maintenance and prns and under what circumstances the prns are to be triggered using an action plan format where appropriate.  Total time for H and P, chart review, counseling,   and generating customized AVS unique to this office visit / same day charting = 45 min new pt eval

## 2022-03-23 NOTE — Patient Instructions (Addendum)
Change losartan to valsartan 160 mg on daily   The key to effective treatment for your cough is eliminating the non-stop cycle of cough you're stuck in long enough to let your airway heal completely and then see if there is anything still making you cough once you stop the cough suppression, but this should take no more than 5 days to figure out  First take delsym two tsp every 12 hours and supplement if needed with  tessalon every 4 hours to suppress the urge to cough at all or even clear your throat. Swallowing water or using ice chips/non mint and menthol containing candies (such as lifesavers or sugarless jolly ranchers) are also effective.  You should rest your voice and avoid activities that you know make you cough.  Once you have eliminated the cough for 3 straight days try reducing the tessalon first,  then the delsym as tolerated.    Prednisone 10 mg take  4 each am x 2 days,   2 each am x 2 days,  1 each am x 2 days and stop (this is to eliminate allergies and inflammation from coughing)  Protonix (pantoprazole) Take 30-60 min before first meal of the day and Pepcid 20 mg one after supper until return to office   GERD (REFLUX)  is an extremely common cause of respiratory symptoms, many times with no significant heartburn at all.    It can be treated with medication, but also with lifestyle changes including avoidance of late meals, excessive alcohol, smoking cessation, and avoid fatty foods, chocolate, peppermint, colas, red wine, and acidic juices such as orange juice.  NO MINT OR MENTHOL PRODUCTS SO NO COUGH DROPS  USE HARD CANDY INSTEAD (jolley ranchers or Stover's or Lifesavers (all available in sugarless versions) NO OIL BASED VITAMINS - use powdered substitutes.   Please schedule a follow up office visit in 4 weeks, sooner if needed

## 2022-03-23 NOTE — Progress Notes (Signed)
Aaron Brown., male    DOB: 14-Jul-1945   MRN: [id number]   Brief patient profile:  43  yowm never smoker  self- referred to pulmonary clinic in [facility]  03/23/2022    History of Present Illness  03/23/2022  Pulmonary/ 1st office eval/ Kean Gautreau / [facility]  Chief Complaint  Patient presents with   Consult    Had a virus in May (while at the beach), after a few weeks he went to the doctor and was put on prednisone and antibiotic for strep.  He now has a dry cough for the most part.  Dyspnea:  even if not coughing feels sob doing yard work  Cough: onset w/in 3 days p exp at [employer] got sensation of pnd and coughing fits mostly while awake wife had same problem rx with abx / prednisone seemed 75% transiently then stayed there with hypersensitivity to dust in air, vent blowing on him not with perfumes, singing.  Maybe laughing  Sleep: no noct  SABA use: none  Has had difficulty breathing thru L nose x years   No obvious day to day or daytime pattern/variability or assoc excess/ purulent sputum or mucus plugs or hemoptysis or cp or chest tightness, subjective wheeze or overt sinus or hb symptoms.   Sleeping  without nocturnal  or early am exacerbation  of respiratory  c/o's or need for noct saba. Also denies any obvious fluctuation of symptoms with weather or environmental changes or other aggravating or alleviating factors except as outlined above   No unusual exposure hx or h/o childhood pna/ asthma or knowledge of premature birth.  Current Allergies, Complete Past Medical History, Past Surgical History, Family History, and Social History were reviewed in [employer] record.  ROS  The following are not active complaints unless bolded Hoarseness, sore throat/globus, dysphagia, dental problems, itching, sneezing,  nasal congestion or discharge of excess mucus or purulent secretions, ear ache,   fever, chills, sweats, unintended wt loss or wt gain,  classically pleuritic or exertional cp,  orthopnea pnd or arm/hand swelling  or leg swelling, presyncope, palpitations, abdominal pain, anorexia, nausea, vomiting, diarrhea  or change in bowel habits or change in bladder habits, change in stools or change in urine, dysuria, hematuria,  rash, arthralgias, visual complaints, headache, numbness, weakness or ataxia or problems with walking or coordination,  change in mood or  memory.             Past Medical History:  Diagnosis Date   Actinic keratosis    Asymmetrical left sensorineural hearing loss 12/2013   s/p MRI pending hearing aides [name])   Basal cell carcinoma 10/21/2019   L ala crease   Basal cell carcinoma 11/11/2018   R chin/excision   Basal cell carcinoma 03/12/2018   L mid forearm   Basal cell carcinoma 05/30/2016   R inf clavicle, L lateral canthus   Basal cell carcinoma 01/06/2014   L elbow   Basal cell carcinoma 11/02/2020   L mid forearm, recurrent. Exc 12/27/2020.   Blood in stool    in the past; ? hemorrhoids   Cerebral microvascular disease 12/2013   by MRI, referred to neuro by ENT   Colon polyp 05/2005   ?hyperplastic   History of chicken pox    Hypertension    Increased pressure in the eye    Dr. Clayborne Artist heart valve    Lower back pain    bulging disc with spinal stenosis  L4/5 s/p surgery x2   Prediabetes    A1c 6.0 (2012)   Seasonal allergies    Squamous acanthoma of face    Squamous cell cancer of skin of hand 10/22/2018   R hand dorsum below first webspace   Squamous cell carcinoma of neck 06/26/2013   Right anterior neck/excision   Squamous cell carcinoma of skin 11/17/2019   Left dorsal hand. SCCis hypertrophic.    Outpatient Medications Prior to Visit  Medication Sig Dispense Refill   amLODipine (NORVASC) 5 MG tablet Take 1 tablet (5 mg total) by mouth daily. 90 tablet 3   aspirin EC 81 MG tablet Take 1 tablet (81 mg total) by mouth daily. Swallow whole.     beta carotene w/minerals  (OCUVITE) tablet Take 1 tablet by mouth daily.     cholecalciferol (VITAMIN D) 1000 UNITS tablet Take 1,000 Units by mouth daily.     cyclobenzaprine (FLEXERIL) 10 MG tablet Take 1 tablet (10 mg total) by mouth 2 (two) times daily as needed for muscle spasms. 40 tablet 1   etodolac (LODINE) 400 MG tablet TAKE 1 TABLET (400 MG TOTAL) BY MOUTH 2 (TWO) TIMES DAILY AS NEEDED. 60 tablet 1   losartan (COZAAR) 100 MG tablet Take 1 tablet (100 mg total) by mouth daily. 90 tablet 1   tadalafil (CIALIS) 20 MG tablet Take 1 tab 1 hour prior to intercourse. 10 tablet 0   vitamin B-12 (CYANOCOBALAMIN) 1000 MCG tablet Take 1,000 mcg by mouth daily. (Patient not taking: Reported on 03/23/2022)     LUTEIN PO Take by mouth. (Patient not taking: Reported on 03/23/2022)     Multiple Vitamins-Minerals (LUTEIN-ZEAXANTHIN PO) Take by mouth. (Patient not taking: Reported on 03/23/2022)     No facility-administered medications prior to visit.     Objective:     BP 120/70 (BP Location: Right Arm, Patient Position: Sitting, Cuff Size: Normal)   Pulse 81   Temp 98 F (36.7 C) (Oral)   Ht '5\' 8"'$  (1.727 m)   Wt 173 lb (78.5 kg)   SpO2 95%   BMI 26.30 kg/m   SpO2: 95 %  Pleasant amb wm slt raspy voice / occ throat clearing/ dry cough     HEENT : Oropharynx  pristine     Nasal turbinates nl    NECK :  without  apparent JVD/ palpable Nodes/TM    LUNGS: no acc muscle use,  Nl contour chest which is clear to A and P bilaterally without cough on insp or exp maneuvers   CV:  RRR  no s3 or murmur or increase in P2, and no edema   ABD:  soft and nontender with nl inspiratory excursion in the supine position. No bruits or organomegaly appreciated   MS:  Nl gait/ ext warm without deformities Or obvious joint restrictions  calf tenderness, cyanosis or clubbing    SKIN: warm and dry without lesions    NEURO:  alert, approp, nl sensorium with  no motor or cerebellar deficits apparent.    I personally reviewed  images and agree with radiology impression as follows:   Chest CT cuts on coronary study 01/26/22  Average transverse dimension 6.5 mm nodule within the peripheral left lower lobe. Non-contrast chest CT at 6-12 months is recommended. If the nodule is stable at time of repeat CT, then future CT at 18-24 months (from today's scan) is considered optional for low-risk patients      Assessment   Upper airway cough syndrome Onset  mid May 2023  -  75% better p abx and prednisone -   10/24/5359 cyclical cough rx with tessalon /delsym/ gerd rx and pred x 6 days  Of the three most common causes of  Sub-acute / recurrent or chronic cough, only one (GERD)  can actually contribute to/ trigger  the other two (asthma and post nasal drip syndrome)  and perpetuate the cylce of cough.  While not intuitively obvious, many patients with chronic low grade reflux do not cough until there is a primary insult that disturbs the protective epithelial barrier and exposes sensitive nerve endings.   This is typically viral but can due to PNDS and  either may apply here.    >>>  The point is that once this occurs, it is difficult to eliminate the cycle  using anything but a maximally effective acid suppression regimen at least in the short run, accompanied by an appropriate diet to address non acid GERD and control / eliminate the cough itself for at least 3 days with delsym and tessalon >>> also so added 6 day taper off  Prednisone starting at 40 mg per day in case of component of Th-2 driven upper or lower airways inflammation (if cough responds short term only to relapse before return while will on full rx for uacs (as above), then  that would point to allergic rhinitis/ asthma or eos bronchitis as alternative dx)   F/u in 4-6 weeks, call sooner prn with ent f/u next step if not responding 100%  Essential hypertension Changed losartan to valsartan 160 mg one daily 03/23/2022  due to chronic cough   For reasons that may  related to vascular permeability and nitric oxide pathways but not elevated  bradykinin levels (as seen with  ACEi use) losartan in the generic form has been reported now from mulitple sources  to cause a similar pattern of non-specific  upper airway symptoms as seen with acei.   This has not been reported with exposure to the other ARB's to date, so it seems reasonable for now to try either generic diovan or avapro if ARB needed or use an alternative class altogether.  See:  Lelon Frohlich Allergy Asthma Immunol  2008: 101: p 495-499    rec change to valsartan as above, as long as cough persists then ok to rechallenge with losartan if prefers  Solitary pulmonary nodule on lung CT  Chest CT cuts on coronary study 01/26/22  Average transverse dimension 6.5 mm nodule within the peripheral left lower lobe.   CT results reviewed with pt >>> Too small for PET or bx, not suspicious enough for excisional bx > really only option for now is follow the Fleischner society guidelines as rec by radiology >> already arranged per pt, f/u here prn   Discussed in detail all the  indications, usual  risks and alternatives  relative to the benefits with patient who agrees to proceed with w/u as outlined.            Each maintenance medication was reviewed in detail including emphasizing most importantly the difference between maintenance and prns and under what circumstances the prns are to be triggered using an action plan format where appropriate.  Total time for H and P, chart review, counseling,   and generating customized AVS unique to this office visit / same day charting = 45 min new pt eval          Christinia Gully, MD 03/23/2022

## 2022-03-24 LAB — PSA: Prostate Specific Ag, Serum: 4.4 ng/mL — ABNORMAL HIGH (ref 0.0–4.0)

## 2022-03-29 ENCOUNTER — Ambulatory Visit: Payer: PPO | Admitting: Urology

## 2022-04-03 ENCOUNTER — Encounter: Payer: Self-pay | Admitting: Urology

## 2022-04-03 ENCOUNTER — Ambulatory Visit: Payer: PPO | Admitting: Urology

## 2022-04-03 VITALS — BP 121/74 | HR 83 | Ht 68.0 in | Wt 168.0 lb

## 2022-04-03 DIAGNOSIS — C61 Malignant neoplasm of prostate: Secondary | ICD-10-CM | POA: Diagnosis not present

## 2022-04-03 DIAGNOSIS — N5201 Erectile dysfunction due to arterial insufficiency: Secondary | ICD-10-CM

## 2022-04-03 MED ORDER — TADALAFIL 20 MG PO TABS: ORAL_TABLET | ORAL | 3 refills | Status: DC

## 2022-04-03 NOTE — Progress Notes (Signed)
04/03/2022 2:49 PM   Aaron Brown. [birth year] [id number]  Referring provider: Ria Bush, MD [address] [place],  [place] [address]  Chief Complaint  Patient presents with   Prostate Cancer    Urologic history: 1.  Prostate cancer T1c low risk Fusion biopsy 08/2020 PI-RADS 4/PI-RADS 3 lesions; 46 g gland PSA 5.1 ROI lesions benign Gleason 3+3 adenocarcinoma RLM, RA, RLA (5%, 20%, 5%)   HPI: 76 y.o. male presents for 24-monthfollow-up.  Doing well since last visit No bothersome LUTS Denies dysuria, gross hematuria Denies flank, abdominal or pelvic pain PSA 03/23/2022 stable at 4.4 Was given a trial of tadalafil for ED last visit which has been effective and he is requesting a refill  PMH: Past Medical History:  Diagnosis Date   Actinic keratosis    Asymmetrical left sensorineural hearing loss 12/2013   s/p MRI pending hearing aides (Aaron Brown   Basal cell carcinoma 10/21/2019   L ala crease   Basal cell carcinoma 11/11/2018   R chin/excision   Basal cell carcinoma 03/12/2018   L mid forearm   Basal cell carcinoma 05/30/2016   R inf clavicle, L lateral canthus   Basal cell carcinoma 01/06/2014   L elbow   Basal cell carcinoma 11/02/2020   L mid forearm, recurrent. Exc 12/27/2020.   Blood in stool    in the past; ? hemorrhoids   Cerebral microvascular disease 12/2013   by MRI, referred to neuro by ENT   Colon polyp 05/2005   ?hyperplastic   History of chicken pox    Hypertension    Increased pressure in the eye    Dr. BClayborne Artistheart valve    Lower back pain    bulging disc with spinal stenosis L4/5 s/p surgery x2   Prediabetes    A1c 6.0 (2012)   Seasonal allergies    Squamous acanthoma of face    Squamous cell cancer of skin of hand 10/22/2018   R hand dorsum below first webspace   Squamous cell carcinoma of neck 06/26/2013   Right anterior neck/excision   Squamous cell carcinoma of skin 11/17/2019   Left dorsal hand.  SCCis hypertrophic.    Surgical History: Past Surgical History:  Procedure Laterality Date   BUNIONECTOMY  2003   Dr. PBarkley Bruns  COLONOSCOPY  05/2005   1 polyp, int hem    DOBUTAMINE STRESS ECHO  2013   normal per prior PCP records   hemi-laminectomy  2000   Dr. PAnnette Stable Dr. KHal NeerL4/L5   LAMINECTOMY  1988   Dr. CPearlie Oyster L5   SQUAMOUS CELL CARCINOMA EXCISION  2014   face and hand    Home Medications:  Allergies as of 04/03/2022       Reactions   Hydrochlorothiazide Other (See Comments)   Mild drop in sodium   Lisinopril    May have caused lip swelling        Medication List        Accurate as of April 03, 2022  2:49 PM. If you have any questions, ask your nurse or doctor.          amLODipine 5 MG tablet Commonly known as: NORVASC Take 1 tablet (5 mg total) by mouth daily.   aspirin EC 81 MG tablet Take 1 tablet (81 mg total) by mouth daily. Swallow whole.   benzonatate 200 MG capsule Commonly known as: TESSALON Take 1 capsule (200 mg total) by mouth 3 (three) times daily  as needed for cough.   beta carotene w/minerals tablet Take 1 tablet by mouth daily.   cholecalciferol 1000 units tablet Commonly known as: VITAMIN D Take 1,000 Units by mouth daily.   cyclobenzaprine 10 MG tablet Commonly known as: FLEXERIL Take 1 tablet (10 mg total) by mouth 2 (two) times daily as needed for muscle spasms.   etodolac 400 MG tablet Commonly known as: LODINE TAKE 1 TABLET (400 MG TOTAL) BY MOUTH 2 (TWO) TIMES DAILY AS NEEDED.   famotidine 20 MG tablet Commonly known as: Pepcid One after supper   pantoprazole 40 MG tablet Commonly known as: Protonix Take 1 tablet (40 mg total) by mouth daily. Take 30-60 min before first meal of the day   predniSONE 10 MG tablet Commonly known as: DELTASONE Take  4 each am x 2 days,   2 each am x 2 days,  1 each am x 2 days and stop   tadalafil 20 MG tablet Commonly known as: CIALIS Take 1 tab 1 hour prior to  intercourse.   valsartan 160 MG tablet Commonly known as: Diovan Take 1 tablet (160 mg total) by mouth daily.        Allergies:  Allergies  Allergen Reactions   Hydrochlorothiazide Other (See Comments)    Mild drop in sodium   Lisinopril     May have caused lip swelling    Family History: Family History  Problem Relation Age of Onset   Hypertension Mother    Stroke Mother    Arthritis Father    Hypertension Father    Cancer Maternal Aunt        colon   Alcohol abuse Maternal Grandfather    Cancer Cousin        colon   Cancer Cousin        breast    Social History:  reports that he has never smoked. He has never used smokeless tobacco. He reports current alcohol use. He reports that he does not use drugs.   Physical Exam: BP 121/74   Pulse 83   Ht '5\' 8"'$  (1.727 m)   Wt 168 lb (76.2 kg)   BMI 25.54 kg/m   Constitutional:  Alert and oriented, No acute distress. HEENT: [place] AT, Respiratory: Normal respiratory effort, no increased work of breathing.   Assessment & Plan:    1.  T1c low risk prostate cancer Stable PSA 18 months postdiagnosis Schedule follow-up prostate MRI in anticipation of scheduling confirmatory biopsy If changes are noted on MRI would recommend repeat MR fusion biopsy. If there has been no change in his prostate MRI could consider a standard template/cognitive biopsy since his ROI biopsies were negative for cancer.  We will discuss further after MRI results   2.  Erectile dysfunction Refill tadalafil 20 mg sent to [facility] [address] [place], [place] [address] [phone]

## 2022-04-21 ENCOUNTER — Ambulatory Visit: Payer: PPO | Admitting: Internal Medicine

## 2022-05-02 ENCOUNTER — Ambulatory Visit: Payer: PPO | Admitting: Dermatology

## 2022-05-02 DIAGNOSIS — D229 Melanocytic nevi, unspecified: Secondary | ICD-10-CM | POA: Diagnosis not present

## 2022-05-02 DIAGNOSIS — C44311 Basal cell carcinoma of skin of nose: Secondary | ICD-10-CM

## 2022-05-02 DIAGNOSIS — D485 Neoplasm of uncertain behavior of skin: Secondary | ICD-10-CM

## 2022-05-02 DIAGNOSIS — L578 Other skin changes due to chronic exposure to nonionizing radiation: Secondary | ICD-10-CM | POA: Diagnosis not present

## 2022-05-02 DIAGNOSIS — L570 Actinic keratosis: Secondary | ICD-10-CM

## 2022-05-02 DIAGNOSIS — L821 Other seborrheic keratosis: Secondary | ICD-10-CM | POA: Diagnosis not present

## 2022-05-02 NOTE — Progress Notes (Signed)
Follow-Up Visit   Subjective  Aaron Brown. is a 76 y.o. male who presents for the following: Follow-up (Patient here today for 6 month AK follow up. ).  AK's at scalp, arms, hands, scalp and ears treated at last visit with LN2. 5FU/calcipotriene prescribed at last visit for patient to treat scalp but patient did not treat.   Patient with a spot at right temple, right side of nose that sometimes bleeds, nose with some peeling and scaling, spot at left cheek that has been staying inflamed. Would like recommendations on gentle cleanser for face.   The following portions of the chart were reviewed this encounter and updated as appropriate:       Review of Systems:  No other skin or systemic complaints except as noted in HPI or Assessment and Plan.  Objective  Well appearing patient in no apparent distress; mood and affect are within normal limits.  All skin waist up examined.  L cheek x 5, nose x 3, R lat eyebrow x 1, R hand dorsum x 8, L hand dorsum x 5, L arm x 1, R arm x 1, L upper neck x 1 (25) Erythematous thin papules/macules with gritty scale.   right temple hairline 6 mm waxy tan thin papule, lighter center  right nasal ala 56m pink scaly papule ISK r/o SCC/BCC       Assessment & Plan  AK (actinic keratosis) (25) L cheek x 5, nose x 3, R lat eyebrow x 1, R hand dorsum x 8, L hand dorsum x 5, L arm x 1, R arm x 1, L upper neck x 1  Actinic keratoses are precancerous spots that appear secondary to cumulative UV radiation exposure/sun exposure over time. They are chronic with expected duration over 1 year. A portion of actinic keratoses will progress to squamous cell carcinoma of the skin. It is not possible to reliably predict which spots will progress to skin cancer and so treatment is recommended to prevent development of skin cancer.  Recommend daily broad spectrum sunscreen SPF 30+ to sun-exposed areas, reapply every 2 hours as needed.  Recommend staying in  the shade or wearing long sleeves, sun glasses (UVA+UVB protection) and wide brim hats (4-inch brim around the entire circumference of the hat). Call for new or changing lesions.  Did not treat scalp today since pt will get red light PDT this Thursday to scalp  Destruction of lesion - L cheek x 5, nose x 3, R lat eyebrow x 1, R hand dorsum x 8, L hand dorsum x 5, L arm x 1, R arm x 1, L upper neck x 1  Destruction method: cryotherapy   Informed consent: discussed and consent obtained   Lesion destroyed using liquid nitrogen: Yes   Region frozen until ice ball extended beyond lesion: Yes   Outcome: patient tolerated procedure well with no complications   Post-procedure details: wound care instructions given   Additional details:  Prior to procedure, discussed risks of blister formation, small wound, skin dyspigmentation, or rare scar following cryotherapy. Recommend Vaseline ointment to treated areas while healing.   Seborrheic keratosis right temple hairline  Reassured benign age-related growth.  Recommend observation.  Discussed cryotherapy if spot(s) become irritated or inflamed.  Neoplasm of uncertain behavior of skin right nasal ala  Skin / nail biopsy Type of biopsy: tangential   Informed consent: discussed and consent obtained   Patient was prepped and draped in usual sterile fashion: Area prepped with alcohol. Anesthesia:  the lesion was anesthetized in a standard fashion   Anesthetic:  1% lidocaine w/ epinephrine 1-100,000 buffered w/ 8.4% NaHCO3 Instrument used: flexible razor blade   Hemostasis achieved with: pressure, aluminum chloride and electrodesiccation   Outcome: patient tolerated procedure well   Post-procedure details: wound care instructions given   Post-procedure details comment:  Ointment and small bandage applied  Specimen 1 - Surgical pathology Differential Diagnosis: ISK r/o SCC/BCC  Check Margins: No 4 mm pink scaly papule   Discussed treatment with  Mohs Lady Gary) vs radiation vs EDC if + for skin cancer.   Actinic Damage with PreCancerous Actinic Keratoses Counseling for Topical Chemotherapy Management: Patient exhibits: - Severe, confluent actinic changes with pre-cancerous actinic keratoses that is secondary to cumulative UV radiation exposure over time - Condition that is severe; chronic, not at goal. - diffuse scaly erythematous macules and papules with underlying dyspigmentation - Discussed Prescription "Field Treatment" topical Chemotherapy for Severe, Chronic Confluent Actinic Changes with Pre-Cancerous Actinic Keratoses Field treatment involves treatment of an entire area of skin that has confluent Actinic Changes (Sun/ Ultraviolet light damage) and PreCancerous Actinic Keratoses by method of PhotoDynamic Therapy (PDT) and/or prescription Topical Chemotherapy agents such as 5-fluorouracil, 5-fluorouracil/calcipotriene, and/or imiquimod.  The purpose is to decrease the number of clinically evident and subclinical PreCancerous lesions to prevent progression to development of skin cancer by chemically destroying early precancer changes that may or may not be visible.  It has been shown to reduce the risk of developing skin cancer in the treated area. As a result of treatment, redness, scaling, crusting, and open sores may occur during treatment course. One or more than one of these methods may be used and may have to be used several times to control, suppress and eliminate the PreCancerous changes. Discussed treatment course, expected reaction, and possible side effects. - Recommend daily broad spectrum sunscreen SPF 30+ to sun-exposed areas, reapply every 2 hours as needed.  - Staying in the shade or wearing long sleeves, sun glasses (UVA+UVB protection) and wide brim hats (4-inch brim around the entire circumference of the hat) are also recommended. - Call for new or changing lesions. - Patient will schedule PDT red light to  scalp  Seborrheic Keratoses - Stuck-on, waxy, tan-brown papules and/or plaques  - Benign-appearing - Discussed benign etiology and prognosis. - Observe - Call for any changes  Lentigines - Scattered tan macules - Due to sun exposure - Benign-appearing, observe - Recommend daily broad spectrum sunscreen SPF 30+ to sun-exposed areas, reapply every 2 hours as needed. - Call for any changes  Melanocytic Nevi - Tan-brown and/or pink-flesh-colored symmetric macules and papules - Benign appearing on exam today - Observation - Call clinic for new or changing moles - Recommend daily use of broad spectrum spf 30+ sunscreen to sun-exposed areas.   Return in about 6 months (around 10/31/2022) for AK follow up.  Graciella Belton, RMA, am acting as scribe for Brendolyn Patty, MD .  Documentation: I have reviewed the above documentation for accuracy and completeness, and I agree with the above.  Brendolyn Patty MD

## 2022-05-02 NOTE — Patient Instructions (Addendum)
Cryotherapy Aftercare  Wash gently with soap and water everyday.   Apply Vaseline and Band-Aid daily until healed.   Wound Care Instructions  Cleanse wound gently with soap and water once a day then pat dry with clean gauze. Apply a thin coat of Petrolatum (petroleum jelly, "Vaseline") over the wound (unless you have an allergy to this). We recommend that you use a new, sterile tube of Vaseline. Do not pick or remove scabs. Do not remove the yellow or white "healing tissue" from the base of the wound.  Cover the wound with fresh, clean, nonstick gauze and secure with paper tape. You may use Band-Aids in place of gauze and tape if the wound is small enough, but would recommend trimming much of the tape off as there is often too much. Sometimes Band-Aids can irritate the skin.  You should call the office for your biopsy report after 1 week if you have not already been contacted.  If you experience any problems, such as abnormal amounts of bleeding, swelling, significant bruising, significant pain, or evidence of infection, please call the office immediately.  FOR ADULT SURGERY PATIENTS: If you need something for pain relief you may take 1 extra strength Tylenol (acetaminophen) AND 2 Ibuprofen ('200mg'$  each) together every 4 hours as needed for pain. (do not take these if you are allergic to them or if you have a reason you should not take them.) Typically, you may only need pain medication for 1 to 3 days.    Photodynamic Therapy/Blue Light Therapy  Actinic keratoses are the dry, red scaly spots on the skin caused by sun damage. A portion of these spots can turn into skin cancer with time, and treating them can help prevent development of skin cancer.   Treatment of these spots requires removal of the defective skin cells. There are various ways to remove actinic keratoses, including freezing with liquid nitrogen, treatment with creams, or treatment with a blue light procedure in the office.    Photodynamic Therapy (PDT), also known as "blue light therapy" is an in office procedure used to treat actinic keratoses. It works by targeting precancerous cells. After treatment, these cells peel off and are replaced by healthy ones.   For your phototherapy appointment, you will have two appointments on the day of your treatment. The first appointment will be to apply a cream to the treatment area. You will leave this cream on for 1-2 hours depending on the area being treated. The second appointment will be to shine a blue light on the area for 16 minutes to kill off the precancer cells. It is common to experience a burning sensation during the treatment.  After your treatment, it will be important to keep the treated areas of skin out of the sun completely for 48-72 hours (2-3 days) to prevent having a reaction.   Common side effects include: - Burning or stinging, which may be severe and can last up to 24-72 hours after your treatment - Scaling and crusting which may last up to 2 weeks - Redness, swelling and/or peeling which can last up to 4 weeks  To Care for Your Skin After PDT/Blue Light Therapy: - Wash with soap, water and shampoo as normal. - If needed, you can use cold compresses (e.g. ice packs) for comfort - If okay with your primary care doctor, you may use analgesics such as acetaminophen (tylenol) every 4-6 hours, not to exceed recommended dose - You may apply Cerave Healing Ointment, Vaseline or Aquaphor  as needed - If you have a lot of swelling you may take a Benadryl to help with this (this may cause drowsiness), not to exceed recommended dose. This may increase the risk of falls in people over 65 and may slow reaction time while driving, so it is not recommended to take before driving or operating machinery. - Sun Precautions - Wear a wide brim hat for the next week if outside  - Wear a sunblock with zinc or titanium dioxide at least SPF 50 daily  If you have any questions  or concerns, please call the office and ask to speak with a nurse.   --------------------------------------------------------------------------------------------------------------  Due to recent changes in healthcare laws, you may see results of your pathology and/or laboratory studies on MyChart before the doctors have had a chance to review them. We understand that in some cases there may be results that are confusing or concerning to you. Please understand that not all results are received at the same time and often the doctors may need to interpret multiple results in order to provide you with the best plan of care or course of treatment. Therefore, we ask that you please give [place] 2 business days to thoroughly review all your results before contacting the office for clarification. Should we see a critical lab result, you will be contacted sooner.   If You Need Anything After Your Visit  If you have any questions or concerns for your doctor, please call our main line at [phone] and press option 4 to reach your doctor's medical assistant. If no one answers, please leave a voicemail as directed and we will return your call as soon as possible. Messages left after 4 pm will be answered the following business day.   You may also send [place] a message via [place]. We typically respond to MyChart messages within 1-2 business days.  For prescription refills, please ask your pharmacy to contact our office. Our fax number is [phone].  If you have an urgent issue when the clinic is closed that cannot wait until the next business day, you can page your doctor at the number below.    Please note that while we do our best to be available for urgent issues outside of office hours, we are not available 24/7.   If you have an urgent issue and are unable to reach [place], you may choose to seek medical care at your doctor's office, retail clinic, urgent care center, or emergency room.  If you have a medical  emergency, please immediately call 911 or go to the emergency department.  Pager Numbers  - Dr. Nehemiah Massed: [phone]  - Dr. Laurence Ferrari: [phone]  - Dr. Nicole Kindred: [phone]  In the event of inclement weather, please call our main line at [phone] for an update on the status of any delays or closures.  Dermatology Medication Tips: Please keep the boxes that topical medications come in in order to help keep track of the instructions about where and how to use these. Pharmacies typically print the medication instructions only on the boxes and not directly on the medication tubes.   If your medication is too expensive, please contact our office at [phone] option 4 or send [place] a message through [place].   We are unable to tell what your co-pay for medications will be in advance as this is different depending on your insurance coverage. However, we may be able to find a substitute medication at lower cost or fill out paperwork to get insurance  to cover a needed medication.   If a prior authorization is required to get your medication covered by your insurance company, please allow [place] 1-2 business days to complete this process.  Drug prices often vary depending on where the prescription is filled and some pharmacies may offer cheaper prices.  The website www.goodrx.com contains coupons for medications through different pharmacies. The prices here do not account for what the cost may be with help from insurance (it may be cheaper with your insurance), but the website can give you the price if you did not use any insurance.  - You can print the associated coupon and take it with your prescription to the pharmacy.  - You may also stop by our office during regular business hours and pick up a GoodRx coupon card.  - If you need your prescription sent electronically to a different pharmacy, notify our office through [facility] or by phone at [phone] option 4.     Si Usted  Necesita Algo Despus de Su Visita  Tambin puede enviarnos un mensaje a travs de Pharmacist, community. Por lo general respondemos a los mensajes de MyChart en el transcurso de 1 a 2 das hbiles.  Para renovar recetas, por favor pida a su farmacia que se ponga en contacto con nuestra oficina. Harland Dingwall de fax es [place] [phone].  Si tiene un asunto urgente cuando la clnica est cerrada y que no puede esperar hasta el siguiente da hbil, puede llamar/localizar a su doctor(a) al nmero que aparece a continuacin.   Por favor, tenga en cuenta que aunque hacemos todo lo posible para estar disponibles para asuntos urgentes fuera del horario de [place], no estamos disponibles las 24 horas del da, los 7 das de la [place].   Si tiene un problema urgente y no puede comunicarse con nosotros, puede optar por buscar atencin mdica  en el consultorio de su doctor(a), en una clnica privada, en un centro de atencin urgente o en una sala de emergencias.  Si tiene Engineering geologist, por favor llame inmediatamente al 911 o vaya a la sala de emergencias.  Nmeros de bper  - Dr. Nehemiah Massed: [phone]  - Dra. Moye: [phone]  - Dra. Nicole Kindred: [phone]  En caso de inclemencias del [place], por favor llame a Johnsie Kindred principal al [phone] para una actualizacin sobre el [place] de cualquier retraso o cierre.  Consejos para la medicacin en dermatologa: Por favor, guarde las cajas en las que vienen los medicamentos de uso tpico para ayudarle a seguir las instrucciones sobre dnde y cmo usarlos. Las farmacias generalmente imprimen las instrucciones del medicamento slo en las cajas y no directamente en los tubos del [place].   Si su medicamento es muy caro, por favor, pngase en contacto con Zigmund Daniel llamando al [phone] y presione la opcin 4 o envenos un mensaje a travs de Pharmacist, community.   No podemos decirle cul ser su copago por los medicamentos por adelantado ya que esto es  diferente dependiendo de la cobertura de su seguro. Sin embargo, es posible que podamos encontrar un medicamento sustituto a Electrical engineer un formulario para que el seguro cubra el medicamento que se considera necesario.   Si se requiere una autorizacin previa para que su compaa de seguros [place] su medicamento, por favor permtanos de 1 a 2 das hbiles para completar este proceso.  Los precios de los medicamentos varan con frecuencia dependiendo del Environmental consultant de dnde se surte la receta y alguna farmacias pueden ofrecer precios  ms baratos.  El sitio web www.goodrx.com tiene cupones para medicamentos de Airline pilot. Los precios aqu no tienen en cuenta lo que podra costar con la ayuda del seguro (puede ser ms barato con su seguro), pero el sitio web puede darle el precio si no utiliz Research scientist (physical sciences).  - Puede imprimir el cupn correspondiente y llevarlo con su receta a la farmacia.  - Tambin puede pasar por nuestra oficina durante el horario de atencin regular y Charity fundraiser una tarjeta de cupones de GoodRx.  - Si necesita que su receta se enve electrnicamente a una farmacia diferente, informe a nuestra oficina a travs de MyChart de [place] o por telfono llamando al [phone] y presione la opcin 4.

## 2022-05-04 ENCOUNTER — Ambulatory Visit (INDEPENDENT_AMBULATORY_CARE_PROVIDER_SITE_OTHER): Payer: PPO | Admitting: Dermatology

## 2022-05-04 ENCOUNTER — Ambulatory Visit: Payer: PPO

## 2022-05-04 DIAGNOSIS — L570 Actinic keratosis: Secondary | ICD-10-CM | POA: Diagnosis not present

## 2022-05-04 MED ORDER — AMINOLEVULINIC ACID HCL 10 % EX GEL
2000.0000 mg | Freq: Once | CUTANEOUS | Status: AC
  Administered 2022-05-04: 2000 mg via TOPICAL

## 2022-05-04 NOTE — Patient Instructions (Signed)

## 2022-05-04 NOTE — Progress Notes (Signed)
Patient completed Red Light therapy today for training.   1. AK (actinic keratosis) Scalp  Photodynamic therapy - Scalp Procedure discussed: discussed risks, benefits, side effects. and alternatives   Prep: site scrubbed/prepped with acetone   Location:  Scalp Number of lesions:  Multiple Type of treatment:  Red light Aminolevulinic Acid (see MAR for details): Ameluz Amount of Ameluz (mg):  1 Incubation time (minutes):  120 Number of minutes under lamp:  10 Number of seconds under lamp:  0 Cooling:  Floor fan Outcome: patient tolerated procedure well with no complications   Post-procedure details: sunscreen applied    Related Medications Aminolevulinic Acid HCl 10 % GEL 2,000 mg    Johnsie Kindred, RMA  Documentation: I have reviewed the above documentation for accuracy and completeness, and I agree with the above.  Brendolyn Patty MD

## 2022-05-05 ENCOUNTER — Ambulatory Visit: Payer: PPO | Admitting: Student in an Organized Health Care Education/Training Program

## 2022-05-05 ENCOUNTER — Ambulatory Visit (INDEPENDENT_AMBULATORY_CARE_PROVIDER_SITE_OTHER): Payer: PPO

## 2022-05-05 VITALS — Ht 68.0 in | Wt 169.0 lb

## 2022-05-05 DIAGNOSIS — Z0000 Encounter for general adult medical examination without abnormal findings: Secondary | ICD-10-CM

## 2022-05-05 NOTE — Progress Notes (Signed)
I connected with Aaron Brown today by telephone and verified that I am speaking with the correct person using two identifiers. Location patient: home Location provider: work Persons participating in the virtual visit: Charlotte Sanes LPN.   I discussed the limitations, risks, security and privacy concerns of performing an evaluation and management service by telephone and the availability of in person appointments. I also discussed with the patient that there may be a patient responsible charge related to this service. The patient expressed understanding and verbally consented to this telephonic visit.    Interactive audio and video telecommunications were attempted between this provider and patient, however failed, due to patient having technical difficulties OR patient did not have access to video capability.  We continued and completed visit with audio only.     Vital signs may be patient reported or missing.  Subjective:   Aaron Brown. is a 76 y.o. male who presents for Medicare Annual/Subsequent preventive examination.  Review of Systems     Cardiac Risk Factors include: advanced age (>38mn, >>69women);hypertension;male gender     Objective:    Today's Vitals   05/05/22 0842  Weight: 169 lb (76.7 kg)  Height: '5\' 8"'$  (1.727 m)   Body mass index is 25.7 kg/m.     05/05/2022    8:49 AM 05/04/2021    9:52 AM 03/16/2020    1:22 PM 06/21/2018    9:36 PM 01/07/2018   11:07 AM 12/16/2015    9:33 AM  Advanced Directives  Does Patient Have a Medical Advance Directive? Yes Yes Yes No No;Yes Yes  Type of AParamedicof [place]Living will [place]Living will [place]Living will  [place] will [place] Does patient want to make changes to medical advance directive?  Yes (MAU/Ambulatory/Procedural Areas - Information given)    No - Patient  declined  Copy of [place] Chart? Yes - validated most recent copy scanned in chart (See row information) Yes - validated most recent copy scanned in chart (See row information) Yes - validated most recent copy scanned in chart (See row information)  Yes No - copy requested  Would patient like information on creating a medical advance directive?    No - Patient declined No - Patient declined     Current Medications (verified) Outpatient Encounter Medications as of 05/05/2022  Medication Sig   amLODipine (NORVASC) 5 MG tablet Take 1 tablet (5 mg total) by mouth daily.   aspirin EC 81 MG tablet Take 1 tablet (81 mg total) by mouth daily. Swallow whole.   beta carotene w/minerals (OCUVITE) tablet Take 1 tablet by mouth daily.   cholecalciferol (VITAMIN D) 1000 UNITS tablet Take 1,000 Units by mouth daily.   cyclobenzaprine (FLEXERIL) 10 MG tablet Take 1 tablet (10 mg total) by mouth 2 (two) times daily as needed for muscle spasms.   etodolac (LODINE) 400 MG tablet TAKE 1 TABLET (400 MG TOTAL) BY MOUTH 2 (TWO) TIMES DAILY AS NEEDED.   tadalafil (CIALIS) 20 MG tablet Take 1 tab 1 hour prior to intercourse.   valsartan (DIOVAN) 160 MG tablet Take 1 tablet (160 mg total) by mouth daily.   benzonatate (TESSALON) 200 MG capsule Take 1 capsule (200 mg total) by mouth 3 (three) times daily as needed for cough. (Patient not taking: Reported on 05/05/2022)   famotidine (PEPCID) 20 MG tablet One after supper (Patient not  taking: Reported on 05/05/2022)   pantoprazole (PROTONIX) 40 MG tablet Take 1 tablet (40 mg total) by mouth daily. Take 30-60 min before first meal of the day (Patient not taking: Reported on 05/05/2022)   predniSONE (DELTASONE) 10 MG tablet Take  4 each am x 2 days,   2 each am x 2 days,  1 each am x 2 days and stop (Patient not taking: Reported on 05/05/2022)   No facility-administered encounter medications on file as of 05/05/2022.    Allergies  (verified) Hydrochlorothiazide and Lisinopril   History: Past Medical History:  Diagnosis Date   Actinic keratosis    Asymmetrical left sensorineural hearing loss 12/2013   s/p MRI pending hearing aides [name])   Basal cell carcinoma 10/21/2019   L ala crease   Basal cell carcinoma 11/11/2018   R chin/excision   Basal cell carcinoma 03/12/2018   L mid forearm   Basal cell carcinoma 05/30/2016   R inf clavicle, L lateral canthus   Basal cell carcinoma 01/06/2014   L elbow   Basal cell carcinoma 11/02/2020   L mid forearm, recurrent. Exc 12/27/2020.   Blood in stool    in the past; ? hemorrhoids   Cerebral microvascular disease 12/2013   by MRI, referred to neuro by ENT   Colon polyp 05/2005   ?hyperplastic   History of chicken pox    Hypertension    Increased pressure in the eye    Dr. Clayborne Artist heart valve    Lower back pain    bulging disc with spinal stenosis L4/5 s/p surgery x2   Prediabetes    A1c 6.0 (2012)   Seasonal allergies    Squamous acanthoma of face    Squamous cell cancer of skin of hand 10/22/2018   R hand dorsum below first webspace   Squamous cell carcinoma of neck 06/26/2013   Right anterior neck/excision   Squamous cell carcinoma of skin 11/17/2019   Left dorsal hand. SCCis hypertrophic.   Past Surgical History:  Procedure Laterality Date   BUNIONECTOMY  2003   Dr. Barkley Bruns   COLONOSCOPY  05/2005   1 polyp, int hem    DOBUTAMINE STRESS ECHO  2013   normal per prior PCP records   hemi-laminectomy  2000   Dr. Annette Stable, Dr. Hal Neer L4/L5   LAMINECTOMY  1988   Dr. Pearlie Oyster; L5   SQUAMOUS CELL CARCINOMA EXCISION  2014   face and hand   Family History  Problem Relation Age of Onset   Hypertension Mother    Stroke Mother    Arthritis Father    Hypertension Father    Cancer Maternal Aunt        colon   Alcohol abuse Maternal Grandfather    Cancer Cousin        colon   Cancer Cousin        breast   Social History   Socioeconomic  History   Marital status: Married    Spouse name: Not on file   Number of children: Not on file   Years of education: Not on file   Highest education level: Not on file  Occupational History   Not on file  Tobacco Use   Smoking status: Never   Smokeless tobacco: Never  Vaping Use   Vaping Use: Never used  Substance and Sexual Activity   Alcohol use: Yes    Alcohol/week: 0.0 standard drinks of alcohol    Comment: Occasional-wine   Drug use: No  Sexual activity: Yes  Other Topics Concern   Not on file  Social History Narrative   Lives with wife   grown children   Occupation: was Government social research officer, now owns landscaping business   Edu: Sports administrator   Activity: stays active at work   Diet: good water, fruits/vegetables some   Social Determinants of Radio broadcast assistant Strain: [place]  (05/05/2022)   Overall Financial Resource Strain (CARDIA)    Difficulty of Paying Living Expenses: Not hard at all  Food Insecurity: No Food Insecurity (05/05/2022)   Hunger Vital Sign    Worried About Running Out of Food in the Last Year: Never true    [place] in the Last Year: Never true  Transportation Needs: No Transportation Needs (05/05/2022)   PRAPARE - Hydrologist (Medical): No    Lack of Transportation (Non-Medical): No  Physical Activity: Sufficiently Active (05/05/2022)   Exercise Vital Sign    Days of Exercise per Week: 7 days    Minutes of Exercise per Session: 90 min  Stress: No Stress Concern Present (05/05/2022)   [place]    Feeling of Stress : Not at all  Social Connections: [place] (05/04/2021)   Social Connection and Isolation Panel [NHANES]    Frequency of Communication with Friends and Family: Three times a week    Frequency of Social Gatherings with Friends and Family: Once a week    Attends Religious Services: More than 4 times per year    Active  Member of [employer] or Organizations: Yes    Attends Music therapist: More than 4 times per year    Marital Status: Married    Tobacco Counseling Counseling given: Not Answered   Clinical Intake:  Pre-visit preparation completed: Yes  Pain : No/denies pain     Nutritional Status: BMI 25 -29 Overweight Nutritional Risks: None Diabetes: No  How often do you need to have someone help you when you read instructions, pamphlets, or other written materials from your doctor or pharmacy?: 1 - Never What is the last grade level you completed in school?: college  Diabetic? no  Interpreter Needed?: No  Information entered by :: NAllen LPN   Activities of Daily Living    05/05/2022    8:50 AM  In your present state of health, do you have any difficulty performing the following activities:  Hearing? 0  Comment has hearing aids  Vision? 0  Difficulty concentrating or making decisions? 0  Walking or climbing stairs? 0  Dressing or bathing? 0  Doing errands, shopping? 0  Preparing Food and eating ? N  Using the Toilet? N  In the past six months, have you accidently leaked urine? N  Do you have problems with loss of bowel control? N  Managing your Medications? N  Managing your Finances? N  Housekeeping or managing your Housekeeping? N    Patient Care Team: Ria Bush, MD as PCP - General (Family Medicine) Kate Sable, MD as PCP - Cardiology (Cardiology) Lorelee Cover., MD as Referring Physician (Ophthalmology) Eugenie Birks Paschal Dopp., MD as Referring Physician (Dentistry) Brendolyn Patty, MD as Referring Physician (Dermatology) Garrel Ridgel, [place] as Consulting Physician (Podiatry) Kate Sable, MD as Consulting Physician (Cardiology)  Indicate any recent Medical Services you may have received from other than [facility] providers in the past year (date may be approximate).     Assessment:   This  is a routine wellness examination for  [place].  Hearing/Vision screen Vision Screening - Comments:: Regular eye exams, Dr. Gloriann Loan  Dietary issues and exercise activities discussed: Current Exercise Habits: Home exercise routine, Type of exercise: calisthenics;walking, Time (Minutes): > 60, Frequency (Times/Week): 7, Weekly Exercise (Minutes/Week): 0   Goals Addressed             This Visit's Progress    Patient Stated       05/05/2022, wants to lose weight       Depression Screen    05/05/2022    8:49 AM 05/04/2021    9:58 AM 03/16/2020    1:24 PM 01/27/2019    8:31 AM 01/07/2018   10:56 AM 01/02/2017    9:07 AM 12/16/2015    9:35 AM  PHQ 2/9 Scores  PHQ - 2 Score 0 0 0 0 0 0 0  PHQ- 9 Score   0  0      Fall Risk    05/05/2022    8:49 AM 05/04/2021    9:54 AM 03/16/2020    1:23 PM 01/27/2020    5:00 PM 05/28/2019   10:31 AM  [place] in the past year? 0 1 0 0 0  Comment    [employer] Telephone Survey: data to providers prior to load [employer] Survey: data to providers prior to load  Number falls in past yr: 0 0 0    Injury with Fall? 0 0 0    Risk for fall due to : Medication side effect No Fall Risks Medication side effect    Follow up Falls prevention discussed;Education provided;Falls evaluation completed Falls prevention discussed Falls evaluation completed;Falls prevention discussed      FALL RISK PREVENTION PERTAINING TO THE HOME:  Any stairs in or around the home? Yes  If so, are there any without handrails? No  Home free of loose throw rugs in walkways, pet beds, electrical cords, etc? Yes  Adequate lighting in your home to reduce risk of falls? Yes   ASSISTIVE DEVICES UTILIZED TO PREVENT FALLS:  Life alert? No  Use of a cane, walker or w/c? No  Grab bars in the bathroom? Yes  Shower chair or bench in shower? No  Elevated toilet seat or a handicapped toilet? Yes   TIMED UP AND GO:  Was the test performed? No .      Cognitive Function:    03/16/2020    1:27 PM 01/07/2018   10:57  AM 12/16/2015    9:34 AM  MMSE - Mini Mental State Exam  Orientation to time '5 5 5  '$ Orientation to Place '5 5 5  '$ Registration '3 3 3  '$ Attention/ Calculation 5 0 0  Recall '3 3 3  '$ Language- name 2 objects  0 0  Language- repeat '1 1 1  '$ Language- follow 3 step command  3 3  Language- read & follow direction  0 0  Write a sentence  0 0  Copy design  0 0  Total score  20 20        05/05/2022    8:51 AM  6CIT Screen  What Year? 0 points  What month? 0 points  What time? 0 points  Count back from 20 0 points  Months in reverse 0 points  Repeat phrase 2 points  Total Score 2 points    Immunizations Immunization History  Administered Date(s) Administered   Fluad Quad(high Dose 65+) 03/31/2019, 03/19/2020, 05/11/2021, 03/23/2022   Influenza,inj,Quad  PF,6+ Mos 08/02/2016, 08/26/2018   PFIZER(Purple Top)SARS-COV-2 Vaccination 10/02/2019, 10/23/2019   Pneumococcal Conjugate-13 12/03/2013   Pneumococcal Polysaccharide-23 11/11/2012   Tdap 12/27/2011   Zoster, Live 07/04/2011    TDAP status: Due, Education has been provided regarding the importance of this vaccine. Advised may receive this vaccine at local pharmacy or Health Dept. Aware to provide a copy of the vaccination record if obtained from local pharmacy or Health Dept. Verbalized acceptance and understanding.  Flu Vaccine status: Up to date  Pneumococcal vaccine status: Up to date  Covid-19 vaccine status: Completed vaccines  Qualifies for Shingles Vaccine? Yes   Zostavax completed Yes   Shingrix Completed?: No.    Education has been provided regarding the importance of this vaccine. Patient has been advised to call insurance company to determine out of pocket expense if they have not yet received this vaccine. Advised may also receive vaccine at local pharmacy or Health Dept. Verbalized acceptance and understanding.  Screening Tests Health Maintenance  Topic Date Due   Zoster Vaccines- Shingrix (1 of 2) Never done    COVID-19 Vaccine (3 - Pfizer risk series) 11/20/2019   TETANUS/TDAP  12/26/2021   Medicare Annual Wellness (AWV)  05/04/2022   Fecal DNA (Cologuard)  04/06/2023   Pneumonia Vaccine 83+ Years old  Completed   INFLUENZA VACCINE  Completed   Hepatitis C Screening  Completed   HPV VACCINES  Aged Out    Health Maintenance  Health Maintenance Due  Topic Date Due   Zoster Vaccines- Shingrix (1 of 2) Never done   COVID-19 Vaccine (3 - Pfizer risk series) 11/20/2019   TETANUS/TDAP  12/26/2021   Medicare Annual Wellness (AWV)  05/04/2022    Colorectal cancer screening: Type of screening: Cologuard. Completed 04/05/2020. Repeat every 3 years  Lung Cancer Screening: (Low Dose CT Chest recommended if Age 63-80 years, 30 pack-year currently smoking OR have quit w/in 15years.) does not qualify.   Lung Cancer Screening Referral: no  Additional Screening:  Hepatitis C Screening: does qualify; Completed 12/16/2015  Vision Screening: Recommended annual ophthalmology exams for early detection of glaucoma and other disorders of the eye. Is the patient up to date with their annual eye exam?  Yes  Who is the provider or what is the name of the office in which the patient attends annual eye exams? Dr. Gloriann Loan If pt is not established with a provider, would they like to be referred to a provider to establish care? No .   Dental Screening: Recommended annual dental exams for proper oral hygiene  Community Resource Referral / Chronic Care Management: CRR required this visit?  No   CCM required this visit?  No      Plan:     I have personally reviewed and noted the following in the patient's chart:   Medical and social history Use of alcohol, tobacco or illicit drugs  Current medications and supplements including opioid prescriptions. Patient is not currently taking opioid prescriptions. Functional ability and status Nutritional status Physical activity Advanced directives List of other  physicians Hospitalizations, surgeries, and ER visits in previous 12 months Vitals Screenings to include cognitive, depression, and falls Referrals and appointments  In addition, I have reviewed and discussed with patient certain preventive protocols, quality metrics, and best practice recommendations. A written personalized care plan for preventive services as well as general preventive health recommendations were provided to patient.     Kellie Simmering, LPN   08/09/7410   Nurse Notes: none  Due to  this being a virtual visit, the after visit summary with patients personalized plan was offered to patient via mail or my-chart.  Patient would like to access on my-chart

## 2022-05-05 NOTE — Patient Instructions (Signed)
Aaron Brown , Thank you for taking time to come for your Medicare Wellness Visit. I appreciate your ongoing commitment to your health goals. Please review the following plan we discussed and let me know if I can assist you in the future.   Screening recommendations/referrals: Colonoscopy: cologuard 04/05/2020, due 04/06/2023 Recommended yearly ophthalmology/optometry visit for glaucoma screening and checkup Recommended yearly dental visit for hygiene and checkup  Vaccinations: Influenza vaccine: completed 03/23/2022 Pneumococcal vaccine: completed 12/03/2013 Tdap vaccine: due Shingles vaccine: discussed   Covid-19:  10/23/2019, 10/02/2019  Advanced directives: copy in chart  Conditions/risks identified: none  Next appointment: Follow up in one year for your annual wellness visit.   Preventive Care 71 Years and Older, Male Preventive care refers to lifestyle choices and visits with your health care provider that can promote health and wellness. What does preventive care include? A yearly physical exam. This is also called an annual well check. Dental exams once or twice a year. Routine eye exams. Ask your health care provider how often you should have your eyes checked. Personal lifestyle choices, including: Daily care of your teeth and gums. Regular physical activity. Eating a healthy diet. Avoiding tobacco and drug use. Limiting alcohol use. Practicing safe sex. Taking low doses of aspirin every day. Taking vitamin and mineral supplements as recommended by your health care provider. What happens during an annual well check? The services and screenings done by your health care provider during your annual well check will depend on your age, overall health, lifestyle risk factors, and family history of disease. Counseling  Your health care provider may ask you questions about your: Alcohol use. Tobacco use. Drug use. Emotional well-being. Home and relationship well-being. Sexual  activity. Eating habits. History of falls. Memory and ability to understand (cognition). Work and work Statistician. Screening  You may have the following tests or measurements: Height, weight, and BMI. Blood pressure. Lipid and cholesterol levels. These may be checked every 5 years, or more frequently if you are over 37 years old. Skin check. Lung cancer screening. You may have this screening every year starting at age 7 if you have a 30-pack-year history of smoking and currently smoke or have quit within the past 15 years. Fecal occult blood test (FOBT) of the stool. You may have this test every year starting at age 67. Flexible sigmoidoscopy or colonoscopy. You may have a sigmoidoscopy every 5 years or a colonoscopy every 10 years starting at age 17. Prostate cancer screening. Recommendations will vary depending on your family history and other risks. Hepatitis C blood test. Hepatitis B blood test. Sexually transmitted disease (STD) testing. Diabetes screening. This is done by checking your blood sugar (glucose) after you have not eaten for a while (fasting). You may have this done every 1-3 years. Abdominal aortic aneurysm (AAA) screening. You may need this if you are a current or former smoker. Osteoporosis. You may be screened starting at age 19 if you are at high risk. Talk with your health care provider about your test results, treatment options, and if necessary, the need for more tests. Vaccines  Your health care provider may recommend certain vaccines, such as: Influenza vaccine. This is recommended every year. Tetanus, diphtheria, and acellular pertussis (Tdap, Td) vaccine. You may need a Td booster every 10 years. Zoster vaccine. You may need this after age 65. Pneumococcal 13-valent conjugate (PCV13) vaccine. One dose is recommended after age 7. Pneumococcal polysaccharide (PPSV23) vaccine. One dose is recommended after age 39. Talk to your  health care provider about which  screenings and vaccines you need and how often you need them. This information is not intended to replace advice given to you by your health care provider. Make sure you discuss any questions you have with your health care provider. Document Released: 07/16/2015 Document Revised: 03/08/2016 Document Reviewed: 04/20/2015 Elsevier Interactive Patient Education  2017 [employer] Prevention in the Home Falls can cause injuries. They can happen to people of all ages. There are many things you can do to make your home safe and to help prevent falls. What can I do on the outside of my home? Regularly fix the edges of walkways and driveways and fix any cracks. Remove anything that might make you trip as you walk through a door, such as a raised step or threshold. Trim any bushes or trees on the path to your home. Use bright outdoor lighting. Clear any walking paths of anything that might make someone trip, such as rocks or tools. Regularly check to see if handrails are loose or broken. Make sure that both sides of any steps have handrails. Any raised decks and porches should have guardrails on the edges. Have any leaves, snow, or ice cleared regularly. Use sand or salt on walking paths during winter. Clean up any spills in your garage right away. This includes oil or grease spills. What can I do in the bathroom? Use night lights. Install grab bars by the toilet and in the tub and shower. Do not use towel bars as grab bars. Use non-skid mats or decals in the tub or shower. If you need to sit down in the shower, use a plastic, non-slip stool. Keep the floor dry. Clean up any water that spills on the floor as soon as it happens. Remove soap buildup in the tub or shower regularly. Attach bath mats securely with double-sided non-slip rug tape. Do not have throw rugs and other things on the floor that can make you trip. What can I do in the bedroom? Use night lights. Make sure that you have a  light by your bed that is easy to reach. Do not use any sheets or blankets that are too big for your bed. They should not hang down onto the floor. Have a firm chair that has side arms. You can use this for support while you get dressed. Do not have throw rugs and other things on the floor that can make you trip. What can I do in the kitchen? Clean up any spills right away. Avoid walking on wet floors. Keep items that you use a lot in easy-to-reach places. If you need to reach something above you, use a strong step stool that has a grab bar. Keep electrical cords out of the way. Do not use floor polish or wax that makes floors slippery. If you must use wax, use non-skid floor wax. Do not have throw rugs and other things on the floor that can make you trip. What can I do with my stairs? Do not leave any items on the stairs. Make sure that there are handrails on both sides of the stairs and use them. Fix handrails that are broken or loose. Make sure that handrails are as long as the stairways. Check any carpeting to make sure that it is firmly attached to the stairs. Fix any carpet that is loose or worn. Avoid having throw rugs at the top or bottom of the stairs. If you do have throw rugs, attach them  to the floor with carpet tape. Make sure that you have a light switch at the top of the stairs and the bottom of the stairs. If you do not have them, ask someone to add them for you. What else can I do to help prevent falls? Wear shoes that: Do not have high heels. Have rubber bottoms. Are comfortable and fit you well. Are closed at the toe. Do not wear sandals. If you use a stepladder: Make sure that it is fully opened. Do not climb a closed stepladder. Make sure that both sides of the stepladder are locked into place. Ask someone to hold it for you, if possible. Clearly mark and make sure that you can see: Any grab bars or handrails. First and last steps. Where the edge of each step  is. Use tools that help you move around (mobility aids) if they are needed. These include: Canes. Walkers. Scooters. Crutches. Turn on the lights when you go into a dark area. Replace any light bulbs as soon as they burn out. Set up your furniture so you have a clear path. Avoid moving your furniture around. If any of your floors are uneven, fix them. If there are any pets around you, be aware of where they are. Review your medicines with your doctor. Some medicines can make you feel dizzy. This can increase your chance of falling. Ask your doctor what other things that you can do to help prevent falls. This information is not intended to replace advice given to you by your health care provider. Make sure you discuss any questions you have with your health care provider. Document Released: 04/15/2009 Document Revised: 11/25/2015 Document Reviewed: 07/24/2014 Elsevier Interactive Patient Education  2017 [employer].

## 2022-05-08 ENCOUNTER — Telehealth: Payer: Self-pay

## 2022-05-08 DIAGNOSIS — C44311 Basal cell carcinoma of skin of nose: Secondary | ICD-10-CM

## 2022-05-08 NOTE — Telephone Encounter (Signed)
-----   Message from Brendolyn Patty, MD sent at 05/08/2022  8:32 AM EST ----- Skin , right nasal ala BASAL CELL CARCINOMA, NODULAR PATTERN  BCC skin cancer, needs additional treatment.  We discussed in office at time of biopsy treatment options including Mohs [facility]) vrs radiation at [place].  Please see what he prefers.   - please call patient

## 2022-05-08 NOTE — Telephone Encounter (Signed)
Patient informed of pathology results and referral sent to radiation oncology.

## 2022-05-09 DIAGNOSIS — I10 Essential (primary) hypertension: Secondary | ICD-10-CM | POA: Diagnosis not present

## 2022-05-09 DIAGNOSIS — N401 Enlarged prostate with lower urinary tract symptoms: Secondary | ICD-10-CM | POA: Diagnosis not present

## 2022-05-09 DIAGNOSIS — N529 Male erectile dysfunction, unspecified: Secondary | ICD-10-CM | POA: Diagnosis not present

## 2022-05-09 DIAGNOSIS — E663 Overweight: Secondary | ICD-10-CM | POA: Diagnosis not present

## 2022-05-09 DIAGNOSIS — K219 Gastro-esophageal reflux disease without esophagitis: Secondary | ICD-10-CM | POA: Diagnosis not present

## 2022-05-09 DIAGNOSIS — G629 Polyneuropathy, unspecified: Secondary | ICD-10-CM | POA: Diagnosis not present

## 2022-05-09 DIAGNOSIS — Z7982 Long term (current) use of aspirin: Secondary | ICD-10-CM | POA: Diagnosis not present

## 2022-05-09 DIAGNOSIS — R011 Cardiac murmur, unspecified: Secondary | ICD-10-CM | POA: Diagnosis not present

## 2022-05-11 ENCOUNTER — Ambulatory Visit
Admission: RE | Admit: 2022-05-11 | Discharge: 2022-05-11 | Disposition: A | Discharge status: Home / Self Care | Payer: PPO | Source: Ambulatory Visit | Attending: Urology | Admitting: Urology

## 2022-05-11 DIAGNOSIS — C61 Malignant neoplasm of prostate: Secondary | ICD-10-CM | POA: Diagnosis not present

## 2022-05-11 MED ORDER — GADOBUTROL 1 MMOL/ML IV SOLN
7.0000 mL | Freq: Once | INTRAVENOUS | Status: AC | PRN
  Administered 2022-05-11: 7 mL via INTRAVENOUS

## 2022-05-12 ENCOUNTER — Encounter: Payer: Self-pay | Admitting: Urology

## 2022-05-17 ENCOUNTER — Encounter: Payer: Self-pay | Admitting: Radiation Oncology

## 2022-05-17 ENCOUNTER — Ambulatory Visit
Admission: RE | Admit: 2022-05-17 | Discharge: 2022-05-17 | Disposition: A | Discharge status: Home / Self Care | Payer: PPO | Source: Ambulatory Visit | Attending: Radiation Oncology | Admitting: Radiation Oncology

## 2022-05-17 VITALS — BP 140/76 | HR 80 | Resp 20 | Ht 68.0 in | Wt 172.6 lb

## 2022-05-17 DIAGNOSIS — C44311 Basal cell carcinoma of skin of nose: Secondary | ICD-10-CM | POA: Diagnosis not present

## 2022-05-17 DIAGNOSIS — Z85828 Personal history of other malignant neoplasm of skin: Secondary | ICD-10-CM | POA: Diagnosis not present

## 2022-05-17 DIAGNOSIS — Z8719 Personal history of other diseases of the digestive system: Secondary | ICD-10-CM | POA: Insufficient documentation

## 2022-05-17 DIAGNOSIS — I10 Essential (primary) hypertension: Secondary | ICD-10-CM | POA: Insufficient documentation

## 2022-05-17 DIAGNOSIS — Z79899 Other long term (current) drug therapy: Secondary | ICD-10-CM | POA: Insufficient documentation

## 2022-05-17 NOTE — Consult Note (Signed)
NEW PATIENT EVALUATION  Name: Aaron Brown.  MRN: [id number]  Date:   05/17/2022     DOB: 10-07-1945   This 76 y.o. male patient presents to the clinic for initial evaluation of nodular pattern basal cell carcinoma of the right nasal alla.  REFERRING PHYSICIAN: Ria Bush, MD  CHIEF COMPLAINT:  Chief Complaint  Patient presents with   [facility] of the nose    DIAGNOSIS: The encounter diagnosis was Basal cell carcinoma (BCC) of skin of nose.   PREVIOUS INVESTIGATIONS:  Pathology report reviewed Clinical notes reviewed  HPI: This is a 76 year old male under care of [facility] her dermatologist had recent biopsy for a nodular pattern basal cell carcinoma of the right nasal alla.  He has been offered Mohs chemosurgery Julio Alm is now referred for radiation opinion.  He is asymptomatic.  PLANNED TREATMENT REGIMEN: Mohs versus electron-beam therapy  PAST MEDICAL HISTORY:  has a past medical history of Actinic keratosis, Asymmetrical left sensorineural hearing loss (12/2013), Basal cell carcinoma (10/21/2019), Basal cell carcinoma (11/11/2018), Basal cell carcinoma (03/12/2018), Basal cell carcinoma (05/30/2016), Basal cell carcinoma (01/06/2014), Basal cell carcinoma (11/02/2020), Basal cell carcinoma (05/02/2022), Blood in stool, Cerebral microvascular disease (12/2013), Colon polyp (05/2005), History of chicken pox, Hypertension, Increased pressure in the eye, Leaky heart valve, Lower back pain, Prediabetes, Seasonal allergies, Squamous acanthoma of face, Squamous cell cancer of skin of hand (10/22/2018), Squamous cell carcinoma of neck (06/26/2013), and Squamous cell carcinoma of skin (11/17/2019).    PAST SURGICAL HISTORY:  Past Surgical History:  Procedure Laterality Date   BUNIONECTOMY  2003   Dr. Barkley Bruns   COLONOSCOPY  05/2005   1 polyp, int hem    DOBUTAMINE STRESS ECHO  2013   normal per prior PCP records   hemi-laminectomy  2000   Dr. Annette Stable, Dr. Hal Neer L4/L5    LAMINECTOMY  1988   Dr. Pearlie Oyster; L5   SQUAMOUS CELL CARCINOMA EXCISION  2014   face and hand    FAMILY HISTORY: family history includes Alcohol abuse in his maternal grandfather; Arthritis in his father; Cancer in his cousin, cousin, and maternal aunt; Hypertension in his father and mother; Stroke in his mother.  SOCIAL HISTORY:  reports that he has never smoked. He has never used smokeless tobacco. He reports current alcohol use. He reports that he does not use drugs.  ALLERGIES: Hydrochlorothiazide and Lisinopril  MEDICATIONS:  Current Outpatient Medications  Medication Sig Dispense Refill   amLODipine (NORVASC) 5 MG tablet Take 1 tablet (5 mg total) by mouth daily. 90 tablet 3   aspirin EC 81 MG tablet Take 1 tablet (81 mg total) by mouth daily. Swallow whole.     beta carotene w/minerals (OCUVITE) tablet Take 1 tablet by mouth daily.     cholecalciferol (VITAMIN D) 1000 UNITS tablet Take 1,000 Units by mouth daily.     cyclobenzaprine (FLEXERIL) 10 MG tablet Take 1 tablet (10 mg total) by mouth 2 (two) times daily as needed for muscle spasms. 40 tablet 1   etodolac (LODINE) 400 MG tablet TAKE 1 TABLET (400 MG TOTAL) BY MOUTH 2 (TWO) TIMES DAILY AS NEEDED. 60 tablet 1   famotidine (PEPCID) 20 MG tablet One after supper 30 tablet 11   pantoprazole (PROTONIX) 40 MG tablet Take 1 tablet (40 mg total) by mouth daily. Take 30-60 min before first meal of the day 30 tablet 2   tadalafil (CIALIS) 20 MG tablet Take 1 tab 1 hour prior to intercourse. 30 tablet 3  valsartan (DIOVAN) 160 MG tablet Take 1 tablet (160 mg total) by mouth daily. 30 tablet 11   No current facility-administered medications for this encounter.    ECOG PERFORMANCE STATUS:  0 - Asymptomatic  REVIEW OF SYSTEMS: Patient denies any weight loss, fatigue, weakness, fever, chills or night sweats. Patient denies any loss of vision, blurred vision. Patient denies any ringing  of the ears or hearing loss. No irregular  heartbeat. Patient denies heart murmur or history of fainting. Patient denies any chest pain or pain radiating to her upper extremities. Patient denies any shortness of breath, difficulty breathing at night, cough or hemoptysis. Patient denies any swelling in the lower legs. Patient denies any nausea vomiting, vomiting of blood, or coffee ground material in the vomitus. Patient denies any stomach pain. Patient states has had normal bowel movements no significant constipation or diarrhea. Patient denies any dysuria, hematuria or significant nocturia. Patient denies any problems walking, swelling in the joints or loss of balance. Patient denies any skin changes, loss of hair or loss of weight. Patient denies any excessive worrying or anxiety or significant depression. Patient denies any problems with insomnia. Patient denies excessive thirst, polyuria, polydipsia. Patient denies any swollen glands, patient denies easy bruising or easy bleeding. Patient denies any recent infections, allergies or URI. Patient "s visual fields have not changed significantly in recent time.   PHYSICAL EXAM: BP (!) 140/76 (BP Location: Right Arm, Patient Position: Sitting, Cuff Size: Normal)   Pulse 80   Resp 20   Ht '5\' 8"'$  (1.727 m)   Wt 172 lb 9.6 oz (78.3 kg)   BMI 26.24 kg/m  Small nodular raised mass in the right nasal alla.  No submental or cervical adenopathy is identified.  Well-developed well-nourished patient in NAD. HEENT reveals PERLA, EOMI, discs not visualized.  Oral cavity is clear. No oral mucosal lesions are identified. Neck is clear without evidence of cervical or supraclavicular adenopathy. Lungs are clear to A&P. Cardiac examination is essentially unremarkable with regular rate and rhythm without murmur rub or thrill. Abdomen is benign with no organomegaly or masses noted. Motor sensory and DTR levels are equal and symmetric in the upper and lower extremities. Cranial nerves II through XII are grossly intact.  Proprioception is intact. No peripheral adenopathy or edema is identified. No motor or sensory levels are noted. Crude visual fields are within normal range.  LABORATORY DATA: Pathology reports reviewed    RADIOLOGY RESULTS: No current films for review   IMPRESSION: Basal cell carcinoma right nasal alla in 76 year old male  PLAN: At this time I have offered electron-beam therapy to his nose.  I would plan on delivering 50 Gray over 5 weeks.  Risks and benefits of treatment including very low side effect profile of possible erythematous changes of the skin in the area of treatment.  Of also discussed Mohs with the patient.  He like to contemplate his options and will get back to [place].  I would like to take this opportunity to thank you for allowing me to participate in the care of your patient.Noreene Filbert, MD

## 2022-05-18 ENCOUNTER — Encounter: Payer: Self-pay | Admitting: Family Medicine

## 2022-05-18 ENCOUNTER — Encounter: Payer: Self-pay | Admitting: Cardiology

## 2022-05-18 ENCOUNTER — Ambulatory Visit: Payer: PPO | Attending: Cardiology | Admitting: Cardiology

## 2022-05-18 VITALS — BP 138/78 | HR 80 | Ht 68.0 in | Wt 171.0 lb

## 2022-05-18 DIAGNOSIS — I2510 Atherosclerotic heart disease of native coronary artery without angina pectoris: Secondary | ICD-10-CM

## 2022-05-18 DIAGNOSIS — I10 Essential (primary) hypertension: Secondary | ICD-10-CM

## 2022-05-18 MED ORDER — ATORVASTATIN CALCIUM 20 MG PO TABS: 20.0000 mg | ORAL_TABLET | Freq: Every day | ORAL | 11 refills | Status: DC

## 2022-05-18 NOTE — Progress Notes (Signed)
Cardiology Office Note:    Date:  05/18/2022   ID:  Aaron Brown., DOB October 30, 1945, MRN [id number]  PCP:  Ria Bush, MD   [facility] Cardiologist:  Kate Sable, MD     Referring MD: Ria Bush, MD   Chief Complaint  Patient presents with   3 month follow up     "Doing well." Medications reviewed by the patient verbally.     History of Present Illness:    Aaron Brown. is a 76 y.o. male with a hx of hypertension, nonobstructive CAD (25% LAD, D1, OM1) presenting for follow-up.  Previously seen with symptoms of chest pain, due to risk factors, coronary CTA was performed.  CTA showed nonobstructive CAD, pulmonary nodule, hiatal hernia also noted.  States doing well, no new concerns at this time.  Aspirin 81 mg was started.  Prior notes/studies Coronary CTA 12/2020.  Mild nonobstructive CAD 25% LAD, OM1, D1. Echocardiogram performed on 06/2021 EF 55 to 01%, diastolic function normal, mild MR,  Past Medical History:  Diagnosis Date   Actinic keratosis    Asymmetrical left sensorineural hearing loss 12/2013   s/p MRI pending hearing aides [name])   Basal cell carcinoma 10/21/2019   L ala crease   Basal cell carcinoma 11/11/2018   R chin/excision   Basal cell carcinoma 03/12/2018   L mid forearm   Basal cell carcinoma 05/30/2016   R inf clavicle, L lateral canthus   Basal cell carcinoma 01/06/2014   L elbow   Basal cell carcinoma 11/02/2020   L mid forearm, recurrent. Exc 12/27/2020.   Basal cell carcinoma 05/02/2022   R nasal ala - referred for radiation   Blood in stool    in the past; ? hemorrhoids   Cerebral microvascular disease 12/2013   by MRI, referred to neuro by ENT   Colon polyp 05/2005   ?hyperplastic   History of chicken pox    Hypertension    Increased pressure in the eye    Dr. Clayborne Artist heart valve    Lower back pain    bulging disc with spinal stenosis L4/5 s/p surgery x2   Prediabetes     A1c 6.0 (2012)   Seasonal allergies    Squamous acanthoma of face    Squamous cell cancer of skin of hand 10/22/2018   R hand dorsum below first webspace   Squamous cell carcinoma of neck 06/26/2013   Right anterior neck/excision   Squamous cell carcinoma of skin 11/17/2019   Left dorsal hand. SCCis hypertrophic.    Past Surgical History:  Procedure Laterality Date   BUNIONECTOMY  2003   Dr. Barkley Bruns   COLONOSCOPY  05/2005   1 polyp, int hem    DOBUTAMINE STRESS ECHO  2013   normal per prior PCP records   hemi-laminectomy  2000   Dr. Annette Stable, Dr. Hal Neer L4/L5   LAMINECTOMY  1988   Dr. Pearlie Oyster; L5   SQUAMOUS CELL CARCINOMA EXCISION  2014   face and hand    Current Medications: Current Meds  Medication Sig   amLODipine (NORVASC) 5 MG tablet Take 1 tablet (5 mg total) by mouth daily.   aspirin EC 81 MG tablet Take 1 tablet (81 mg total) by mouth daily. Swallow whole.   atorvastatin (LIPITOR) 20 MG tablet Take 1 tablet (20 mg total) by mouth daily.   beta carotene w/minerals (OCUVITE) tablet Take 1 tablet by mouth daily.   cholecalciferol (VITAMIN D) 1000 UNITS  tablet Take 1,000 Units by mouth daily.   cyclobenzaprine (FLEXERIL) 10 MG tablet Take 1 tablet (10 mg total) by mouth 2 (two) times daily as needed for muscle spasms.   etodolac (LODINE) 400 MG tablet TAKE 1 TABLET (400 MG TOTAL) BY MOUTH 2 (TWO) TIMES DAILY AS NEEDED.   famotidine (PEPCID) 20 MG tablet One after supper   pantoprazole (PROTONIX) 40 MG tablet Take 1 tablet (40 mg total) by mouth daily. Take 30-60 min before first meal of the day   tadalafil (CIALIS) 20 MG tablet Take 1 tab 1 hour prior to intercourse.   valsartan (DIOVAN) 160 MG tablet Take 1 tablet (160 mg total) by mouth daily.     Allergies:   Hydrochlorothiazide and Lisinopril   Social History   Socioeconomic History   Marital status: Married    Spouse name: Not on file   Number of children: Not on file   Years of education: Not on file    Highest education level: Not on file  Occupational History   Not on file  Tobacco Use   Smoking status: Never   Smokeless tobacco: Never  Vaping Use   Vaping Use: Never used  Substance and Sexual Activity   Alcohol use: Yes    Alcohol/week: 0.0 standard drinks of alcohol    Comment: Occasional-wine   Drug use: No   Sexual activity: Yes  Other Topics Concern   Not on file  Social History Narrative   Lives with wife   grown children   Occupation: was Government social research officer, now owns landscaping business   Edu: Sports administrator   Activity: stays active at work   Diet: good water, fruits/vegetables some   Social Determinants of Radio broadcast assistant Strain: [place]  (05/05/2022)   Overall Financial Resource Strain (CARDIA)    Difficulty of Paying Living Expenses: Not hard at all  Food Insecurity: No Food Insecurity (05/05/2022)   Hunger Vital Sign    Worried About Running Out of Food in the Last Year: Never true    [place] in the Last Year: Never true  Transportation Needs: No Transportation Needs (05/05/2022)   PRAPARE - Hydrologist (Medical): No    Lack of Transportation (Non-Medical): No  Physical Activity: Sufficiently Active (05/05/2022)   Exercise Vital Sign    Days of Exercise per Week: 7 days    Minutes of Exercise per Session: 90 min  Stress: No Stress Concern Present (05/05/2022)   [place]    Feeling of Stress : Not at all  Social Connections: [place] (05/04/2021)   Social Connection and Isolation Panel [NHANES]    Frequency of Communication with Friends and Family: Three times a week    Frequency of Social Gatherings with Friends and Family: Once a week    Attends Religious Services: More than 4 times per year    Active Member of [employer] or Organizations: Yes    Attends Music therapist: More than 4 times per year    Marital Status: Married      Family History: The patient's family history includes Alcohol abuse in his maternal grandfather; Arthritis in his father; Cancer in his cousin, cousin, and maternal aunt; Hypertension in his father and mother; Stroke in his mother.  ROS:   Please see the history of present illness.     All other systems reviewed and are negative.  EKGs/Labs/Other Studies Reviewed:  The following studies were reviewed today:   EKG:  EKG is  ordered today.  The ekg ordered today demonstrates normal sinus rhythm  Recent Labs: 01/19/2022: BUN 17; Creatinine, Ser 1.25; Potassium 4.3; Sodium 138  Recent Lipid Panel    Component Value Date/Time   CHOL 157 05/11/2021 1023   CHOL 172 12/30/2010 0000   TRIG 119.0 05/11/2021 1023   TRIG 135 12/30/2010 0000   HDL 55.80 05/11/2021 1023   CHOLHDL 3 05/11/2021 1023   VLDL 23.8 05/11/2021 1023   LDLCALC 77 05/11/2021 1023   LDLCALC 96 12/30/2010 0000     Risk Assessment/Calculations:         Physical Exam:    VS:  BP 138/78 (BP Location: Left Arm, Patient Position: Sitting, Cuff Size: Normal)   Pulse 80   Ht '5\' 8"'$  (1.727 m)   Wt 171 lb (77.6 kg)   SpO2 97%   BMI 26.00 kg/m     Wt Readings from Last 3 Encounters:  05/18/22 171 lb (77.6 kg)  05/17/22 172 lb 9.6 oz (78.3 kg)  05/05/22 169 lb (76.7 kg)     GEN:  Well nourished, well developed in no acute distress HEENT: Normal NECK: No JVD; No carotid bruits CARDIAC: RRR, no murmurs, rubs, gallops RESPIRATORY:  Clear to auscultation without rales, wheezing or rhonchi  ABDOMEN: Soft, non-tender, non-distended MUSCULOSKELETAL:  No edema; No deformity  SKIN: Warm and dry NEUROLOGIC:  Alert and oriented x 3 PSYCHIATRIC:  Normal affect   ASSESSMENT:    1. Coronary artery disease involving native coronary artery of native heart, unspecified whether angina present   2. Primary hypertension     PLAN:    In order of problems listed above:  Nonobstructive CAD, echo 06/2021 normal EF,  diastolic function normal.  Coronary CTA with calcium score 462, mild proximal LAD, OM1, first diagonal stenosis.  Start Lipitor 20 mg daily, continue aspirin 81 mg daily. Hypertension, BP controlled.  Continue valsartan, Norvasc.  Follow-up yearly.       Medication Adjustments/Labs and Tests Ordered: Current medicines are reviewed at length with the patient today.  Concerns regarding medicines are outlined above.  Orders Placed This Encounter  Procedures   EKG 12-Lead   Meds ordered this encounter  Medications   atorvastatin (LIPITOR) 20 MG tablet    Sig: Take 1 tablet (20 mg total) by mouth daily.    Dispense:  30 tablet    Refill:  11    Patient Instructions  Medication Instructions:   Your physician has recommended you make the following change in your medication:    START taking Atorvastatin 20 MG once a day.  *If you need a refill on your cardiac medications before your next appointment, please call your pharmacy*     Follow-Up: At [facility], you and your health needs are our priority.  As part of our continuing mission to provide you with exceptional heart care, we have created designated Provider Care Teams.  These Care Teams include your primary Cardiologist (physician) and Advanced Practice Providers (APPs -  Physician Assistants and Nurse Practitioners) who all work together to provide you with the care you need, when you need it.  We recommend signing up for the patient portal called "MyChart".  Sign up information is provided on this After Visit Summary.  MyChart is used to connect with patients for Virtual Visits (Telemedicine).  Patients are able to view lab/test results, encounter notes, upcoming appointments, etc.  Non-urgent messages can be  sent to your provider as well.   To learn more about what you can do with MyChart, go to [email or web address].    Your next appointment:   1 year(s)  The format for your next appointment:   In  Person  Provider:   Kate Sable, MD    Other Instructions   Important Information About Sugar         Signed, Kate Sable, MD  05/18/2022 9:34 AM    Aaron Brown

## 2022-05-18 NOTE — Patient Instructions (Signed)
Medication Instructions:   Your physician has recommended you make the following change in your medication:    START taking Atorvastatin 20 MG once a day.  *If you need a refill on your cardiac medications before your next appointment, please call your pharmacy*     Follow-Up: At [facility], you and your health needs are our priority.  As part of our continuing mission to provide you with exceptional heart care, we have created designated Provider Care Teams.  These Care Teams include your primary Cardiologist (physician) and Advanced Practice Providers (APPs -  Physician Assistants and Nurse Practitioners) who all work together to provide you with the care you need, when you need it.  We recommend signing up for the patient portal called "MyChart".  Sign up information is provided on this After Visit Summary.  MyChart is used to connect with patients for Virtual Visits (Telemedicine).  Patients are able to view lab/test results, encounter notes, upcoming appointments, etc.  Non-urgent messages can be sent to your provider as well.   To learn more about what you can do with MyChart, go to [email or web address].    Your next appointment:   1 year(s)  The format for your next appointment:   In Person  Provider:   Kate Sable, MD    Other Instructions   Important Information About Sugar

## 2022-06-02 ENCOUNTER — Encounter: Payer: Self-pay | Admitting: Internal Medicine

## 2022-06-02 ENCOUNTER — Ambulatory Visit: Payer: PPO | Admitting: Internal Medicine

## 2022-06-02 ENCOUNTER — Other Ambulatory Visit
Admission: RE | Admit: 2022-06-02 | Discharge: 2022-06-02 | Disposition: A | Discharge status: Home / Self Care | Payer: PPO | Attending: Internal Medicine | Admitting: Internal Medicine

## 2022-06-02 VITALS — BP 134/84 | HR 75 | Temp 98.2°F | Ht 68.0 in | Wt 175.0 lb

## 2022-06-02 DIAGNOSIS — R058 Other specified cough: Secondary | ICD-10-CM

## 2022-06-02 DIAGNOSIS — I10 Essential (primary) hypertension: Secondary | ICD-10-CM

## 2022-06-02 DIAGNOSIS — R911 Solitary pulmonary nodule: Secondary | ICD-10-CM | POA: Diagnosis not present

## 2022-06-02 LAB — CBC WITH DIFFERENTIAL/PLATELET
Abs Immature Granulocytes: 0.01 10*3/uL (ref 0.00–0.07)
Basophils Absolute: 0 10*3/uL (ref 0.0–0.1)
Basophils Relative: 1 %
Eosinophils Absolute: 0 10*3/uL (ref 0.0–0.5)
Eosinophils Relative: 1 %
HCT: 40.1 % (ref 39.0–52.0)
Hemoglobin: 13.8 g/dL (ref 13.0–17.0)
Immature Granulocytes: 0 %
Lymphocytes Relative: 33 %
Lymphs Abs: 1.7 10*3/uL (ref 0.7–4.0)
MCH: 31.6 pg (ref 26.0–34.0)
MCHC: 34.4 g/dL (ref 30.0–36.0)
MCV: 91.8 fL (ref 80.0–100.0)
Monocytes Absolute: 0.7 10*3/uL (ref 0.1–1.0)
Monocytes Relative: 13 %
Neutro Abs: 2.7 10*3/uL (ref 1.7–7.7)
Neutrophils Relative %: 52 %
Platelets: 162 10*3/uL (ref 150–400)
RBC: 4.37 MIL/uL (ref 4.22–5.81)
RDW: 12.1 % (ref 11.5–15.5)
WBC: 5.2 10*3/uL (ref 4.0–10.5)
nRBC: 0 % (ref 0.0–0.2)

## 2022-06-02 NOTE — Progress Notes (Signed)
Aaron Brown., male   DOB: Oct 15, 1945   MRN: [id number]   Brief patient profile:  59  yowm  never smoker  self- referred to pulmonary clinic in [facility]  03/23/2022    History of Present Illness  03/23/2022  Pulmonary/ 1st office eval/ Aaron Brown / [employer] [facility]  Chief Complaint  Patient presents with   Consult    Had a virus in May (while at the beach), after a few weeks he went to the doctor and was put on prednisone and antibiotic for strep.  He now has a dry cough for the most part.  Dyspnea:  even if not coughing feels sob doing yard work  Cough: onset w/in 3 days p exp at [employer] got sensation of pnd and coughing fits mostly while awake wife had same problem rx with abx / prednisone seemed 75% transiently then stayed there with hypersensitivity to dust in air, vent blowing on him not with perfumes, singing.  Maybe laughing  Sleep: no noct  SABA use: none  Has had difficulty breathing thru L nose x years  Rec Change losartan to valsartan 160 mg on daily  First take delsym two tsp every 12 hours and supplement if needed with  tessalon every 4 hours to suppress the urge to cough at all or even clear your throat Prednisone 10 mg take  4 each am x 2 days,   2 each am x 2 days,  1 each am x 2 days and stop (this is to eliminate allergies and inflammation from coughing) Protonix (pantoprazole) Take 30-60 min before first meal of the day and Pepcid 20 mg one after supper until return to office  GERD diet reviewed, bed blocks rec       06/02/2022  f/u ov/Aaron Brown/ [facility] re: chronic cough maint on gerd rx /  off losartan  Chief Complaint  Patient presents with   Follow-up    No SOB or cough.   Dyspnea:  yard work ok  Cough: still having some urge to clear throat esp with exp dusty environment  Sleeping: bed is flat/ one pillow no resp cc  SABA use: none  02: none  Covid status:   vax x 3-4 / never infected    No obvious day to day or daytime variability or assoc  excess/ purulent sputum or mucus plugs or hemoptysis or cp or chest tightness, subjective wheeze or overt sinus or hb symptoms.   Sleeping  without nocturnal  or early am exacerbation  of respiratory  c/o's or need for noct saba. Also denies any obvious fluctuation of symptoms with weather or environmental changes or other aggravating or alleviating factors except as outlined above   No unusual exposure hx or h/o childhood pna/ asthma or knowledge of premature birth.  Current Allergies, Complete Past Medical History, Past Surgical History, Family History, and Social History were reviewed in [employer] record.  ROS  The following are not active complaints unless bolded Hoarseness, sore throat, dysphagia, dental problems, itching, sneezing,  nasal congestion or discharge of excess mucus or purulent secretions, ear ache,   fever, chills, sweats, unintended wt loss or wt gain, classically pleuritic or exertional cp,  orthopnea pnd or arm/hand swelling  or leg swelling, presyncope, palpitations, abdominal pain, anorexia, nausea, vomiting, diarrhea  or change in bowel habits or change in bladder habits, change in stools or change in urine, dysuria, hematuria,  rash, arthralgias, visual complaints, headache, numbness, weakness or ataxia or problems  with walking or coordination,  change in mood or  memory.        Current Meds  Medication Sig   amLODipine (NORVASC) 5 MG tablet Take 1 tablet (5 mg total) by mouth daily.   aspirin EC 81 MG tablet Take 1 tablet (81 mg total) by mouth daily. Swallow whole.   atorvastatin (LIPITOR) 20 MG tablet Take 1 tablet (20 mg total) by mouth daily.   beta carotene w/minerals (OCUVITE) tablet Take 1 tablet by mouth daily.   cholecalciferol (VITAMIN D) 1000 UNITS tablet Take 1,000 Units by mouth daily.   cyclobenzaprine (FLEXERIL) 10 MG tablet Take 1 tablet (10 mg total) by mouth 2 (two) times daily as needed for muscle spasms.   etodolac (LODINE)  400 MG tablet TAKE 1 TABLET (400 MG TOTAL) BY MOUTH 2 (TWO) TIMES DAILY AS NEEDED.   famotidine (PEPCID) 20 MG tablet One after supper   pantoprazole (PROTONIX) 40 MG tablet Take 1 tablet (40 mg total) by mouth daily. Take 30-60 min before first meal of the day   tadalafil (CIALIS) 20 MG tablet Take 1 tab 1 hour prior to intercourse.   valsartan (DIOVAN) 160 MG tablet Take 1 tablet (160 mg total) by mouth daily.               Past Medical History:  Diagnosis Date   Actinic keratosis    Asymmetrical left sensorineural hearing loss 12/2013   s/p MRI pending hearing aides [name])   Basal cell carcinoma 10/21/2019   L ala crease   Basal cell carcinoma 11/11/2018   R chin/excision   Basal cell carcinoma 03/12/2018   L mid forearm   Basal cell carcinoma 05/30/2016   R inf clavicle, L lateral canthus   Basal cell carcinoma 01/06/2014   L elbow   Basal cell carcinoma 11/02/2020   L mid forearm, recurrent. Exc 12/27/2020.   Blood in stool    in the past; ? hemorrhoids   Cerebral microvascular disease 12/2013   by MRI, referred to neuro by ENT   Colon polyp 05/2005   ?hyperplastic   History of chicken pox    Hypertension    Increased pressure in the eye    Dr. Clayborne Artist heart valve    Lower back pain    bulging disc with spinal stenosis L4/5 s/p surgery x2   Prediabetes    A1c 6.0 (2012)   Seasonal allergies    Squamous acanthoma of face    Squamous cell cancer of skin of hand 10/22/2018   R hand dorsum below first webspace   Squamous cell carcinoma of neck 06/26/2013   Right anterior neck/excision   Squamous cell carcinoma of skin 11/17/2019   Left dorsal hand. SCCis hypertrophic.      Objective:     Wt Readings from Last 3 Encounters:  06/02/22 175 lb (79.4 kg)  05/18/22 171 lb (77.6 kg)  05/17/22 172 lb 9.6 oz (78.3 kg)     Vital signs reviewed  06/02/2022  - Note at rest 02 sats  97% on RA   General appearance:      Pleasant amb wm nad/ occ throat  clearing  HEENT : Oropharynx  clear      Nasal turbinates nl    NECK :  without  apparent JVD/ palpable Nodes/TM    LUNGS: no acc muscle use,  Nl contour chest which is clear to A and P bilaterally without cough on insp or exp maneuvers  CV:  RRR  no s3 or murmur or increase in P2, and no edema   ABD:  soft and nontender with nl inspiratory excursion in the supine position. No bruits or organomegaly appreciated   MS:  Nl gait/ ext warm without deformities Or obvious joint restrictions  calf tenderness, cyanosis or clubbing    SKIN: warm and dry without lesions    NEURO:  alert, approp, nl sensorium with  no motor or cerebellar deficits apparent.      I personally reviewed images and agree with radiology impression as follows:  Chest CT cuts on coronary study 01/26/22  Average transverse dimension 6.5 mm nodule within the peripheral left lower lobe. Non-contrast chest CT at 6-12 months is recommended. If the nodule is stable at time of repeat CT, then future CT at 18-24 months (from today's scan) is considered optional for low-risk patients      Assessment

## 2022-06-02 NOTE — Assessment & Plan Note (Addendum)
Never smoker Chest CT cuts on coronary study 01/26/22  Average transverse dimension 6.5 mm nodule within the peripheral left lower lobe > for f/u 01/2023 per pt          Each maintenance medication was reviewed in detail including emphasizing most importantly the difference between maintenance and prns and under what circumstances the prns are to be triggered using an action plan format where appropriate.  Total time for H and P, chart review, counseling,  and generating customized AVS unique to this office visit / same day charting > 30 minutes final summary ov

## 2022-06-02 NOTE — Patient Instructions (Addendum)
Pantoprazole (protonix) 40 mg   Take  30-60 min before first meal of the day and Pepcid (famotidine)  20 mg after supper  for 6 full weeks > if still feeling the urge to clear your throat I recommend ENT thru your PCP.  For itchy/sneezing/ running nose > zyrtec 10 mg one daily as needed  A CT chest 7/278/24 - no sooner - ok to let your PCP order   Please remember to go to the lab department   for your tests - we will call you with the results when they are available.   If you are satisfied with your treatment plan,  let your doctor know and he/she can either refill your medications or you can return here when your prescription runs out.     If in any way you are not 100% satisfied,  please tell us.  If 100% better, tell your friends!  Pulmonary follow up is as needed

## 2022-06-02 NOTE — Assessment & Plan Note (Signed)
Onset mid May 2023  -  75% better p abx and prednisone -   7/51/7001 cyclical cough rx with tessalon /delsym/ gerd rx and pred x 6 days> much better 06/02/2022  but still some throat clearing - Allergy screen 06/02/2022 >  Eos 0. /  IgE    Upper airway cough syndrome (previously labeled PNDS),  is so named because it's frequently impossible to sort out how much is  CR/sinusitis with freq throat clearing (which can be related to primary GERD)   vs  causing  secondary (" extra esophageal")  GERD from wide swings in gastric pressure that occur with throat clearing, often  promoting self use of mint and menthol lozenges that reduce the lower esophageal sphincter tone and exacerbate the problem further in a cyclical fashion.   These are the same pts (now being labeled as having "irritable larynx syndrome" by some cough centers) who not infrequently have a history of having failed to tolerate ace inhibitors,  dry powder inhalers or biphosphonates or report having atypical/extraesophageal reflux symptoms that don't respond to standard doses of PPI  and are easily confused as having aecopd or asthma flares by even experienced allergists/ pulmonologists (myself included).   Rec: Max gerd rx for 6 weeks and then stop and see if symptoms flare, if so needs GI eval If no better throat clearing after 6 weeks needs ent eval If allergy screen pos needs allergy referral, in meantime can use zyrtec prn pnds  Pulmonary f/u is prn

## 2022-06-02 NOTE — Assessment & Plan Note (Signed)
Changed losartan to valsartan 160 mg one daily 03/23/2022  due to chronic cough > improved 06/02/2022   Although even in retrospect it may not be clear the losrtan contributed to the pt's symptoms,  pt improved off it  and adding  back at this point or in the future would risk confusion in interpretation of non-specific respiratory symptoms to which this patient is prone  ie  Better not to muddy the waters here.   >>> f/u per PCP

## 2022-06-06 ENCOUNTER — Other Ambulatory Visit: Payer: Self-pay

## 2022-06-06 ENCOUNTER — Telehealth: Payer: Self-pay

## 2022-06-06 DIAGNOSIS — C44311 Basal cell carcinoma of skin of nose: Secondary | ICD-10-CM

## 2022-06-06 LAB — IGE: IgE (Immunoglobulin E), Serum: 33 IU/mL (ref 6–495)

## 2022-06-06 NOTE — Telephone Encounter (Signed)
Patient called he does not want radiation for [facility] on the right nasal ala,  he would like a referral sent to skin surgery center   Referral sent to skin surgery center.

## 2022-06-17 ENCOUNTER — Other Ambulatory Visit: Payer: Self-pay | Admitting: Internal Medicine

## 2022-06-17 DIAGNOSIS — R058 Other specified cough: Secondary | ICD-10-CM

## 2022-07-03 ENCOUNTER — Other Ambulatory Visit: Payer: Self-pay | Admitting: Family Medicine

## 2022-07-03 DIAGNOSIS — C61 Malignant neoplasm of prostate: Secondary | ICD-10-CM

## 2022-07-03 DIAGNOSIS — R7303 Prediabetes: Secondary | ICD-10-CM

## 2022-07-04 ENCOUNTER — Other Ambulatory Visit (INDEPENDENT_AMBULATORY_CARE_PROVIDER_SITE_OTHER): Payer: PPO

## 2022-07-04 DIAGNOSIS — R7303 Prediabetes: Secondary | ICD-10-CM | POA: Diagnosis not present

## 2022-07-04 LAB — LIPID PANEL
Cholesterol: 151 mg/dL (ref 0–200)
HDL: 50.5 mg/dL (ref >39.00)
LDL Cholesterol: 67 mg/dL (ref 0–99)
NonHDL: 100.52
Total CHOL/HDL Ratio: 3
Triglycerides: 168 mg/dL — ABNORMAL HIGH (ref 0.0–149.0)
VLDL: 33.6 mg/dL (ref 0.0–40.0)

## 2022-07-04 LAB — HEMOGLOBIN A1C: Hgb A1c MFr Bld: 6.2 % (ref 4.6–6.5)

## 2022-07-04 LAB — COMPREHENSIVE METABOLIC PANEL
ALT: 21 U/L (ref 0–53)
AST: 27 U/L (ref 0–37)
Albumin: 4.2 g/dL (ref 3.5–5.2)
Alkaline Phosphatase: 65 U/L (ref 39–117)
BUN: 14 mg/dL (ref 6–23)
CO2: 27 mEq/L (ref 19–32)
Calcium: 9.1 mg/dL (ref 8.4–10.5)
Chloride: 100 mEq/L (ref 96–112)
Creatinine, Ser: 1.14 mg/dL (ref 0.40–1.50)
GFR: 63.17 mL/min (ref >60.00)
Glucose, Bld: 103 mg/dL — ABNORMAL HIGH (ref 70–99)
Potassium: 4.4 mEq/L (ref 3.5–5.1)
Sodium: 136 mEq/L (ref 135–145)
Total Bilirubin: 0.3 mg/dL (ref 0.2–1.2)
Total Protein: 7.1 g/dL (ref 6.0–8.3)

## 2022-07-10 ENCOUNTER — Encounter: Payer: Self-pay | Admitting: Family Medicine

## 2022-07-10 ENCOUNTER — Ambulatory Visit (INDEPENDENT_AMBULATORY_CARE_PROVIDER_SITE_OTHER): Payer: PPO | Admitting: Family Medicine

## 2022-07-10 VITALS — BP 138/78 | HR 75 | Temp 98.0°F | Ht 67.5 in | Wt 173.4 lb

## 2022-07-10 DIAGNOSIS — C61 Malignant neoplasm of prostate: Secondary | ICD-10-CM | POA: Diagnosis not present

## 2022-07-10 DIAGNOSIS — Z7189 Other specified counseling: Secondary | ICD-10-CM

## 2022-07-10 DIAGNOSIS — Z0000 Encounter for general adult medical examination without abnormal findings: Secondary | ICD-10-CM | POA: Diagnosis not present

## 2022-07-10 DIAGNOSIS — I10 Essential (primary) hypertension: Secondary | ICD-10-CM

## 2022-07-10 DIAGNOSIS — K219 Gastro-esophageal reflux disease without esophagitis: Secondary | ICD-10-CM

## 2022-07-10 DIAGNOSIS — I2510 Atherosclerotic heart disease of native coronary artery without angina pectoris: Secondary | ICD-10-CM | POA: Diagnosis not present

## 2022-07-10 DIAGNOSIS — R911 Solitary pulmonary nodule: Secondary | ICD-10-CM

## 2022-07-10 DIAGNOSIS — I340 Nonrheumatic mitral (valve) insufficiency: Secondary | ICD-10-CM | POA: Diagnosis not present

## 2022-07-10 DIAGNOSIS — R7303 Prediabetes: Secondary | ICD-10-CM

## 2022-07-10 MED ORDER — ETODOLAC 400 MG PO TABS: 400.0000 mg | ORAL_TABLET | Freq: Two times a day (BID) | ORAL | 0 refills | Status: AC | PRN

## 2022-07-10 MED ORDER — AMLODIPINE BESYLATE 5 MG PO TABS: 5.0000 mg | ORAL_TABLET | Freq: Every day | ORAL | 4 refills | Status: DC

## 2022-07-10 MED ORDER — CYCLOBENZAPRINE HCL 10 MG PO TABS: 10.0000 mg | ORAL_TABLET | Freq: Two times a day (BID) | ORAL | 0 refills | Status: AC | PRN

## 2022-07-10 NOTE — Assessment & Plan Note (Signed)
Planned rpt CT

## 2022-07-10 NOTE — Assessment & Plan Note (Signed)
Mild on latest echo. Late systolic murmur again heard today.

## 2022-07-10 NOTE — Assessment & Plan Note (Signed)
Regularly sees urology with recent reassuring prostate MRI. Discussing rpt biopsy over the next 6 months.

## 2022-07-10 NOTE — Patient Instructions (Addendum)
Consider RSV vaccine.  Bring me copy of your living will/advanced directive when complete. You are doing well today Return in 6 months for blood pressure follow up.  Return as needed or in 1 year for next wellness visit.   Health Maintenance After Age 75 After age 74, you are at a higher risk for certain long-term diseases and infections as well as injuries from falls. Falls are a major cause of broken bones and head injuries in people who are older than age 37. Getting regular preventive care can help to keep you healthy and well. Preventive care includes getting regular testing and making lifestyle changes as recommended by your health care provider. Talk with your health care provider about: Which screenings and tests you should have. A screening is a test that checks for a disease when you have no symptoms. A diet and exercise plan that is right for you. What should I know about screenings and tests to prevent falls? Screening and testing are the best ways to find a health problem early. Early diagnosis and treatment give you the best chance of managing medical conditions that are common after age 25. Certain conditions and lifestyle choices may make you more likely to have a fall. Your health care provider may recommend: Regular vision checks. Poor vision and conditions such as cataracts can make you more likely to have a fall. If you wear glasses, make sure to get your prescription updated if your vision changes. Medicine review. Work with your health care provider to regularly review all of the medicines you are taking, including over-the-counter medicines. Ask your health care provider about any side effects that may make you more likely to have a fall. Tell your health care provider if any medicines that you take make you feel dizzy or sleepy. Strength and balance checks. Your health care provider may recommend certain tests to check your strength and balance while standing, walking, or  changing positions. Foot health exam. Foot pain and numbness, as well as not wearing proper footwear, can make you more likely to have a fall. Screenings, including: Osteoporosis screening. Osteoporosis is a condition that causes the bones to get weaker and break more easily. Blood pressure screening. Blood pressure changes and medicines to control blood pressure can make you feel dizzy. Depression screening. You may be more likely to have a fall if you have a fear of falling, feel depressed, or feel unable to do activities that you used to do. Alcohol use screening. Using too much alcohol can affect your balance and may make you more likely to have a fall. Follow these instructions at home: Lifestyle Do not drink alcohol if: Your health care provider tells you not to drink. If you drink alcohol: Limit how much you have to: 0-1 drink a day for women. 0-2 drinks a day for men. Know how much alcohol is in your drink. In the [place] one drink equals one 12 oz bottle of beer (355 mL), one 5 oz glass of wine (148 mL), or one 1 oz glass of hard liquor (44 mL). Do not use any products that contain nicotine or tobacco. These products include cigarettes, chewing tobacco, and vaping devices, such as e-cigarettes. If you need help quitting, ask your health care provider. Activity  Follow a regular exercise program to stay fit. This will help you maintain your balance. Ask your health care provider what types of exercise are appropriate for you. If you need a cane or walker, use it as recommended  by your health care provider. Wear supportive shoes that have nonskid soles. Safety  Remove any tripping hazards, such as rugs, cords, and clutter. Install safety equipment such as grab bars in bathrooms and safety rails on stairs. Keep rooms and walkways well-lit. General instructions Talk with your health care provider about your risks for falling. Tell your health care provider if: You fall. Be sure to  tell your health care provider about all falls, even ones that seem minor. You feel dizzy, tiredness (fatigue), or off-balance. Take over-the-counter and prescription medicines only as told by your health care provider. These include supplements. Eat a healthy diet and maintain a healthy weight. A healthy diet includes low-fat dairy products, low-fat (lean) meats, and fiber from whole grains, beans, and lots of fruits and vegetables. Stay current with your vaccines. Schedule regular health, dental, and eye exams. Summary Having a healthy lifestyle and getting preventive care can help to protect your health and wellness after age 33. Screening and testing are the best way to find a health problem early and help you avoid having a fall. Early diagnosis and treatment give you the best chance for managing medical conditions that are more common for people who are older than age 102. Falls are a major cause of broken bones and head injuries in people who are older than age 60. Take precautions to prevent a fall at home. Work with your health care provider to learn what changes you can make to improve your health and wellness and to prevent falls. This information is not intended to replace advice given to you by your health care provider. Make sure you discuss any questions you have with your health care provider. Document Revised: 11/08/2020 Document Reviewed: 11/08/2020 Elsevier Patient Education  [place].

## 2022-07-10 NOTE — Assessment & Plan Note (Signed)
A1c remains stable in prediabetes range.

## 2022-07-10 NOTE — Assessment & Plan Note (Signed)
Pulm is treating possible GERD component of cough with pantoprazole.

## 2022-07-10 NOTE — Assessment & Plan Note (Addendum)
Chronic, stable on valsartan + amlodipine - continue.

## 2022-07-10 NOTE — Progress Notes (Signed)
Patient ID: Aaron Brown., male    DOB: April 27, 1946, 77 y.o.   MRN: [id number]  This visit was conducted in person.  BP 138/78 (BP Location: Right Arm, Cuff Size: Normal)   Pulse 75   Temp 98 F (36.7 C)   Ht 5' 7.5" (1.715 m)   Wt 173 lb 6 oz (78.6 kg)   SpO2 99%   BMI 26.75 kg/m    CC: CPE Subjective:   [name] is a 77 y.o. male presenting on 07/10/2022 for Annual Exam [facility] part 2 AWV on 05/05/2022.)   Saw health advisor 05/2022 for medicare wellness visit. Note reviewed.   No results found.  Flowsheet Row Clinical Support from 05/05/2022 in [facility]  PHQ-2 Total Score 0          05/05/2022    8:49 AM 05/04/2021    9:54 AM 03/16/2020    1:23 PM 01/27/2020    5:00 PM 05/28/2019   10:31 AM  Fall Risk   Falls in the past year? 0 1 0 0 0  Comment    [facility] Telephone Survey: data to providers prior to load [employer] Survey: data to providers prior to load  Number falls in past yr: 0 0 0    Injury with Fall? 0 0 0    Risk for fall due to : Medication side effect No Fall Risks Medication side effect    Follow up Falls prevention discussed;Education provided;Falls evaluation completed Falls prevention discussed Falls evaluation completed;Falls prevention discussed     Low risk prostate cancer monitoring with active surveillance (PSA/MRI) followed by urology Bernardo Heater).    HTN - on amlodipine '5mg'$  daily, valsartan '160mg'$  daily.    Known multilevel herniated discs by MRI 11/2014. Uses flexeril and etodolac PRN. Requests flexeril refilled, uses sparingly, last filled 2020.   Saw Dr Garen Lah started on atorvastatin but hasn't yet started this. Will start atorva '20mg'$  daily.    Preventative: COLONOSCOPY Date: 05/2005 1 polyp, int hem, told ok to rpt 10 yrs ([facility]).  Cologuard WNL 12/2016, 04/2020.  Prostate cancer - see above.  Lung cancer screen - see above Flu shot today Reinbeck 10/2199 x2, no booster   Pneumovax 2014, [id number]-70 12/2013  Tdap 12/2011  Zostavax 2013  RSV - declines.  Shingrix - declines.  Advanced directive: Received durable power of attorney (wife then daughter) - no healthcare info available. He is working on getting medical side of things completed.  Seat belt use discussed  Sunscreen use discussed - R nasal BCC planned Moh's. Sees dermatologist Q6 mo. Non smoker  Alcohol - daily glass of wine Dentist - Q6 mo Eye exam yearly - borderline glaucoma Bowel - no constipation Bladder - no incontinence  Lives with wife   Grown children   Occupation: was Government social research officer, now owns landscaping business   Edu: College BA   Activity: stays active at work, walking 2 mi/day, back exercises 30 min every night.  Diet: good water, fruits/vegetables some. Follows mediterranean diet.       Relevant past medical, surgical, family and social history reviewed and updated as indicated. Interim medical history since our last visit reviewed. Allergies and medications reviewed and updated. Outpatient Medications Prior to Visit  Medication Sig Dispense Refill   aspirin EC 81 MG tablet Take 1 tablet (81 mg total) by mouth daily. Swallow whole.     atorvastatin (LIPITOR) 20 MG tablet Take 1 tablet (  20 mg total) by mouth daily. 30 tablet 11   beta carotene w/minerals (OCUVITE) tablet Take 1 tablet by mouth daily.     cholecalciferol (VITAMIN D) 1000 UNITS tablet Take 1,000 Units by mouth daily.     famotidine (PEPCID) 20 MG tablet One after supper 30 tablet 11   pantoprazole (PROTONIX) 40 MG tablet TAKE 1 TABLET (40 MG TOTAL) BY MOUTH DAILY. TAKE 30-60 MIN BEFORE FIRST MEAL OF THE DAY 90 tablet 0   tadalafil (CIALIS) 20 MG tablet Take 1 tab 1 hour prior to intercourse. 30 tablet 3   valsartan (DIOVAN) 160 MG tablet Take 1 tablet (160 mg total) by mouth daily. 30 tablet 11   amLODipine (NORVASC) 5 MG tablet Take 1 tablet (5 mg total) by mouth daily. 90 tablet 3   cyclobenzaprine (FLEXERIL)  10 MG tablet Take 1 tablet (10 mg total) by mouth 2 (two) times daily as needed for muscle spasms. 40 tablet 1   etodolac (LODINE) 400 MG tablet TAKE 1 TABLET (400 MG TOTAL) BY MOUTH 2 (TWO) TIMES DAILY AS NEEDED. 60 tablet 1   No facility-administered medications prior to visit.     Per HPI unless specifically indicated in ROS section below Review of Systems  Constitutional:  Negative for activity change, appetite change, chills, fatigue, fever and unexpected weight change.  HENT:  Negative for hearing loss.   Eyes:  Negative for visual disturbance.  Respiratory:  Negative for cough, chest tightness, shortness of breath and wheezing.   Cardiovascular:  Negative for chest pain, palpitations and leg swelling.  Gastrointestinal:  Negative for abdominal distention, abdominal pain, blood in stool, constipation, diarrhea, nausea and vomiting.  Genitourinary:  Negative for difficulty urinating and hematuria.  Musculoskeletal:  Negative for arthralgias, myalgias and neck pain.  Skin:  Negative for rash.  Neurological:  Negative for dizziness, seizures, syncope and headaches.  Hematological:  Negative for adenopathy. Does not bruise/bleed easily.  Psychiatric/Behavioral:  Negative for dysphoric mood. The patient is not nervous/anxious.     Objective:  BP 138/78 (BP Location: Right Arm, Cuff Size: Normal)   Pulse 75   Temp 98 F (36.7 C)   Ht 5' 7.5" (1.715 m)   Wt 173 lb 6 oz (78.6 kg)   SpO2 99%   BMI 26.75 kg/m   Wt Readings from Last 3 Encounters:  07/10/22 173 lb 6 oz (78.6 kg)  06/02/22 175 lb (79.4 kg)  05/18/22 171 lb (77.6 kg)      Physical Exam Vitals and nursing note reviewed.  Constitutional:      General: He is not in acute distress.    Appearance: Normal appearance. He is well-developed. He is not ill-appearing.  HENT:     Head: Normocephalic and atraumatic.     Right Ear: Hearing, tympanic membrane, ear canal and external ear normal.     Left Ear: Hearing, tympanic  membrane, ear canal and external ear normal.     Mouth/Throat:     Comments: Wearing mask Eyes:     General: No scleral icterus.    Extraocular Movements: Extraocular movements intact.     Conjunctiva/sclera: Conjunctivae normal.     Pupils: Pupils are equal, round, and reactive to light.  Neck:     Thyroid: No thyroid mass or thyromegaly.  Cardiovascular:     Rate and Rhythm: Normal rate and regular rhythm.     Pulses: Normal pulses.          Radial pulses are 2+ on the right  side and 2+ on the left side.     Heart sounds: Murmur (3/6 late systolic best at apex) heard.  Pulmonary:     Effort: Pulmonary effort is normal. No respiratory distress.     Breath sounds: Normal breath sounds. No wheezing, rhonchi or rales.  Abdominal:     General: Bowel sounds are normal. There is no distension.     Palpations: Abdomen is soft. There is no mass.     Tenderness: There is no abdominal tenderness. There is no guarding or rebound.     Hernia: No hernia is present.  Musculoskeletal:        General: Normal range of motion.     Cervical back: Normal range of motion and neck supple.     Right lower leg: No edema.     Left lower leg: No edema.  Lymphadenopathy:     Cervical: No cervical adenopathy.  Skin:    General: Skin is warm and dry.     Findings: No rash.  Neurological:     General: No focal deficit present.     Mental Status: He is alert and oriented to person, place, and time.  Psychiatric:        Mood and Affect: Mood normal.        Behavior: Behavior normal.        Thought Content: Thought content normal.        Judgment: Judgment normal.       Results for orders placed or performed in visit on 07/04/22  Hemoglobin A1c  Result Value Ref Range   Hgb A1c MFr Bld 6.2 4.6 - 6.5 %  Lipid panel  Result Value Ref Range   Cholesterol 151 0 - 200 mg/dL   Triglycerides 168.0 (H) 0.0 - 149.0 mg/dL   HDL 50.50 >39.00 mg/dL   VLDL 33.6 0.0 - 40.0 mg/dL   LDL Cholesterol 67 0 - 99  mg/dL   Total CHOL/HDL Ratio 3    NonHDL 100.52   Comprehensive metabolic panel  Result Value Ref Range   Sodium 136 135 - 145 mEq/L   Potassium 4.4 3.5 - 5.1 mEq/L   Chloride 100 96 - 112 mEq/L   CO2 27 19 - 32 mEq/L   Glucose, Bld 103 (H) 70 - 99 mg/dL   BUN 14 6 - 23 mg/dL   Creatinine, Ser 1.14 0.40 - 1.50 mg/dL   Total Bilirubin 0.3 0.2 - 1.2 mg/dL   Alkaline Phosphatase 65 39 - 117 U/L   AST 27 0 - 37 U/L   ALT 21 0 - 53 U/L   Total Protein 7.1 6.0 - 8.3 g/dL   Albumin 4.2 3.5 - 5.2 g/dL   GFR 63.17 >60.00 mL/min   Calcium 9.1 8.4 - 10.5 mg/dL   Lab Results  Component Value Date   PSA1 4.4 (H) 03/23/2022   PSA1 4.4 (H) 09/13/2021   PSA1 4.8 (H) 03/10/2021   PSA 4.62 (H) 03/16/2020   PSA 2.69 01/24/2019   PSA 3.00 01/07/2018     Assessment & Plan:   Problem List Items Addressed This Visit     Advanced care planning/counseling discussion (Chronic)    Advanced directive: Received durable power of attorney (wife then daughter) - no healthcare info available. He is working on getting medical side of things completed.       Health maintenance examination - Primary (Chronic)    Preventative protocols reviewed and updated unless pt declined. Discussed healthy diet and lifestyle.  Essential hypertension    Chronic, stable on valsartan + amlodipine - continue.       Relevant Medications   amLODipine (NORVASC) 5 MG tablet   Prediabetes    A1c remains stable in prediabetes range.       Mitral valve regurgitation    Mild on latest echo. Late systolic murmur again heard today.       Relevant Medications   amLODipine (NORVASC) 5 MG tablet   Prostate cancer [facility])    Regularly sees urology with recent reassuring prostate MRI. Discussing rpt biopsy over the next 6 months.       Gastroesophageal reflux disease without esophagitis    Pulm is treating possible GERD component of cough with pantoprazole.       Solitary pulmonary nodule on lung CT    Planned  rpt CT       CAD (coronary artery disease)    Appreciate cardiology care. Discussed benefits of statin.       Relevant Medications   amLODipine (NORVASC) 5 MG tablet     Meds ordered this encounter  Medications   amLODipine (NORVASC) 5 MG tablet    Sig: Take 1 tablet (5 mg total) by mouth daily.    Dispense:  90 tablet    Refill:  4   cyclobenzaprine (FLEXERIL) 10 MG tablet    Sig: Take 1 tablet (10 mg total) by mouth 2 (two) times daily as needed for muscle spasms.    Dispense:  40 tablet    Refill:  0   etodolac (LODINE) 400 MG tablet    Sig: Take 1 tablet (400 mg total) by mouth 2 (two) times daily as needed.    Dispense:  40 tablet    Refill:  0   No orders of the defined types were placed in this encounter.    Patient instructions: Consider RSV vaccine.  Bring me copy of your living will/advanced directive when complete. You are doing well today Return in 6 months for blood pressure follow up.  Return as needed or in 1 year for next wellness visit.   Follow up plan: Return in about 6 months (around 01/08/2023) for follow up visit.  Ria Bush, MD

## 2022-07-10 NOTE — Assessment & Plan Note (Signed)
Preventative protocols reviewed and updated unless pt declined. Discussed healthy diet and lifestyle.  

## 2022-07-10 NOTE — Assessment & Plan Note (Signed)
Advanced directive: Received durable power of attorney (wife then daughter) - no healthcare info available. He is working on getting medical side of things completed.

## 2022-07-10 NOTE — Assessment & Plan Note (Signed)
Appreciate cardiology care. Discussed benefits of statin.

## 2022-07-13 DIAGNOSIS — H40003 Preglaucoma, unspecified, bilateral: Secondary | ICD-10-CM | POA: Diagnosis not present

## 2022-07-21 DIAGNOSIS — C44311 Basal cell carcinoma of skin of nose: Secondary | ICD-10-CM | POA: Diagnosis not present

## 2022-07-24 ENCOUNTER — Telehealth: Payer: Self-pay

## 2022-07-24 ENCOUNTER — Other Ambulatory Visit: Payer: Self-pay | Admitting: Urology

## 2022-07-24 NOTE — Telephone Encounter (Signed)
Encounter notes scanned in from [facility] for your review.

## 2022-08-22 ENCOUNTER — Telehealth: Payer: Self-pay | Admitting: Urology

## 2022-08-22 NOTE — Telephone Encounter (Signed)
Pt called and would to know - What is the lead time for a Confirmatory Biopsy? [phone]

## 2022-08-22 NOTE — Telephone Encounter (Signed)
Spoke to patient and he questioned how long to get an appointment to have a biopsy. I read he is suppose to decide if he wants the biopsy done under sedation or in office with localized numbing. He has still not decided about doing this procedure. I told him it is usually a couple weeks out. He will call back if he decides to do it.

## 2022-09-27 ENCOUNTER — Encounter: Payer: Self-pay | Admitting: *Deleted

## 2022-09-28 ENCOUNTER — Telehealth: Payer: Self-pay | Admitting: Cardiology

## 2022-09-28 NOTE — Telephone Encounter (Signed)
   Pre-operative Risk Assessment    Patient Name: Aaron Brown.  DOB: June 19, 1946 MRN: [id number]      Request for Surgical Clearance    Procedure:   PROSTATE BIOPSY  Date of Surgery:  Clearance 11/02/22                                Surgeon:  NOT INDICATED Surgeon's Group or Practice Name:  [facility] Phone number:  [phone] Fax number:  [phone]  Type of Clearance Requested:   - Pharmacy:  Hold Aspirin 5 DAYS PRIOR TO BIOPSY Not Indicated  Type of Anesthesia:     Additional requests/questions:    Signed, Eli Phillips   09/28/2022, 10:58 AM

## 2022-09-29 ENCOUNTER — Telehealth: Payer: Self-pay

## 2022-09-29 NOTE — Telephone Encounter (Signed)
I left a message for the pt to call back to set up a tele pre op appt.

## 2022-09-29 NOTE — Telephone Encounter (Signed)
Patient is returning call.  °

## 2022-09-29 NOTE — Telephone Encounter (Signed)
Reviewed medications with patient and consent given.

## 2022-09-29 NOTE — Telephone Encounter (Signed)
   Name: Aaron Brown.  DOB: [birth year]  MRN: [id number]  Primary Cardiologist: Kate Sable, MD   Preoperative team, please contact this patient and set up a phone call appointment for further preoperative risk assessment. Please obtain consent and complete medication review. Thank you for your help.  I confirm that guidance regarding antiplatelet and oral anticoagulation therapy has been completed and, if necessary, noted below.  Per office protocol, if patient is without any new symptoms or concerns at the time of their virtual visit, he may hold Aspirin for 5-7 days prior to procedure. Please resume Aspirin as soon as possible postprocedure, at the discretion of the surgeon.    Lenna Sciara, NP 09/29/2022, 3:25 PM [facility]

## 2022-09-29 NOTE — Telephone Encounter (Signed)
  Patient Consent for Virtual Visit        Aaron Brown. has provided verbal consent on 09/29/2022 for a virtual visit (video or telephone).   CONSENT FOR VIRTUAL VISIT FOR:  Aaron Brown.  By participating in this virtual visit I agree to the following:  I hereby voluntarily request, consent and authorize [facility] and its employed or contracted physicians, physician assistants, nurse practitioners or other licensed health care professionals (the Practitioner), to provide me with telemedicine health care services (the "Services") as deemed necessary by the treating Practitioner. I acknowledge and consent to receive the Services by the Practitioner via telemedicine. I understand that the telemedicine visit will involve communicating with the Practitioner through live audiovisual communication technology and the disclosure of certain medical information by electronic transmission. I acknowledge that I have been given the opportunity to request an in-person assessment or other available alternative prior to the telemedicine visit and am voluntarily participating in the telemedicine visit.  I understand that I have the right to withhold or withdraw my consent to the use of telemedicine in the course of my care at any time, without affecting my right to future care or treatment, and that the Practitioner or I may terminate the telemedicine visit at any time. I understand that I have the right to inspect all information obtained and/or recorded in the course of the telemedicine visit and may receive copies of available information for a reasonable fee.  I understand that some of the potential risks of receiving the Services via telemedicine include:  Delay or interruption in medical evaluation due to technological equipment failure or disruption; Information transmitted may not be sufficient (e.g. poor resolution of images) to allow for appropriate medical decision making by the  Practitioner; and/or  In rare instances, security protocols could fail, causing a breach of personal health information.  Furthermore, I acknowledge that it is my responsibility to provide information about my medical history, conditions and care that is complete and accurate to the best of my ability. I acknowledge that Practitioner's advice, recommendations, and/or decision may be based on factors not within their control, such as incomplete or inaccurate data provided by me or distortions of diagnostic images or specimens that may result from electronic transmissions. I understand that the practice of medicine is not an exact science and that Practitioner makes no warranties or guarantees regarding treatment outcomes. I acknowledge that a copy of this consent can be made available to me via my patient portal ([facility]), or I can request a printed copy by calling the office of [facility].    I understand that my insurance will be billed for this visit.   I have read or had this consent read to me. I understand the contents of this consent, which adequately explains the benefits and risks of the Services being provided via telemedicine.  I have been provided ample opportunity to ask questions regarding this consent and the Services and have had my questions answered to my satisfaction. I give my informed consent for the services to be provided through the use of telemedicine in my medical care

## 2022-10-06 ENCOUNTER — Ambulatory Visit: Payer: PPO | Attending: Cardiovascular Disease

## 2022-10-06 DIAGNOSIS — Z01810 Encounter for preprocedural cardiovascular examination: Secondary | ICD-10-CM

## 2022-10-06 NOTE — Progress Notes (Signed)
Virtual Visit via Telephone Note   Because of Aaron HANNINEN Jr.'s co-morbid illnesses, he is at least at moderate risk for complications without adequate follow up.  This format is felt to be most appropriate for this patient at this time.  The patient did not have access to video technology/had technical difficulties with video requiring transitioning to audio format only (telephone).  All issues noted in this document were discussed and addressed.  No physical exam could be performed with this format.  Please refer to the patient's chart for his consent to telehealth for [facility].  Evaluation Performed:  Preoperative cardiovascular risk assessment _____________   Date:  10/06/2022   Patient ID:  Aaron Pines., DOB October 25, 1945, MRN [id number] Patient Location:  Home Provider location:   Office  Primary Care Provider:  Eustaquio Boyden, MD Primary Cardiologist:  Debbe Odea, MD  Chief Complaint / Patient Profile   77 y.o. y/o Brown with a h/o nonobstructive coronary disease, hypertension who is pending prostate biopsy and presents today for telephonic preoperative cardiovascular risk assessment.  History of Present Illness    Aaron Buonocore. is a 77 y.o. Brown who presents via audio/video conferencing for a telehealth visit today.  Pt was last seen in cardiology clinic on 05/18/2022 by Debbe Odea, MD .  At that time Aaron Pines. was doing well .  The patient is now pending procedure as outlined above. Since his last visit, he remains stable from a cardiac standpoint.  Today he denies chest pain, shortness of breath, lower extremity edema, fatigue, palpitations, melena, hematuria, hemoptysis, diaphoresis, weakness, presyncope, syncope, orthopnea, and PND.   Past Medical History    Past Medical History:  Diagnosis Date   Actinic keratosis    Asymmetrical left sensorineural hearing loss 12/2013   s/p MRI pending hearing aides  Willeen Cass)   Basal cell carcinoma 10/21/2019   L ala crease   Basal cell carcinoma 11/11/2018   R chin/excision   Basal cell carcinoma 03/12/2018   L mid forearm   Basal cell carcinoma 05/30/2016   R inf clavicle, L lateral canthus   Basal cell carcinoma 01/06/2014   L elbow   Basal cell carcinoma 11/02/2020   L mid forearm, recurrent. Exc 12/27/2020.   Basal cell carcinoma 05/02/2022   R nasal ala - Mohs 07/21/2022   Blood in stool    in the past; ? hemorrhoids   Cerebral microvascular disease 12/2013   by MRI, referred to neuro by ENT   Colon polyp 05/2005   ?hyperplastic   History of chicken pox    Hypertension    Increased pressure in the eye    Dr. Coralee Rud heart valve    Lower back pain    bulging disc with spinal stenosis L4/5 s/p surgery x2   Prediabetes    A1c 6.0 (2012)   Seasonal allergies    Squamous acanthoma of face    Squamous cell cancer of skin of hand 10/22/2018   R hand dorsum below first webspace   Squamous cell carcinoma of neck 06/26/2013   Right anterior neck/excision   Squamous cell carcinoma of skin 11/17/2019   Left dorsal hand. SCCis hypertrophic.   Past Surgical History:  Procedure Laterality Date   BUNIONECTOMY  2003   Dr. Elvin So   COLONOSCOPY  05/2005   1 polyp, int hem    DOBUTAMINE STRESS ECHO  2013   normal per prior PCP records   hemi-laminectomy  2000   Dr. Jordan LikesPool, Dr. Gerlene FeeKritzer L4/L5   LAMINECTOMY  1988   Dr. Hope Pigeonloninger; L5   SQUAMOUS CELL CARCINOMA EXCISION  2014   face and hand    Allergies  Allergies  Allergen Reactions   Hydrochlorothiazide Other (See Comments)    Mild drop in sodium   Lisinopril     May have caused lip swelling    Home Medications    Prior to Admission medications   Medication Sig Start Date End Date Taking? Authorizing Provider  amLODipine (NORVASC) 5 MG tablet Take 1 tablet (5 mg total) by mouth daily. 07/10/22   Eustaquio BoydenGutierrez, Javier, MD  aspirin EC 81 MG tablet Take 1 tablet (81 mg total) by  mouth daily. Swallow whole. 01/30/22   Debbe OdeaAgbor-Etang, Brian, MD  atorvastatin (LIPITOR) 20 MG tablet Take 1 tablet (20 mg total) by mouth daily. Patient not taking: Reported on 09/29/2022 05/18/22 06/16/23  Debbe OdeaAgbor-Etang, Brian, MD  beta carotene w/minerals (OCUVITE) tablet Take 1 tablet by mouth daily.    [provider]  cholecalciferol (VITAMIN D) 1000 UNITS tablet Take 1,000 Units by mouth daily.    [provider]  cyclobenzaprine (FLEXERIL) 10 MG tablet Take 1 tablet (10 mg total) by mouth 2 (two) times daily as needed for muscle spasms. 07/10/22   Eustaquio BoydenGutierrez, Javier, MD  etodolac (LODINE) 400 MG tablet Take 1 tablet (400 mg total) by mouth 2 (two) times daily as needed. 07/10/22   Eustaquio BoydenGutierrez, Javier, MD  famotidine (PEPCID) 20 MG tablet One after supper 03/23/22   Nyoka CowdenWert, Michael B, MD  pantoprazole (PROTONIX) 40 MG tablet TAKE 1 TABLET (40 MG TOTAL) BY MOUTH DAILY. TAKE 30-60 MIN BEFORE FIRST MEAL OF THE DAY Patient not taking: Reported on 09/29/2022 06/19/22   Nyoka CowdenWert, Michael B, MD  tadalafil (CIALIS) 20 MG tablet TAKE ONE TABLET ONE HOUR PRIOR TO INTERCOURSE 07/24/22   Stoioff, Verna CzechScott C, MD  valsartan (DIOVAN) 160 MG tablet Take 1 tablet (160 mg total) by mouth daily. 03/23/22 03/23/23  Nyoka CowdenWert, Michael B, MD    Physical Exam    Vital Signs:  Aaron PinesStanley D Winfree Jr. does not have vital signs available for review today.  Given telephonic nature of communication, physical exam is limited. AAOx3. NAD. Normal affect.  Speech and respirations are unlabored.  Accessory Clinical Findings    None  Assessment & Plan    1.  Preoperative Cardiovascular Risk Assessment:PROSTATE BIOPSY , [facility]       Primary Cardiologist: Debbe OdeaBrian Agbor-Etang, MD  Chart reviewed as part of pre-operative protocol coverage. Given past medical history and time since last visit, based on ACC/AHA guidelines, Aaron PinesStanley D Suski Jr. would be at acceptable risk for the planned procedure without further  cardiovascular testing.   Per office protocol, if patient is without any new symptoms or concerns at the time of their virtual visit, he may hold Aspirin for 5-7 days prior to procedure. Please resume Aspirin as soon as possible postprocedure, at the discretion of the surgeon.   Patient was advised that if he develops new symptoms prior to surgery to contact our office to arrange a follow-up appointment.  He verbalized understanding.  I will route this recommendation to the requesting party via Epic fax function and remove from pre-op pool.       Time:   Today, I have spent 5 minutes with the patient with telehealth technology discussing medical history, symptoms, and management plan.  Prior to his phone evaluation I spent greater than 10 minutes reviewing  his past medical history and cardiac medications.   Ronney Asters, NP  10/06/2022, 7:53 AM

## 2022-11-02 ENCOUNTER — Encounter: Payer: Self-pay | Admitting: Urology

## 2022-11-02 ENCOUNTER — Ambulatory Visit: Payer: PPO | Admitting: Urology

## 2022-11-02 VITALS — BP 128/75 | HR 87 | Ht 67.5 in | Wt 170.0 lb

## 2022-11-02 DIAGNOSIS — C61 Malignant neoplasm of prostate: Secondary | ICD-10-CM

## 2022-11-02 DIAGNOSIS — Z2989 Encounter for other specified prophylactic measures: Secondary | ICD-10-CM

## 2022-11-02 MED ORDER — LEVOFLOXACIN 500 MG PO TABS
500.0000 mg | ORAL_TABLET | Freq: Once | ORAL | Status: AC
  Administered 2022-11-02: 500 mg via ORAL

## 2022-11-02 MED ORDER — GENTAMICIN SULFATE 40 MG/ML IJ SOLN
80.0000 mg | Freq: Once | INTRAMUSCULAR | Status: AC
  Administered 2022-11-02: 80 mg via INTRAMUSCULAR

## 2022-11-02 NOTE — Progress Notes (Signed)
   Prostate Biopsy Procedure   Informed consent was obtained after discussing risks/benefits of the procedure.  A time out was performed to ensure correct patient identity.  Urologic history: 1.  Prostate cancer T1c low risk Fusion biopsy 08/2020 PI-RADS 4/PI-RADS 3 lesions; 46 g gland PSA 5.1 ROI lesions benign Gleason 3+3 adenocarcinoma RLM, RA, RLA (5%, 20%, 5%)   Pre-Procedure: Presents for confirmatory biopsy; PSA 03/23/2022 4.4 MRI performed 05/11/2022 showed no lesions suspicious for high-grade prostate cancer  - Levaquin 500 mg administered PO -Transrectal Ultrasound performed revealing a 53 gm prostate -No significant hypoechoic or median lobe noted  Procedure: - Prostate block performed using 10 cc 1% lidocaine and biopsies taken from sextant areas, a total of 12 under ultrasound guidance.  Post-Procedure: - Patient tolerated the procedure well - He was counseled to seek immediate medical attention if experiences any severe pain, significant bleeding, or fevers - Will call with biopsy results   Irineo Axon, MD

## 2022-11-02 NOTE — Addendum Note (Signed)
Addended by: Honor Loh on: 11/02/2022 02:55 PM   Modules accepted: Orders

## 2022-11-09 ENCOUNTER — Telehealth: Payer: Self-pay

## 2022-11-09 NOTE — Telephone Encounter (Signed)
Patient called wanting to know status of Prostate Biopsy. Please call with an update.

## 2022-11-10 NOTE — Telephone Encounter (Signed)
LMOM informed patient the result is not back yet. I am watching for the result and will call as soon as it comes in.

## 2022-11-12 ENCOUNTER — Telehealth: Payer: Self-pay | Admitting: Urology

## 2022-11-12 NOTE — Telephone Encounter (Signed)
Patient is confirmatory biopsy showed no evidence of cancer.  The purpose of the confirmatory biopsy is to make sure there is no higher grade tumor present or increased volume of tumor.  Since all biopsies were negative this confirms his tumor volume is low risk and active surveillance is the preferred option.  Let me know if he has any questions.  Please schedule 46-month follow-up with PSA.

## 2022-11-13 NOTE — Telephone Encounter (Signed)
Notified patient as instructed, patient pleased. Discussed follow-up appointments, patient agrees  

## 2022-11-22 ENCOUNTER — Ambulatory Visit: Payer: PPO | Admitting: Dermatology

## 2022-11-22 VITALS — BP 134/83 | HR 78

## 2022-11-22 DIAGNOSIS — I781 Nevus, non-neoplastic: Secondary | ICD-10-CM | POA: Diagnosis not present

## 2022-11-22 DIAGNOSIS — X32XXXA Exposure to sunlight, initial encounter: Secondary | ICD-10-CM | POA: Diagnosis not present

## 2022-11-22 DIAGNOSIS — L570 Actinic keratosis: Secondary | ICD-10-CM

## 2022-11-22 DIAGNOSIS — W908XXA Exposure to other nonionizing radiation, initial encounter: Secondary | ICD-10-CM

## 2022-11-22 DIAGNOSIS — Z5111 Encounter for antineoplastic chemotherapy: Secondary | ICD-10-CM | POA: Diagnosis not present

## 2022-11-22 DIAGNOSIS — L578 Other skin changes due to chronic exposure to nonionizing radiation: Secondary | ICD-10-CM

## 2022-11-22 MED ORDER — FLUOROURACIL 5 % EX CREA: TOPICAL_CREAM | CUTANEOUS | 0 refills | Status: AC

## 2022-11-22 MED ORDER — CALCIPOTRIENE 0.005 % EX CREA: TOPICAL_CREAM | CUTANEOUS | 0 refills | Status: AC

## 2022-11-22 NOTE — Progress Notes (Unsigned)
   Follow-Up Visit   Subjective  Aaron Brown. is a 77 y.o. male who presents for the following: Spots on the nose, improved today, but had bumps and scale that bled. Pt is concerned due to his hx of skin CA on the nose.   The patient has spots, moles and lesions to be evaluated, some may be new or changing and the patient has concerns that these could be cancer.   The following portions of the chart were reviewed this encounter and updated as appropriate: medications, allergies, medical history  Review of Systems:  No other skin or systemic complaints except as noted in HPI or Assessment and Plan.  Objective  Well appearing patient in no apparent distress; mood and affect are within normal limits.  A focused examination was performed of the following areas: The face  Relevant physical exam findings are noted in the Assessment and Plan.    Assessment & Plan   ACTINIC DAMAGE WITH PRECANCEROUS ACTINIC KERATOSES Counseling for Topical Chemotherapy Management: Patient exhibits: - Severe, confluent actinic changes with pre-cancerous actinic keratoses that is secondary to cumulative UV radiation exposure over time - Condition that is severe; chronic, not at goal. - diffuse scaly erythematous macules and papules with underlying dyspigmentation - Discussed Prescription "Field Treatment" topical Chemotherapy for Severe, Chronic Confluent Actinic Changes with Pre-Cancerous Actinic Keratoses Field treatment involves treatment of an entire area of skin that has confluent Actinic Changes (Sun/ Ultraviolet light damage) and PreCancerous Actinic Keratoses by method of PhotoDynamic Therapy (PDT) and/or prescription Topical Chemotherapy agents such as 5-fluorouracil, 5-fluorouracil/calcipotriene, and/or imiquimod.  The purpose is to decrease the number of clinically evident and subclinical PreCancerous lesions to prevent progression to development of skin cancer by chemically destroying early  precancer changes that may or may not be visible.  It has been shown to reduce the risk of developing skin cancer in the treated area. As a result of treatment, redness, scaling, crusting, and open sores may occur during treatment course. One or more than one of these methods may be used and may have to be used several times to control, suppress and eliminate the PreCancerous changes. Discussed treatment course, expected reaction, and possible side effects. - Recommend daily broad spectrum sunscreen SPF 30+ to sun-exposed areas, reapply every 2 hours as needed.  - Staying in the shade or wearing long sleeves, sun glasses (UVA+UVB protection) and wide brim hats (4-inch brim around the entire circumference of the hat) are also recommended. - Call for new or changing lesions. Apply 5-fluorouracil cream followed by calcipotriene cream twice a day for 4 days to affected areas at the nose.  Apply 5-fluorouracil cream followed by calcipotriene cream twice a day for 7 days to affected areas at the hands (may wait until fall/winter to treat).  Reviewed course of treatment and expected reaction.  Patient advised to expect inflammation and crusting and advised that erosions are possible.  Patient advised to be diligent with sun protection during and after treatment. Handout with details of how to apply medication and what to expect provided. Counseled to keep medication out of reach of children and pets.  TELANGIECTASIA Exam: dilated blood vessel(s)  Treatment Plan: Benign appearing on exam Call for changes  Return for appointment as scheduled.  Maylene Roes, CMA, am acting as scribe for Darden Dates, MD .   Documentation: I have reviewed the above documentation for accuracy and completeness, and I agree with the above.  Darden Dates, MD

## 2022-11-22 NOTE — Patient Instructions (Addendum)
Your prescription was sent to [facility] in [place]. A representative from [employer] will contact you within 2 business hours to verify your address and insurance information to schedule a free delivery. If for any reason you do not receive a phone call from them, please reach out to them. Their phone number is [phone] and their hours are Monday-Friday 9:00 am-5:00 pm.     5-Fluorouracil/Calcipotriene Patient Education   Actinic keratoses are the dry, red scaly spots on the skin caused by sun damage. A portion of these spots can turn into skin cancer with time, and treating them can help prevent development of skin cancer.   Treatment of these spots requires removal of the defective skin cells. There are various ways to remove actinic keratoses, including freezing with liquid nitrogen, treatment with creams, or treatment with a blue light procedure in the office.   5-fluorouracil cream is a topical cream used to treat actinic keratoses. It works by interfering with the growth of abnormal fast-growing skin cells, such as actinic keratoses. These cells peel off and are replaced by healthy ones. THIS CREAM SHOULD BE KEPT OUT OF REACH OF CHILDREN AND PETS AND SHOULD NOT BE USED BY PREGNANT WOMEN.  5-fluorouracil/calcipotriene is a combination of the 5-fluorouracil cream with a vitamin D analog cream called calcipotriene. The calcipotriene alone does not treat actinic keratoses. However, when it is combined with 5-fluorouracil, it helps the 5-fluorouracil treat the actinic keratoses much faster so that the same results can be achieved with a much shorter treatment time.  INSTRUCTIONS FOR 5-FLUOROURACIL/CALCIPOTRIENE CREAM:   5-fluorouracil/calcipotriene cream typically only needs to be used for 4-7 days. A thin layer should be applied twice a day to the treatment areas recommended by your physician.   If your physician prescribed you separate tubes of 5-fluourouracil and  calcipotriene, apply a thin layer of 5-fluorouracil followed by a thin layer of calcipotriene.   Avoid contact with your eyes or nostrils. Avoid applying the cream to your eyelids or lips unless directed to apply there by your physician. Do not use 5-fluorouracil/calcipotriene cream on infected or open wounds.   You will develop redness, irritation and some crusting at areas where you have pre-cancer damage/actinic keratoses. IF YOU DEVELOP PAIN, BLEEDING, OR SIGNIFICANT CRUSTING, STOP THE TREATMENT EARLY - you have already gotten a good response and the actinic keratoses should clear up well.  Wash your hands after applying 5-fluorouracil 5% cream on your skin.   A moisturizer or sunscreen with a minimum SPF 30 should be applied each morning.   Once you have finished the treatment, you can apply a thin layer of Vaseline twice a day to irritated areas to soothe and calm the areas more quickly. If you experience significant discomfort, contact your physician.  For some patients it is necessary to repeat the treatment for best results.  SIDE EFFECTS: When using 5-fluorouracil/calcipotriene cream, you may have mild irritation, such as redness, dryness, swelling, or a mild burning sensation. This usually resolves within 2 weeks. The more actinic keratoses you have, the more redness and inflammation you can expect during treatment. Eye irritation has been reported rarely. If this occurs, please let us know.   If you have any trouble using this cream, please send [place] a MyChart message or call the office. If you have any other questions about this information, please do not hesitate to ask me before you leave the office or contact me on MyChart or by phone.   Due to recent changes  in healthcare laws, you may see results of your pathology and/or laboratory studies on MyChart before the doctors have had a chance to review them. We understand that in some cases there may be results that are confusing or  concerning to you. Please understand that not all results are received at the same time and often the doctors may need to interpret multiple results in order to provide you with the best plan of care or course of treatment. Therefore, we ask that you please give [place] 2 business days to thoroughly review all your results before contacting the office for clarification. Should we see a critical lab result, you will be contacted sooner.   If You Need Anything After Your Visit  If you have any questions or concerns for your doctor, please call our main line at [phone] and press option 4 to reach your doctor's medical assistant. If no one answers, please leave a voicemail as directed and we will return your call as soon as possible. Messages left after 4 pm will be answered the following business day.   You may also send [place] a message via MyChart. We typically respond to MyChart messages within 1-2 business days.  For prescription refills, please ask your pharmacy to contact our office. Our fax number is [phone].  If you have an urgent issue when the clinic is closed that cannot wait until the next business day, you can page your doctor at the number below.    Please note that while we do our best to be available for urgent issues outside of office hours, we are not available 24/7.   If you have an urgent issue and are unable to reach [place], you may choose to seek medical care at your doctor's office, retail clinic, urgent care center, or emergency room.  If you have a medical emergency, please immediately call 911 or go to the emergency department.  Pager Numbers  - Dr. Gwen Pounds: [phone]  - Dr. Neale Burly: [phone]  - Dr. Roseanne Reno: [phone]  In the event of inclement weather, please call our main line at [phone] for an update on the status of any delays or closures.  Dermatology Medication Tips: Please keep the boxes that topical medications come in in order to help keep track  of the instructions about where and how to use these. Pharmacies typically print the medication instructions only on the boxes and not directly on the medication tubes.   If your medication is too expensive, please contact our office at [phone] option 4 or send [place] a message through MyChart.   We are unable to tell what your co-pay for medications will be in advance as this is different depending on your insurance coverage. However, we may be able to find a substitute medication at lower cost or fill out paperwork to get insurance to cover a needed medication.   If a prior authorization is required to get your medication covered by your insurance company, please allow [place] 1-2 business days to complete this process.  Drug prices often vary depending on where the prescription is filled and some pharmacies may offer cheaper prices.  The website www.goodrx.com contains coupons for medications through different pharmacies. The prices here do not account for what the cost may be with help from insurance (it may be cheaper with your insurance), but the website can give you the price if you did not use any insurance.  - You can print the associated coupon and take it with your prescription to  the pharmacy.  - You may also stop by our office during regular business hours and pick up a GoodRx coupon card.  - If you need your prescription sent electronically to a different pharmacy, notify our office through [facility] or by phone at [phone] option 4.     Si Usted Necesita Algo Despus de Su Visita  Tambin puede enviarnos un mensaje a travs de Clinical cytogeneticist. Por lo general respondemos a los mensajes de MyChart en el transcurso de 1 a 2 das hbiles.  Para renovar recetas, por favor pida a su farmacia que se ponga en contacto con nuestra oficina. Annie Sable de fax es [place] [phone].  Si tiene un asunto urgente cuando la clnica est cerrada y que no puede esperar hasta el siguiente da  hbil, puede llamar/localizar a su doctor(a) al nmero que aparece a continuacin.   Por favor, tenga en cuenta que aunque hacemos todo lo posible para estar disponibles para asuntos urgentes fuera del horario de [place], no estamos disponibles las 24 horas del da, los 7 [address]  [address] de la [place].   Si tiene un problema urgente y no puede comunicarse con nosotros, puede optar por buscar atencin mdica  en el consultorio de su doctor(a), en una clnica privada, en un centro de atencin urgente o en una sala de emergencias.  Si tiene Engineer, drilling, por favor llame inmediatamente al 911 o vaya a la sala de emergencias.  Nmeros de bper  - Dr. Gwen Pounds: [phone]  - Dra. Moye: [phone]  - Dra. Roseanne Reno: [phone]  En caso de inclemencias del [place], por favor llame a Lacy Duverney principal al [phone] para una actualizacin sobre el [place] de cualquier retraso o cierre.  Consejos para la medicacin en dermatologa: Por favor, guarde las cajas en las que vienen los medicamentos de uso tpico para ayudarle a seguir las instrucciones sobre dnde y cmo usarlos. Las farmacias generalmente imprimen las instrucciones del medicamento slo en las cajas y no directamente en los tubos del [place].   Si su medicamento es muy caro, por favor, pngase en contacto con Rolm Gala llamando al [phone] y presione la opcin 4 o envenos un mensaje a travs de Clinical cytogeneticist.   No podemos decirle cul ser su copago por los medicamentos por adelantado ya que esto es diferente dependiendo de la cobertura de su seguro. Sin embargo, es posible que podamos encontrar un medicamento sustituto a Audiological scientist un formulario para que el seguro cubra el medicamento que se considera necesario.   Si se requiere una autorizacin previa para que su compaa de seguros [place] su medicamento, por favor permtanos de 1 a 2 das hbiles para completar [address]  Los precios de los medicamentos  varan con frecuencia dependiendo del Environmental consultant de dnde se surte la receta y alguna farmacias pueden ofrecer precios ms baratos.  El sitio web www.goodrx.com tiene cupones para medicamentos de Health and safety inspector. Los precios aqu no tienen en cuenta lo que podra costar con la ayuda del seguro (puede ser ms barato con su seguro), pero el sitio web puede darle el precio si no utiliz Tourist information centre manager.  - Puede imprimir el cupn correspondiente y llevarlo con su receta a la farmacia.  - Tambin puede pasar por nuestra oficina durante el horario de atencin regular y Education officer, museum una tarjeta de cupones de GoodRx.  - Si necesita que su receta se enve electrnicamente a Psychiatrist, informe a nuestra oficina a travs de MyChart de [employer] o por telfono  llamando al [phone] y presione la opcin 4.

## 2022-12-05 ENCOUNTER — Ambulatory Visit: Payer: PPO | Admitting: Dermatology

## 2022-12-05 VITALS — BP 124/81 | HR 75

## 2022-12-05 DIAGNOSIS — L570 Actinic keratosis: Secondary | ICD-10-CM

## 2022-12-05 DIAGNOSIS — Z7189 Other specified counseling: Secondary | ICD-10-CM

## 2022-12-05 DIAGNOSIS — X32XXXA Exposure to sunlight, initial encounter: Secondary | ICD-10-CM

## 2022-12-05 DIAGNOSIS — C4441 Basal cell carcinoma of skin of scalp and neck: Secondary | ICD-10-CM | POA: Diagnosis not present

## 2022-12-05 DIAGNOSIS — Z85828 Personal history of other malignant neoplasm of skin: Secondary | ICD-10-CM

## 2022-12-05 DIAGNOSIS — W908XXA Exposure to other nonionizing radiation, initial encounter: Secondary | ICD-10-CM

## 2022-12-05 DIAGNOSIS — L578 Other skin changes due to chronic exposure to nonionizing radiation: Secondary | ICD-10-CM

## 2022-12-05 DIAGNOSIS — Z5111 Encounter for antineoplastic chemotherapy: Secondary | ICD-10-CM

## 2022-12-05 DIAGNOSIS — D485 Neoplasm of uncertain behavior of skin: Secondary | ICD-10-CM

## 2022-12-05 DIAGNOSIS — C44519 Basal cell carcinoma of skin of other part of trunk: Secondary | ICD-10-CM | POA: Diagnosis not present

## 2022-12-05 NOTE — Progress Notes (Signed)
Follow-Up Visit   Subjective  Aaron Brown. is a 77 y.o. male who presents for the following: 6 month follow-up Aks. He has a few spots to check on the right ear, posterior neck (itchy), and nose. He saw Dr Neale Burly a couple of weeks ago for redness and peeling of his nose. She prescribed 5FU/Calcipotriene cream, but patient hasn't started yet. Patient had Red light PDT to the scalp 05/04/2022. History of BCCs. Mohs to the right nasal ala 07/21/2022.   The following portions of the chart were reviewed this encounter and updated as appropriate: medications, allergies, medical history  Review of Systems:  No other skin or systemic complaints except as noted in HPI or Assessment and Plan.  Objective  Well appearing patient in no apparent distress; mood and affect are within normal limits.   A focused examination was performed of the following areas: Face, scalp  Relevant exam findings are noted in the Assessment and Plan.  L crown x 3, L ear antihelix x 1, R upper ear helix x 2 (6), Left Temple, nose (will treat with 5FU/Calcipotriene) Pink scaly macules.   Posterior Neck 9.0 x 6.0 mm pink ulcerated pearly papule       Assessment & Plan   ACTINIC DAMAGE WITH PRECANCEROUS ACTINIC KERATOSES Counseling for Topical Chemotherapy Management: Patient exhibits: - Severe, confluent actinic changes with pre-cancerous actinic keratoses that is secondary to cumulative UV radiation exposure over time - Condition that is severe; chronic, not at goal. - diffuse scaly erythematous macules and papules with underlying dyspigmentation - Discussed Prescription "Field Treatment" topical Chemotherapy for Severe, Chronic Confluent Actinic Changes with Pre-Cancerous Actinic Keratoses Field treatment involves treatment of an entire area of skin that has confluent Actinic Changes (Sun/ Ultraviolet light damage) and PreCancerous Actinic Keratoses by method of PhotoDynamic Therapy (PDT) and/or  prescription Topical Chemotherapy agents such as 5-fluorouracil, 5-fluorouracil/calcipotriene, and/or imiquimod.  The purpose is to decrease the number of clinically evident and subclinical PreCancerous lesions to prevent progression to development of skin cancer by chemically destroying early precancer changes that may or may not be visible.  It has been shown to reduce the risk of developing skin cancer in the treated area. As a result of treatment, redness, scaling, crusting, and open sores may occur during treatment course. One or more than one of these methods may be used and may have to be used several times to control, suppress and eliminate the PreCancerous changes. Discussed treatment course, expected reaction, and possible side effects. - Recommend daily broad spectrum sunscreen SPF 30+ to sun-exposed areas, reapply every 2 hours as needed.  - Staying in the shade or wearing long sleeves, sun glasses (UVA+UVB protection) and wide brim hats (4-inch brim around the entire circumference of the hat) are also recommended. - Call for new or changing lesions. - Start 5FU/Calcipotriene Cream Apply to nose and temples BID x 5 days. Pt has at home.   HISTORY OF BASAL CELL CARCINOMA OF THE SKIN - No evidence of recurrence today R nasal ala - Recommend regular full body skin exams - Recommend daily broad spectrum sunscreen SPF 30+ to sun-exposed areas, reapply every 2 hours as needed.  - Call if any new or changing lesions are noted between office visits   AK (actinic keratosis) (7) L crown x 3, L ear antihelix x 1, R upper ear helix x 2 (6); Left Temple, nose (will treat with 5FU/Calcipotriene)  Actinic keratoses are precancerous spots that appear secondary to cumulative UV radiation  exposure/sun exposure over time. They are chronic with expected duration over 1 year. A portion of actinic keratoses will progress to squamous cell carcinoma of the skin. It is not possible to reliably predict which spots  will progress to skin cancer and so treatment is recommended to prevent development of skin cancer.  Recommend daily broad spectrum sunscreen SPF 30+ to sun-exposed areas, reapply every 2 hours as needed.  Recommend staying in the shade or wearing long sleeves, sun glasses (UVA+UVB protection) and wide brim hats (4-inch brim around the entire circumference of the hat). Call for new or changing lesions.  Destruction of lesion - L crown x 3, L ear antihelix x 1, R upper ear helix x 2  Destruction method: cryotherapy   Informed consent: discussed and consent obtained   Lesion destroyed using liquid nitrogen: Yes   Region frozen until ice ball extended beyond lesion: Yes   Outcome: patient tolerated procedure well with no complications   Post-procedure details: wound care instructions given   Additional details:  Prior to procedure, discussed risks of blister formation, small wound, skin dyspigmentation, or rare scar following cryotherapy. Recommend Vaseline ointment to treated areas while healing.   Neoplasm of uncertain behavior of skin Posterior Neck  Skin / nail biopsy Type of biopsy: tangential   Informed consent: discussed and consent obtained   Patient was prepped and draped in usual sterile fashion: Area prepped with alcohol. Anesthesia: the lesion was anesthetized in a standard fashion   Anesthetic:  1% lidocaine w/ epinephrine 1-100,000 buffered w/ 8.4% NaHCO3 Instrument used: flexible razor blade   Hemostasis achieved with: pressure, aluminum chloride and electrodesiccation   Outcome: patient tolerated procedure well    Destruction of lesion  Destruction method: electrodesiccation and curettage   Informed consent: discussed and consent obtained   Curettage performed in three different directions: Yes   Electrodesiccation performed over the curetted area: Yes   Final wound size (cm):  1.3 Hemostasis achieved with:  pressure, aluminum chloride and  electrodesiccation Outcome: patient tolerated procedure well with no complications   Post-procedure details: wound care instructions given   Post-procedure details comment:  Ointment and bandage applied.  Specimen 1 - Surgical pathology Differential Diagnosis: BCC vs other Check Margins: No EDC today    Return in about 6 months (around 06/06/2023) for UBSE, Hx AKs, Hx BCC.  ICherlyn Labella, CMA, am acting as scribe for Willeen Niece, MD .   Documentation: I have reviewed the above documentation for accuracy and completeness, and I agree with the above.  Willeen Niece, MD

## 2022-12-05 NOTE — Patient Instructions (Addendum)
5-Fluorouracil/Calcipotriene Patient Education   Actinic keratoses are the dry, red scaly spots on the skin caused by sun damage. A portion of these spots can turn into skin cancer with time, and treating them can help prevent development of skin cancer.   Treatment of these spots requires removal of the defective skin cells. There are various ways to remove actinic keratoses, including freezing with liquid nitrogen, treatment with creams, or treatment with a blue light procedure in the office.   5-fluorouracil cream is a topical cream used to treat actinic keratoses. It works by interfering with the growth of abnormal fast-growing skin cells, such as actinic keratoses. These cells peel off and are replaced by healthy ones.   5-fluorouracil/calcipotriene is a combination of the 5-fluorouracil cream with a vitamin D analog cream called calcipotriene. The calcipotriene alone does not treat actinic keratoses. However, when it is combined with 5-fluorouracil, it helps the 5-fluorouracil treat the actinic keratoses much faster so that the same results can be achieved with a much shorter treatment time.  INSTRUCTIONS FOR 5-FLUOROURACIL/CALCIPOTRIENE CREAM:   5-fluorouracil/calcipotriene cream typically only needs to be used for 4-5 days. A thin layer should be applied twice a day to the treatment areas recommended by your physician (nose and temples).   If your physician prescribed you separate tubes of 5-fluourouracil and calcipotriene, apply a thin layer of 5-fluorouracil followed by a thin layer of calcipotriene.   Avoid contact with your eyes, nostrils, and mouth. Do not use 5-fluorouracil/calcipotriene cream on infected or open wounds.   You will develop redness, irritation and some crusting at areas where you have pre-cancer damage/actinic keratoses. IF YOU DEVELOP PAIN, BLEEDING, OR SIGNIFICANT CRUSTING, STOP THE TREATMENT EARLY - you have already gotten a good response and the actinic keratoses  should clear up well.  Wash your hands after applying 5-fluorouracil 5% cream on your skin.   A moisturizer or sunscreen with a minimum SPF 30 should be applied each morning.   Once you have finished the treatment, you can apply a thin layer of Vaseline twice a day to irritated areas to soothe and calm the areas more quickly. If you experience significant discomfort, contact your physician.  For some patients it is necessary to repeat the treatment for best results.  SIDE EFFECTS: When using 5-fluorouracil/calcipotriene cream, you may have mild irritation, such as redness, dryness, swelling, or a mild burning sensation. This usually resolves within 2 weeks. The more actinic keratoses you have, the more redness and inflammation you can expect during treatment. Eye irritation has been reported rarely. If this occurs, please let us know.  If you have any trouble using this cream, please call the office. If you have any other questions about this information, please do not hesitate to ask me before you leave the office.   Wound Care Instructions  Cleanse wound gently with soap and water once a day then pat dry with clean gauze. Apply a thin coat of Petrolatum (petroleum jelly, "Vaseline") over the wound (unless you have an allergy to this). We recommend that you use a new, sterile tube of Vaseline. Do not pick or remove scabs. Do not remove the yellow or white "healing tissue" from the base of the wound.  Cover the wound with fresh, clean, nonstick gauze and secure with paper tape. You may use Band-Aids in place of gauze and tape if the wound is small enough, but would recommend trimming much of the tape off as there is often too much. Sometimes Band-Aids can  irritate the skin.  You should call the office for your biopsy report after 1 week if you have not already been contacted.  If you experience any problems, such as abnormal amounts of bleeding, swelling, significant bruising, significant pain,  or evidence of infection, please call the office immediately.  FOR ADULT SURGERY PATIENTS: If you need something for pain relief you may take 1 extra strength Tylenol (acetaminophen) AND 2 Ibuprofen (200mg  each) together every 4 hours as needed for pain. (do not take these if you are allergic to them or if you have a reason you should not take them.) Typically, you may only need pain medication for 1 to 3 days.    Due to recent changes in healthcare laws, you may see results of your pathology and/or laboratory studies on MyChart before the doctors have had a chance to review them. We understand that in some cases there may be results that are confusing or concerning to you. Please understand that not all results are received at the same time and often the doctors may need to interpret multiple results in order to provide you with the best plan of care or course of treatment. Therefore, we ask that you please give [place] 2 business days to thoroughly review all your results before contacting the office for clarification. Should we see a critical lab result, you will be contacted sooner.   If You Need Anything After Your Visit  If you have any questions or concerns for your doctor, please call our main line at [phone] and press option 4 to reach your doctor's medical assistant. If no one answers, please leave a voicemail as directed and we will return your call as soon as possible. Messages left after 4 pm will be answered the following business day.   You may also send [place] a message via MyChart. We typically respond to MyChart messages within 1-2 business days.  For prescription refills, please ask your pharmacy to contact our office. Our fax number is [phone].  If you have an urgent issue when the clinic is closed that cannot wait until the next business day, you can page your doctor at the number below.    Please note that while we do our best to be available for urgent issues outside of  office hours, we are not available 24/7.   If you have an urgent issue and are unable to reach [place], you may choose to seek medical care at your doctor's office, retail clinic, urgent care center, or emergency room.  If you have a medical emergency, please immediately call 911 or go to the emergency department.  Pager Numbers  - Dr. Gwen Pounds: [phone]  - Dr. Neale Burly: [phone]  - Dr. Roseanne Reno: [phone]  In the event of inclement weather, please call our main line at [phone] for an update on the status of any delays or closures.  Dermatology Medication Tips: Please keep the boxes that topical medications come in in order to help keep track of the instructions about where and how to use these. Pharmacies typically print the medication instructions only on the boxes and not directly on the medication tubes.   If your medication is too expensive, please contact our office at [phone] option 4 or send [place] a message through MyChart.   We are unable to tell what your co-pay for medications will be in advance as this is different depending on your insurance coverage. However, we may be able to find a substitute medication at lower cost  or fill out paperwork to get insurance to cover a needed medication.   If a prior authorization is required to get your medication covered by your insurance company, please allow [place] 1-2 business days to complete this process.  Drug prices often vary depending on where the prescription is filled and some pharmacies may offer cheaper prices.  The website www.goodrx.com contains coupons for medications through different pharmacies. The prices here do not account for what the cost may be with help from insurance (it may be cheaper with your insurance), but the website can give you the price if you did not use any insurance.  - You can print the associated coupon and take it with your prescription to the pharmacy.  - You may also stop by our office during  regular business hours and pick up a GoodRx coupon card.  - If you need your prescription sent electronically to a different pharmacy, notify our office through [facility] or by phone at [phone] option 4.     Si Usted Necesita Algo Despus de Su Visita  Tambin puede enviarnos un mensaje a travs de Clinical cytogeneticist. Por lo general respondemos a los mensajes de MyChart en el transcurso de 1 a 2 das hbiles.  Para renovar recetas, por favor pida a su farmacia que se ponga en contacto con nuestra oficina. Annie Sable de fax es [place] [phone].  Si tiene un asunto urgente cuando la clnica est cerrada y que no puede esperar hasta el siguiente da hbil, puede llamar/localizar a su doctor(a) al nmero que aparece a continuacin.   Por favor, tenga en cuenta que aunque hacemos todo lo posible para estar disponibles para asuntos urgentes fuera del horario de [place], no estamos disponibles las 24 horas del da, los 7 [address]  [address] de la [place].   Si tiene un problema urgente y no puede comunicarse con nosotros, puede optar por buscar atencin mdica  en el consultorio de su doctor(a), en una clnica privada, en un centro de atencin urgente o en una sala de emergencias.  Si tiene Engineer, drilling, por favor llame inmediatamente al 911 o vaya a la sala de emergencias.  Nmeros de bper  - Dr. Gwen Pounds: [phone]  - Dra. Moye: [phone]  - Dra. Roseanne Reno: [phone]  En caso de inclemencias del [place], por favor llame a Lacy Duverney principal al [phone] para una actualizacin sobre el [place] de cualquier retraso o cierre.  Consejos para la medicacin en dermatologa: Por favor, guarde las cajas en las que vienen los medicamentos de uso tpico para ayudarle a seguir las instrucciones sobre dnde y cmo usarlos. Las farmacias generalmente imprimen las instrucciones del medicamento slo en las cajas y no directamente en los tubos del [place].   Si su medicamento es muy  caro, por favor, pngase en contacto con Rolm Gala llamando al [phone] y presione la opcin 4 o envenos un mensaje a travs de Clinical cytogeneticist.   No podemos decirle cul ser su copago por los medicamentos por adelantado ya que esto es diferente dependiendo de la cobertura de su seguro. Sin embargo, es posible que podamos encontrar un medicamento sustituto a Audiological scientist un formulario para que el seguro cubra el medicamento que se considera necesario.   Si se requiere una autorizacin previa para que su compaa de seguros [place] su medicamento, por favor permtanos de 1 a 2 das hbiles para completar [address]  Los precios de los medicamentos varan con frecuencia dependiendo del Environmental consultant de dnde se surte la  receta y alguna farmacias pueden ofrecer precios ms baratos.  El sitio web www.goodrx.com tiene cupones para medicamentos de Health and safety inspector. Los precios aqu no tienen en cuenta lo que podra costar con la ayuda del seguro (puede ser ms barato con su seguro), pero el sitio web puede darle el precio si no utiliz Tourist information centre manager.  - Puede imprimir el cupn correspondiente y llevarlo con su receta a la farmacia.  - Tambin puede pasar por nuestra oficina durante el horario de atencin regular y Education officer, museum una tarjeta de cupones de GoodRx.  - Si necesita que su receta se enve electrnicamente a una farmacia diferente, informe a nuestra oficina a travs de MyChart de [place] o por telfono llamando al [phone] y presione la opcin 4.

## 2022-12-11 ENCOUNTER — Telehealth: Payer: Self-pay

## 2022-12-11 NOTE — Telephone Encounter (Signed)
Advised pt of bx results/sh ?

## 2022-12-11 NOTE — Telephone Encounter (Signed)
-----   Message from Willeen Niece, MD sent at 12/11/2022 12:52 PM EDT ----- Skin , posterior neck BASAL CELL CARCINOMA, NODULAR AND INFILTRATIVE PATTERNS  BCC skin cancer- already treated with EDC at time of biopsy    - please call patient

## 2023-01-08 ENCOUNTER — Encounter: Payer: Self-pay | Admitting: Family Medicine

## 2023-01-08 ENCOUNTER — Ambulatory Visit (INDEPENDENT_AMBULATORY_CARE_PROVIDER_SITE_OTHER): Payer: PPO | Admitting: Family Medicine

## 2023-01-08 VITALS — BP 145/72 | HR 77 | Temp 97.3°F | Ht 67.5 in | Wt 172.1 lb

## 2023-01-08 DIAGNOSIS — I2510 Atherosclerotic heart disease of native coronary artery without angina pectoris: Secondary | ICD-10-CM | POA: Diagnosis not present

## 2023-01-08 DIAGNOSIS — R7303 Prediabetes: Secondary | ICD-10-CM | POA: Diagnosis not present

## 2023-01-08 DIAGNOSIS — C61 Malignant neoplasm of prostate: Secondary | ICD-10-CM | POA: Diagnosis not present

## 2023-01-08 DIAGNOSIS — I10 Essential (primary) hypertension: Secondary | ICD-10-CM | POA: Diagnosis not present

## 2023-01-08 MED ORDER — VALSARTAN 160 MG PO TABS
160.0000 mg | ORAL_TABLET | Freq: Every day | ORAL | 2 refills | Status: DC
Start: 2023-01-08 — End: 2023-12-10

## 2023-01-08 NOTE — Assessment & Plan Note (Addendum)
Saw cardiology with nonobstructive CAD, calcium score 400s. Decided not to start atorvastatin.

## 2023-01-08 NOTE — Progress Notes (Signed)
Ph: [phone] Fax: [phone]   Patient ID: Aaron Pines., male    DOB: 01/31/1946, 77 y.o.   MRN: [id number]  This visit was conducted in person.  BP (!) 145/72 (BP Location: Right Arm, Cuff Size: Normal)   Pulse 77   Temp (!) 97.3 F (36.3 C) (Temporal)   Ht 5' 7.5" (1.715 m)   Wt 172 lb 2 oz (78.1 kg)   SpO2 98%   BMI 26.56 kg/m   BP Readings from Last 3 Encounters:  01/08/23 (!) 145/72  12/05/22 124/81  11/22/22 134/83   CC: 6 mo f/u visit  Subjective:   HPI: Aaron Hrabak. is a 77 y.o. male presenting on 01/08/2023 for Medical Management of Chronic Issues (Here for 6 mo f/u.)   HTN - Compliant with current antihypertensive regimen of amlodipine 5mg  at bedtime and valsartan 160mg  daily. Does check blood pressures at home and brings log: 106-146/70-80, HR 60-80s. No low blood pressure readings or symptoms of dizziness/syncope. Denies HA, vision changes, CP/tightness, SOB, leg swelling.    HLD - decided not to try atorvastatin, concerned with side effects.  He's not taking pantoprazole - no significant GERD symptoms.   Low risk prostate cancer managed with active surveillance - confirmatory biopsy 11/2022 showed on evidence of cancer [facility]) - continue active surveillance.   Recent basal cell cancer posterior neck s/p treatment 12/2022 [facility]).   Notes mid day fatigue over the past 6 months. Good restorative sleep.  He does take ocuvite with beta carotene.  Non smoker.      Relevant past medical, surgical, family and social history reviewed and updated as indicated. Interim medical history since our last visit reviewed. Allergies and medications reviewed and updated. Outpatient Medications Prior to Visit  Medication Sig Dispense Refill   amLODipine (NORVASC) 5 MG tablet Take 1 tablet (5 mg total) by mouth daily. 90 tablet 4   aspirin EC 81 MG tablet Take 1 tablet (81 mg total) by mouth daily. Swallow whole.     beta carotene w/minerals  (OCUVITE) tablet Take 1 tablet by mouth daily.     calcipotriene (DOVONOX) 0.005 % cream Apply to the nose BID x 4 days. 60 g 0   cholecalciferol (VITAMIN D) 1000 UNITS tablet Take 1,000 Units by mouth daily.     cyclobenzaprine (FLEXERIL) 10 MG tablet Take 1 tablet (10 mg total) by mouth 2 (two) times daily as needed for muscle spasms. 40 tablet 0   etodolac (LODINE) 400 MG tablet Take 1 tablet (400 mg total) by mouth 2 (two) times daily as needed. 40 tablet 0   famotidine (PEPCID) 20 MG tablet One after supper 30 tablet 11   fluorouracil (EFUDEX) 5 % cream Apply to nose BID x 4 days. 40 g 0   tadalafil (CIALIS) 20 MG tablet TAKE ONE TABLET ONE HOUR PRIOR TO INTERCOURSE 10 tablet 0   valsartan (DIOVAN) 160 MG tablet Take 1 tablet (160 mg total) by mouth daily. 30 tablet 11   atorvastatin (LIPITOR) 20 MG tablet Take 1 tablet (20 mg total) by mouth daily. (Patient not taking: Reported on 01/08/2023) 30 tablet 11   pantoprazole (PROTONIX) 40 MG tablet TAKE 1 TABLET (40 MG TOTAL) BY MOUTH DAILY. TAKE 30-60 MIN BEFORE FIRST MEAL OF THE DAY 90 tablet 0   No facility-administered medications prior to visit.     Per HPI unless specifically indicated in ROS section below Review of Systems  Objective:  BP (!) 145/72 (BP  Location: Right Arm, Cuff Size: Normal)   Pulse 77   Temp (!) 97.3 F (36.3 C) (Temporal)   Ht 5' 7.5" (1.715 m)   Wt 172 lb 2 oz (78.1 kg)   SpO2 98%   BMI 26.56 kg/m   Wt Readings from Last 3 Encounters:  01/08/23 172 lb 2 oz (78.1 kg)  11/02/22 170 lb (77.1 kg)  07/10/22 173 lb 6 oz (78.6 kg)      Physical Exam Vitals and nursing note reviewed.  Constitutional:      Appearance: Normal appearance. He is not ill-appearing.  HENT:     Mouth/Throat:     Mouth: Mucous membranes are moist.     Pharynx: Oropharynx is clear. No oropharyngeal exudate or posterior oropharyngeal erythema.  Eyes:     Extraocular Movements: Extraocular movements intact.     Conjunctiva/sclera:  Conjunctivae normal.     Pupils: Pupils are equal, round, and reactive to light.  Neck:     Thyroid: No thyroid mass or thyromegaly.  Cardiovascular:     Rate and Rhythm: Normal rate and regular rhythm.     Pulses: Normal pulses.     Heart sounds: Normal heart sounds. No murmur heard. Pulmonary:     Effort: Pulmonary effort is normal. No respiratory distress.     Breath sounds: Normal breath sounds. No wheezing, rhonchi or rales.  Musculoskeletal:     Right lower leg: No edema.     Left lower leg: No edema.  Skin:    General: Skin is warm and dry.     Findings: No rash.  Neurological:     Mental Status: He is alert.  Psychiatric:        Mood and Affect: Mood normal.        Behavior: Behavior normal.       Results for orders placed or performed in visit on 07/04/22  Hemoglobin A1c  Result Value Ref Range   Hgb A1c MFr Bld 6.2 4.6 - 6.5 %  Lipid panel  Result Value Ref Range   Cholesterol 151 0 - 200 mg/dL   Triglycerides 960.4 (H) 0.0 - 149.0 mg/dL   HDL 54.09 >81.19 mg/dL   VLDL 14.7 0.0 - 82.9 mg/dL   LDL Cholesterol 67 0 - 99 mg/dL   Total CHOL/HDL Ratio 3    NonHDL 100.52   Comprehensive metabolic panel  Result Value Ref Range   Sodium 136 135 - 145 mEq/L   Potassium 4.4 3.5 - 5.1 mEq/L   Chloride 100 96 - 112 mEq/L   CO2 27 19 - 32 mEq/L   Glucose, Bld 103 (H) 70 - 99 mg/dL   BUN 14 6 - 23 mg/dL   Creatinine, Ser 5.62 0.40 - 1.50 mg/dL   Total Bilirubin 0.3 0.2 - 1.2 mg/dL   Alkaline Phosphatase 65 39 - 117 U/L   AST 27 0 - 37 U/L   ALT 21 0 - 53 U/L   Total Protein 7.1 6.0 - 8.3 g/dL   Albumin 4.2 3.5 - 5.2 g/dL   GFR 13.08 >65.78 mL/min   Calcium 9.1 8.4 - 10.5 mg/dL    Assessment & Plan:   Problem List Items Addressed This Visit     Essential hypertension - Primary    Chronic, adequate on current regimen - continue.       Relevant Medications   valsartan (DIOVAN) 160 MG tablet   Prediabetes    Continue to encourage limiting added sugars in  diet to keep  sugars under control.       Prostate cancer [facility])    Low risk prostate cancer monitoring with active surveillance by urology [facility]). Appreciate their care.       CAD (coronary artery disease)    Saw cardiology with nonobstructive CAD, calcium score 400s. Decided not to start atorvastatin.       Relevant Medications   valsartan (DIOVAN) 160 MG tablet     Meds ordered this encounter  Medications   valsartan (DIOVAN) 160 MG tablet    Sig: Take 1 tablet (160 mg total) by mouth daily.    Dispense:  90 tablet    Refill:  2    No orders of the defined types were placed in this encounter.   Patient Instructions  You are doing well. Continue watching sugar intake.  Continue watching blood pressures, let us know if consistently >150/90 Return as needed or in 6 months for next physical/wellness visit  Follow up plan: Return in about 6 months (around 07/11/2023) for annual exam, prior fasting for blood work, medicare wellness visit.  Eustaquio Boyden, MD

## 2023-01-08 NOTE — Assessment & Plan Note (Signed)
Continue to encourage limiting added sugars in diet to keep sugars under control.

## 2023-01-08 NOTE — Assessment & Plan Note (Signed)
Chronic, adequate on current regimen - continue.  

## 2023-01-08 NOTE — Patient Instructions (Addendum)
You are doing well. Continue watching sugar intake.  Continue watching blood pressures, let us know if consistently >150/90 Return as needed or in 6 months for next physical/wellness visit

## 2023-01-08 NOTE — Assessment & Plan Note (Signed)
Low risk prostate cancer monitoring with active surveillance by urology [place]). Appreciate their care.

## 2023-01-23 DIAGNOSIS — H40153 Residual stage of open-angle glaucoma, bilateral: Secondary | ICD-10-CM | POA: Diagnosis not present

## 2023-03-13 ENCOUNTER — Ambulatory Visit (INDEPENDENT_AMBULATORY_CARE_PROVIDER_SITE_OTHER): Payer: PPO

## 2023-03-13 VITALS — BP 143/80 | HR 87 | Temp 97.3°F | Ht 68.5 in | Wt 171.0 lb

## 2023-03-13 DIAGNOSIS — Z0000 Encounter for general adult medical examination without abnormal findings: Secondary | ICD-10-CM

## 2023-03-13 NOTE — Progress Notes (Signed)
Subjective:   Aaron Brown. is a 77 y.o. male who presents for Medicare Annual/Subsequent preventive examination.  Visit Complete: Virtual  I connected with  Rainey Pines. on 03/13/23 by a audio enabled telemedicine application and verified that I am speaking with the correct person using two identifiers.  Patient Location: Home  Provider Location: Office/Clinic  I discussed the limitations of evaluation and management by telemedicine. The patient expressed understanding and agreed to proceed.  Vital Signs: Because this visit was a virtual/telehealth visit, some criteria may be missing or patient reported. Any vitals not documented were not able to be obtained and vitals that have been documented are patient reported.    Review of Systems      Cardiac Risk Factors include: advanced age (>25men, >70 women);hypertension;male gender     Objective:    Today's Vitals   03/13/23 1422  BP: (!) 143/80  Pulse: 87  Temp: (!) 97.3 F (36.3 C)  Weight: 171 lb (77.6 kg)  Height: 5' 8.5" (1.74 m)   Body mass index is 25.62 kg/m.     03/13/2023    2:30 PM 05/17/2022    1:27 PM 05/05/2022    8:49 AM 05/04/2021    9:52 AM 03/16/2020    1:22 PM 06/21/2018    9:36 PM 01/07/2018   11:07 AM  Advanced Directives  Does Patient Have a Medical Advance Directive? Yes Yes Yes Yes Yes No No;Yes  Type of Estate agent of [place];Living will Healthcare Power of [place];Living will Healthcare Power of [place];Living will Healthcare Power of [place];Living will Healthcare Power of [place];Living will  Healthcare Power of [place];Living will  Does patient want to make changes to medical advance directive? No - Patient declined No - Patient declined  Yes (MAU/Ambulatory/Procedural Areas - Information given)     Copy of Healthcare Power of Attorney in Chart? Yes - validated most recent copy scanned in chart (See row information) No - copy requested Yes - validated  most recent copy scanned in chart (See row information) Yes - validated most recent copy scanned in chart (See row information) Yes - validated most recent copy scanned in chart (See row information)  Yes  Would patient like information on creating a medical advance directive?      No - Patient declined No - Patient declined    Current Medications (verified) Outpatient Encounter Medications as of 03/13/2023  Medication Sig   amLODipine (NORVASC) 5 MG tablet Take 1 tablet (5 mg total) by mouth daily.   aspirin EC 81 MG tablet Take 1 tablet (81 mg total) by mouth daily. Swallow whole.   beta carotene w/minerals (OCUVITE) tablet Take 1 tablet by mouth daily.   cholecalciferol (VITAMIN D) 1000 UNITS tablet Take 1,000 Units by mouth daily.   cyclobenzaprine (FLEXERIL) 10 MG tablet Take 1 tablet (10 mg total) by mouth 2 (two) times daily as needed for muscle spasms.   etodolac (LODINE) 400 MG tablet Take 1 tablet (400 mg total) by mouth 2 (two) times daily as needed.   famotidine (PEPCID) 20 MG tablet One after supper   tadalafil (CIALIS) 20 MG tablet TAKE ONE TABLET ONE HOUR PRIOR TO INTERCOURSE   valsartan (DIOVAN) 160 MG tablet Take 1 tablet (160 mg total) by mouth daily.   calcipotriene (DOVONOX) 0.005 % cream Apply to the nose BID x 4 days. (Patient not taking: Reported on 03/13/2023)   fluorouracil (EFUDEX) 5 % cream Apply to nose BID x 4 days. (  Patient not taking: Reported on 03/13/2023)   No facility-administered encounter medications on file as of 03/13/2023.    Allergies (verified) Hydrochlorothiazide and Lisinopril   History: Past Medical History:  Diagnosis Date   Actinic keratosis    Asymmetrical left sensorineural hearing loss 12/2013   s/p MRI pending hearing aides [facility])   Basal cell carcinoma 10/21/2019   L ala crease   Basal cell carcinoma 11/11/2018   R chin/excision   Basal cell carcinoma 03/12/2018   L mid forearm   Basal cell carcinoma 05/30/2016   R inf clavicle,  L lateral canthus   Basal cell carcinoma 01/06/2014   L elbow   Basal cell carcinoma 11/02/2020   L mid forearm, recurrent. Exc 12/27/2020.   Basal cell carcinoma 05/02/2022   R nasal ala - Mohs 07/21/2022   Basal cell carcinoma 12/05/2022   Posterior neck, EDC   Blood in stool    in the past; ? hemorrhoids   Cerebral microvascular disease 12/2013   by MRI, referred to neuro by ENT   Colon polyp 05/2005   ?hyperplastic   History of chicken pox    Hypertension    Increased pressure in the eye    Dr. Coralee Rud heart valve    Lower back pain    bulging disc with spinal stenosis L4/5 s/p surgery x2   Prediabetes    A1c 6.0 (2012)   Seasonal allergies    Squamous acanthoma of face    Squamous cell cancer of skin of hand 10/22/2018   R hand dorsum below first webspace   Squamous cell carcinoma of neck 06/26/2013   Right anterior neck/excision   Squamous cell carcinoma of skin 11/17/2019   Left dorsal hand. SCCis hypertrophic.   Past Surgical History:  Procedure Laterality Date   BUNIONECTOMY  2003   Dr. Elvin So   COLONOSCOPY  05/2005   1 polyp, int hem    DOBUTAMINE STRESS ECHO  2013   normal per prior PCP records   hemi-laminectomy  2000   Dr. Jordan Likes, Dr. Gerlene Fee L4/L5   LAMINECTOMY  1988   Dr. Hope Pigeon; L5   SQUAMOUS CELL CARCINOMA EXCISION  2014   face and hand   Family History  Problem Relation Age of Onset   Hypertension Mother    Stroke Mother    Arthritis Father    Hypertension Father    Cancer Maternal Aunt        colon   Alcohol abuse Maternal Grandfather    Cancer Cousin        colon   Cancer Cousin        breast   Social History   Socioeconomic History   Marital status: Married    Spouse name: Not on file   Number of children: Not on file   Years of education: Not on file   Highest education level: Bachelor's degree (e.g., BA, AB, BS)  Occupational History   Not on file  Tobacco Use   Smoking status: Never   Smokeless tobacco: Never   Vaping Use   Vaping status: Never Used  Substance and Sexual Activity   Alcohol use: Yes    Alcohol/week: 0.0 standard drinks of alcohol    Comment: Occasional-wine   Drug use: No   Sexual activity: Yes  Other Topics Concern   Not on file  Social History Narrative   Lives with wife   grown children   Occupation: was Emergency planning/management officer, 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  Edu: College BA   Activity: stays active at work   Diet: good water, fruits/vegetables some   Social Determinants of Health   Financial Resource Strain: Low Risk  (03/13/2023)   Overall Financial Resource Strain (CARDIA)    Difficulty of Paying Living Expenses: Not hard at all  Food Insecurity: No Food Insecurity (03/13/2023)   Hunger Vital Sign    Worried About Running Out of Food in the Last Year: Never true    Ran Out of Food in the Last Year: Never true  Transportation Needs: No Transportation Needs (03/13/2023)   PRAPARE - Administrator, Civil Service (Medical): No    Lack of Transportation (Non-Medical): No  Physical Activity: Sufficiently Active (03/13/2023)   Exercise Vital Sign    Days of Exercise per Week: 7 days    Minutes of Exercise per Session: 90 min  Stress: No Stress Concern Present (03/13/2023)   [employer] of Occupational Health - Occupational Stress Questionnaire    Feeling of Stress : Not at all  Social Connections: Moderately Integrated (03/13/2023)   Social Connection and Isolation Panel [NHANES]    Frequency of Communication with Friends and Family: More than three times a week    Frequency of Social Gatherings with Friends and Family: More than three times a week    Attends Religious Services: More than 4 times per year    Active Member of [employer] or Organizations: No    Attends Banker Meetings: Never    Marital Status: Married  Recent Concern: Social Connections - Moderately Isolated (01/05/2023)   Social Connection and Isolation Panel [NHANES]    Frequency  of Communication with Friends and Family: Once a week    Frequency of Social Gatherings with Friends and Family: Once a week    Attends Religious Services: More than 4 times per year    Active Member of [employer] or Organizations: No    Attends Engineer, structural: Not on file    Marital Status: Married    Tobacco Counseling Counseling given: Not Answered   Clinical Intake:  Pre-visit preparation completed: Yes  Pain : No/denies pain     BMI - recorded: 25.62 Nutritional Risks: None Diabetes: No  How often do you need to have someone help you when you read instructions, pamphlets, or other written materials from your doctor or pharmacy?: 1 - Never  Interpreter Needed?: No  Information entered by :: C.Melisha Eggleton LPN   Activities of Daily Living    03/13/2023    2:30 PM 05/05/2022    8:50 AM  In your present state of health, do you have any difficulty performing the following activities:  Hearing? 1 0  Comment Hearing aids has hearing aids  Vision? 0 0  Difficulty concentrating or making decisions? 0 0  Walking or climbing stairs? 0 0  Dressing or bathing? 0 0  Doing errands, shopping? 0 0  Preparing Food and eating ? N N  Using the Toilet? N N  In the past six months, have you accidently leaked urine? N N  Do you have problems with loss of bowel control? N N  Managing your Medications? N N  Managing your Finances? N N  Housekeeping or managing your Housekeeping? N N    Patient Care Team: Eustaquio Boyden, MD as PCP - General (Family Medicine) Debbe Odea, MD as PCP - Cardiology (Cardiology) Irene Limbo., MD as Referring Physician (Ophthalmology) Smitty Cords Michaell Cowing., MD as Referring Physician (Dentistry) Willeen Niece,  MD as Referring Physician (Dermatology) Elinor Parkinson, [place] as Consulting Physician (Podiatry) Debbe Odea, MD as Consulting Physician (Cardiology)  Indicate any recent Medical Services you may have received from other  than [facility] providers in the past year (date may be approximate).     Assessment:   This is a routine wellness examination for [employer].  Hearing/Vision screen Hearing Screening - Comments:: Bilateral hearing aids Vision Screening - Comments:: Glasses - UTD on exams with Dr.Bell   Goals Addressed             This Visit's Progress    Patient Stated       Lose 15-20 pounds       Depression Screen    03/13/2023    2:24 PM 01/08/2023    8:27 AM 05/05/2022    8:49 AM 05/04/2021    9:58 AM 03/16/2020    1:24 PM 01/27/2019    8:31 AM 01/07/2018   10:56 AM  PHQ 2/9 Scores  PHQ - 2 Score 0 0 0 0 0 0 0  PHQ- 9 Score  2   0  0    Fall Risk    03/13/2023    2:27 PM 01/08/2023    8:27 AM 05/05/2022    8:49 AM 05/04/2021    9:54 AM 03/16/2020    1:23 PM  Fall Risk   Falls in the past year? 0 0 0 1 0  Number falls in past yr: 0  0 0 0  Injury with Fall? 0  0 0 0  Risk for fall due to : No Fall Risks  Medication side effect No Fall Risks Medication side effect  Follow up Falls prevention discussed;Falls evaluation completed  Falls prevention discussed;Education provided;Falls evaluation completed Falls prevention discussed Falls evaluation completed;Falls prevention discussed    MEDICARE RISK AT HOME: Medicare Risk at Home Any stairs in or around the home?: Yes If so, are there any without handrails?: No Home free of loose throw rugs in walkways, pet beds, electrical cords, etc?: Yes Adequate lighting in your home to reduce risk of falls?: Yes Life alert?: No Use of a cane, walker or w/c?: No Grab bars in the bathroom?: Yes Shower chair or bench in shower?: No Elevated toilet seat or a handicapped toilet?: Yes  TIMED UP AND GO:  Was the test performed?  No    Cognitive Function:    03/16/2020    1:27 PM 01/07/2018   10:57 AM 12/16/2015    9:34 AM  MMSE - Mini Mental State Exam  Orientation to time 5 5 5   Orientation to Place 5 5 5   Registration 3 3 3   Attention/ Calculation 5  0 0  Recall 3 3 3   Language- name 2 objects  0 0  Language- repeat 1 1 1   Language- follow 3 step command  3 3  Language- read & follow direction  0 0  Write a sentence  0 0  Copy design  0 0  Total score  20 20        03/13/2023    2:31 PM 05/05/2022    8:51 AM  6CIT Screen  What Year? 0 points 0 points  What month? 0 points 0 points  What time? 0 points 0 points  Count back from 20 0 points 0 points  Months in reverse 0 points 0 points  Repeat phrase 0 points 2 points  Total Score 0 points 2 points    Immunizations Immunization History  Administered  Date(s) Administered   Fluad Quad(high Dose 65+) 03/31/2019, 03/19/2020, 05/11/2021, 03/23/2022   Influenza,inj,Quad PF,6+ Mos 08/02/2016, 08/26/2018   PFIZER(Purple Top)SARS-COV-2 Vaccination 10/02/2019, 10/23/2019   Pneumococcal Conjugate-13 12/03/2013   Pneumococcal Polysaccharide-23 11/11/2012   Tdap 12/27/2011   Zoster, Live 07/04/2011    TDAP status: Due, Education has been provided regarding the importance of this vaccine. Advised may receive this vaccine at local pharmacy or Health Dept. Aware to provide a copy of the vaccination record if obtained from local pharmacy or Health Dept. Verbalized acceptance and understanding.  Flu Vaccine status: Due, Education has been provided regarding the importance of this vaccine. Advised may receive this vaccine at local pharmacy or Health Dept. Aware to provide a copy of the vaccination record if obtained from local pharmacy or Health Dept. Verbalized acceptance and understanding. Pt stated he will get vaccine in October.  Pneumococcal vaccine status: Up to date  Covid-19 vaccine status: Declined, Education has been provided regarding the importance of this vaccine but patient still declined. Advised may receive this vaccine at local pharmacy or Health Dept.or vaccine clinic. Aware to provide a copy of the vaccination record if obtained from local pharmacy or Health Dept.  Verbalized acceptance and understanding.  Qualifies for Shingles Vaccine? Yes   Zostavax completed Yes   Shingrix Completed?: No.    Education has been provided regarding the importance of this vaccine. Patient has been advised to call insurance company to determine out of pocket expense if they have not yet received this vaccine. Advised may also receive vaccine at local pharmacy or Health Dept. Verbalized acceptance and understanding.  Screening Tests Health Maintenance  Topic Date Due   DTaP/Tdap/Td (2 - Td or Tdap) 12/26/2021   COVID-19 Vaccine (3 - Pfizer risk series) 07/03/2023 (Originally 11/20/2019)   Zoster Vaccines- Shingrix (1 of 2) 07/03/2023 (Originally 08/01/1964)   INFLUENZA VACCINE  10/01/2023 (Originally 02/01/2023)   Fecal DNA (Cologuard)  04/06/2023   Medicare Annual Wellness (AWV)  03/12/2024   Pneumonia Vaccine 87+ Years old  Completed   Hepatitis C Screening  Completed   HPV VACCINES  Aged Out    Health Maintenance  Health Maintenance Due  Topic Date Due   DTaP/Tdap/Td (2 - Td or Tdap) 12/26/2021    Colorectal cancer screening: No longer required. Pt will discuss with PCP.  Lung Cancer Screening: (Low Dose CT Chest recommended if Age 2-80 years, 20 pack-year currently smoking OR have quit w/in 15years.) does not qualify.   Lung Cancer Screening Referral: no  Additional Screening:  Hepatitis C Screening: does qualify; Completed 01/15/16  Vision Screening: Recommended annual ophthalmology exams for early detection of glaucoma and other disorders of the eye. Is the patient up to date with their annual eye exam?  Yes  Who is the provider or what is the name of the office in which the patient attends annual eye exams? Dr.Bell If pt is not established with a provider, would they like to be referred to a provider to establish care? Yes .   Dental Screening: Recommended annual dental exams for proper oral hygiene  Diabetic Foot Exam:   Community Resource  Referral / Chronic Care Management: CRR required this visit?  No   CCM required this visit?  No     Plan:     I have personally reviewed and noted the following in the patient's chart:   Medical and social history Use of alcohol, tobacco or illicit drugs  Current medications and supplements including opioid prescriptions. Patient is  not currently taking opioid prescriptions. Functional ability and status Nutritional status Physical activity Advanced directives List of other physicians Hospitalizations, surgeries, and ER visits in previous 12 months Vitals Screenings to include cognitive, depression, and falls Referrals and appointments  In addition, I have reviewed and discussed with patient certain preventive protocols, quality metrics, and best practice recommendations. A written personalized care plan for preventive services as well as general preventive health recommendations were provided to patient.     Maryan Puls, LPN   1/61/0960   After Visit Summary: (MyChart) Due to this being a telephonic visit, the after visit summary with patients personalized plan was offered to patient via MyChart   Nurse Notes: none

## 2023-03-13 NOTE — Patient Instructions (Signed)
Mr. Postlewait , Thank you for taking time to come for your Medicare Wellness Visit. I appreciate your ongoing commitment to your health goals. Please review the following plan we discussed and let me know if I can assist you in the future.   Referrals/Orders/Follow-Ups/Clinician Recommendations: Aim for 30 minutes of exercise or brisk walking, 6-8 glasses of water, and 5 servings of fruits and vegetables each day.   This is a list of the screening recommended for you and due dates:  Health Maintenance  Topic Date Due   Zoster (Shingles) Vaccine (1 of 2) 08/01/1964   COVID-19 Vaccine (3 - Pfizer risk series) 11/20/2019   DTaP/Tdap/Td vaccine (2 - Td or Tdap) 12/26/2021   Flu Shot  02/01/2023   Medicare Annual Wellness Visit  05/06/2023   Cologuard (Stool DNA test)  04/06/2023   Pneumonia Vaccine  Completed   Hepatitis C Screening  Completed   HPV Vaccine  Aged Out    Advanced directives: (In Chart) A copy of your advanced directives are scanned into your chart should your provider ever need it.  Next Medicare Annual Wellness Visit scheduled for next year: Yes

## 2023-05-03 ENCOUNTER — Other Ambulatory Visit: Payer: Self-pay | Admitting: Internal Medicine

## 2023-05-03 DIAGNOSIS — R058 Other specified cough: Secondary | ICD-10-CM

## 2023-05-10 ENCOUNTER — Other Ambulatory Visit: Payer: Self-pay | Admitting: *Deleted

## 2023-05-10 DIAGNOSIS — C61 Malignant neoplasm of prostate: Secondary | ICD-10-CM

## 2023-05-15 ENCOUNTER — Ambulatory Visit: Payer: PPO | Admitting: Dermatology

## 2023-05-15 ENCOUNTER — Encounter: Payer: Self-pay | Admitting: Dermatology

## 2023-05-15 DIAGNOSIS — D492 Neoplasm of unspecified behavior of bone, soft tissue, and skin: Secondary | ICD-10-CM

## 2023-05-15 DIAGNOSIS — D485 Neoplasm of uncertain behavior of skin: Secondary | ICD-10-CM

## 2023-05-15 MED ORDER — DOXYCYCLINE MONOHYDRATE 100 MG PO CAPS: 100.0000 mg | ORAL_CAPSULE | Freq: Two times a day (BID) | ORAL | 0 refills | Status: AC

## 2023-05-15 NOTE — Patient Instructions (Signed)
Start doxycycline 100 mg twice daily with food x 2 weeks.  Recommend 1 tbsp Vinegar mixed with 1 cup of warm water soaks 3x daily  Doxycycline should be taken with food to prevent nausea. Do not lay down for 30 minutes after taking. Be cautious with sun exposure and use good sun protection while on this medication. Pregnant women should not take this medication.   Due to recent changes in healthcare laws, you may see results of your pathology and/or laboratory studies on MyChart before the doctors have had a chance to review them. We understand that in some cases there may be results that are confusing or concerning to you. Please understand that not all results are received at the same time and often the doctors may need to interpret multiple results in order to provide you with the best plan of care or course of treatment. Therefore, we ask that you please give [place] 2 business days to thoroughly review all your results before contacting the office for clarification. Should we see a critical lab result, you will be contacted sooner.   If You Need Anything After Your Visit  If you have any questions or concerns for your doctor, please call our main line at [phone] and press option 4 to reach your doctor's medical assistant. If no one answers, please leave a voicemail as directed and we will return your call as soon as possible. Messages left after 4 pm will be answered the following business day.   You may also send [place] a message via MyChart. We typically respond to MyChart messages within 1-2 business days.  For prescription refills, please ask your pharmacy to contact our office. Our fax number is [phone].  If you have an urgent issue when the clinic is closed that cannot wait until the next business day, you can page your doctor at the number below.    Please note that while we do our best to be available for urgent issues outside of office hours, we are not available 24/7.   If you  have an urgent issue and are unable to reach [place], you may choose to seek medical care at your doctor's office, retail clinic, urgent care center, or emergency room.  If you have a medical emergency, please immediately call 911 or go to the emergency department.  Pager Numbers  - Dr. Gwen Pounds: [phone]  - Dr. Roseanne Reno: [phone]  - Dr. Katrinka Blazing: [phone]   In the event of inclement weather, please call our main line at [phone] for an update on the status of any delays or closures.  Dermatology Medication Tips: Please keep the boxes that topical medications come in in order to help keep track of the instructions about where and how to use these. Pharmacies typically print the medication instructions only on the boxes and not directly on the medication tubes.   If your medication is too expensive, please contact our office at [phone] option 4 or send [place] a message through MyChart.   We are unable to tell what your co-pay for medications will be in advance as this is different depending on your insurance coverage. However, we may be able to find a substitute medication at lower cost or fill out paperwork to get insurance to cover a needed medication.   If a prior authorization is required to get your medication covered by your insurance company, please allow [place] 1-2 business days to complete this process.  Drug prices often vary depending on where the prescription is  filled and some pharmacies may offer cheaper prices.  The website www.goodrx.com contains coupons for medications through different pharmacies. The prices here do not account for what the cost may be with help from insurance (it may be cheaper with your insurance), but the website can give you the price if you did not use any insurance.  - You can print the associated coupon and take it with your prescription to the pharmacy.  - You may also stop by our office during regular business hours and pick up a GoodRx coupon  card.  - If you need your prescription sent electronically to a different pharmacy, notify our office through [facility] or by phone at [phone] option 4.

## 2023-05-15 NOTE — Progress Notes (Signed)
   Follow-Up Visit   Subjective  Aaron Brown. is a 77 y.o. male who presents for the following: spot at right 1st finger, present for about 2 weeks. Has been sore, patient using bacitracin.   The patient has spots, moles and lesions to be evaluated, some may be new or changing and the patient may have concern these could be cancer.   The following portions of the chart were reviewed this encounter and updated as appropriate: medications, allergies, medical history  Review of Systems:  No other skin or systemic complaints except as noted in HPI or Assessment and Plan.  Objective  Well appearing patient in no apparent distress; mood and affect are within normal limits.   A focused examination was performed of the following areas: hands and fingers  Relevant exam findings are noted in the Assessment and Plan.  right 1st finger       Assessment & Plan     Neoplasm of uncertain behavior of skin right 1st finger  Inflamed digital Mucous Cyst vs bacterial paronychia  Start doxycycline 100 mg twice daily with food x 2 weeks.  Recommend 1 tbsp Vinegar mixed with 1 cup of warm water soaks for 10 minutes 3x daily  Follow up 12/3 with Dr Roseanne Reno or sooner if not resolved/worsening    Return for UBSE, with Dr. Roseanne Reno, as scheduled, Hx SCC, Hx BCC, Hx AK, AK follow up.  Anise Salvo, RMA, am acting as scribe for Elie Goody, MD .   Documentation: I have reviewed the above documentation for accuracy and completeness, and I agree with the above.  Elie Goody, MD

## 2023-05-16 ENCOUNTER — Other Ambulatory Visit: Payer: PPO

## 2023-05-16 DIAGNOSIS — C61 Malignant neoplasm of prostate: Secondary | ICD-10-CM | POA: Diagnosis not present

## 2023-05-17 LAB — PSA: Prostate Specific Ag, Serum: 4.7 ng/mL — ABNORMAL HIGH (ref 0.0–4.0)

## 2023-05-18 ENCOUNTER — Encounter: Payer: Self-pay | Admitting: Urology

## 2023-05-18 ENCOUNTER — Ambulatory Visit: Payer: PPO | Admitting: Urology

## 2023-05-18 VITALS — BP 150/72 | HR 73 | Ht 68.0 in | Wt 169.0 lb

## 2023-05-18 DIAGNOSIS — C61 Malignant neoplasm of prostate: Secondary | ICD-10-CM | POA: Diagnosis not present

## 2023-05-18 NOTE — Progress Notes (Signed)
I, Aaron Brown, acting as a scribe for Riki Altes, MD., have documented all relevant documentation on the behalf of Riki Altes, MD, as directed by Riki Altes, MD while in the presence of Riki Altes, MD.  05/18/2023 12:23 PM   Aaron Brown 1945-10-28 [id number]  Referring provider: Eustaquio Boyden, MD [address] [place],  [place] [address]  Chief Complaint  Patient presents with   Prostate Cancer   Urologic history: 1.  Prostate cancer T1c low risk Fusion biopsy 08/2020 PI-RADS 4/PI-RADS 3 lesions; 46 g gland PSA 5.1 ROI lesions benign Gleason 3+3 adenocarcinoma RLM, RA, RLA (5%, 20%, 5%) Confirmatory biopsy perform 11/02/2022; 53-gram prostate; path 12/12 cores showed no prostate cancer.   [name] is a 77 y.o. male presents for a 6 month follow-up.  Doing well since last visit No bothersome LUTS Denies dysuria, gross hematuria Denies flank, abdominal or pelvic pain PSA 05/16/2023 stable at 4.7   PSA trend   Prostate Specific Ag, Serum  Latest Ref Rng 0.0 - 4.0 ng/mL  06/16/2020 5.1 (H)   03/10/2021 4.8 (H)   09/13/2021 4.4 (H)   03/23/2022 4.4 (H)   05/16/2023 4.7 (H)     PMH: Past Medical History:  Diagnosis Date   Actinic keratosis    Asymmetrical left sensorineural hearing loss 12/2013   s/p MRI pending hearing aides Willeen Cass)   Basal cell carcinoma 10/21/2019   L ala crease   Basal cell carcinoma 11/11/2018   R chin/excision   Basal cell carcinoma 03/12/2018   L mid forearm   Basal cell carcinoma 05/30/2016   R inf clavicle, L lateral canthus   Basal cell carcinoma 01/06/2014   L elbow   Basal cell carcinoma 11/02/2020   L mid forearm, recurrent. Exc 12/27/2020.   Basal cell carcinoma 05/02/2022   R nasal ala - Mohs 07/21/2022   Basal cell carcinoma 12/05/2022   Posterior neck, EDC   Blood in stool    in the past; ? hemorrhoids   Cerebral microvascular disease 12/2013   by MRI, referred  to neuro by ENT   Colon polyp 05/2005   ?hyperplastic   History of chicken pox    Hypertension    Increased pressure in the eye    Dr. Coralee Rud heart valve    Lower back pain    bulging disc with spinal stenosis L4/5 s/p surgery x2   Prediabetes    A1c 6.0 (2012)   Seasonal allergies    Squamous acanthoma of face    Squamous cell cancer of skin of hand 10/22/2018   R hand dorsum below first webspace   Squamous cell carcinoma of neck 06/26/2013   Right anterior neck/excision   Squamous cell carcinoma of skin 11/17/2019   Left dorsal hand. SCCis hypertrophic.    Surgical History: Past Surgical History:  Procedure Laterality Date   BUNIONECTOMY  2003   Dr. Elvin So   COLONOSCOPY  05/2005   1 polyp, int hem    DOBUTAMINE STRESS ECHO  2013   normal per prior PCP records   hemi-laminectomy  2000   Dr. Jordan Likes, Dr. Gerlene Fee L4/L5   LAMINECTOMY  1988   Dr. Hope Pigeon; L5   SQUAMOUS CELL CARCINOMA EXCISION  2014   face and hand    Home Medications:  Allergies as of 05/18/2023       Reactions   Hydrochlorothiazide Other (See Comments)   Mild drop  in sodium   Lisinopril    May have caused lip swelling        Medication List        Accurate as of May 18, 2023 12:23 PM. If you have any questions, ask your nurse or doctor.          amLODipine 5 MG tablet Commonly known as: NORVASC Take 1 tablet (5 mg total) by mouth daily.   aspirin EC 81 MG tablet Take 1 tablet (81 mg total) by mouth daily. Swallow whole.   beta carotene w/minerals tablet Take 1 tablet by mouth daily.   calcipotriene 0.005 % cream Commonly known as: DOVONOX Apply to the nose BID x 4 days.   cholecalciferol 1000 units tablet Commonly known as: VITAMIN D Take 1,000 Units by mouth daily.   cyclobenzaprine 10 MG tablet Commonly known as: FLEXERIL Take 1 tablet (10 mg total) by mouth 2 (two) times daily as needed for muscle spasms.   doxycycline 100 MG capsule Commonly known as:  MONODOX Take 1 capsule (100 mg total) by mouth 2 (two) times daily for 14 days. Take with food   etodolac 400 MG tablet Commonly known as: LODINE Take 1 tablet (400 mg total) by mouth 2 (two) times daily as needed.   famotidine 20 MG tablet Commonly known as: PEPCID TAKE 1 TABLET BY MOUTH DAILY AFTER SUPPER   fluorouracil 5 % cream Commonly known as: EFUDEX Apply to nose BID x 4 days.   tadalafil 20 MG tablet Commonly known as: CIALIS TAKE ONE TABLET ONE HOUR PRIOR TO INTERCOURSE   valsartan 160 MG tablet Commonly known as: Diovan Take 1 tablet (160 mg total) by mouth daily.        Allergies:  Allergies  Allergen Reactions   Hydrochlorothiazide Other (See Comments)    Mild drop in sodium   Lisinopril     May have caused lip swelling    Family History: Family History  Problem Relation Age of Onset   Hypertension Mother    Stroke Mother    Arthritis Father    Hypertension Father    Cancer Maternal Aunt        colon   Alcohol abuse Maternal Grandfather    Cancer Cousin        colon   Cancer Cousin        breast    Social History:  reports that he has never smoked. He has never used smokeless tobacco. He reports current alcohol use. He reports that he does not use drugs.   Physical Exam: BP (!) 150/72   Pulse 73   Ht 5\' 8"  (1.727 m)   Wt 169 lb (76.7 kg)   BMI 25.70 kg/m   Constitutional:  Alert and oriented, No acute distress. HEENT: [place] AT Respiratory: Normal respiratory effort, no increased work of breathing. Psychiatric: Normal mood and affect.   Assessment & Plan:    1.  T1c low risk prostate cancer Stable PSA Recent confirmatory biopsy was negative. 6 month follow-up visit with PSA/DRE.  If PSA is stable in 6 months, will go to annual visit with PSA/DRE and an interim 6 month PSA lab visit.   I have reviewed the above documentation for accuracy and completeness, and I agree with the above.   Riki Altes, MD  [facility] [address], [address] [place], [place] [address] [phone]

## 2023-05-27 ENCOUNTER — Emergency Department
Admission: EM | Admit: 2023-05-27 | Discharge: 2023-05-27 | Disposition: A | Discharge status: Home / Self Care | Payer: PPO | Attending: Emergency Medicine | Admitting: Emergency Medicine

## 2023-05-27 ENCOUNTER — Emergency Department: Payer: PPO

## 2023-05-27 ENCOUNTER — Other Ambulatory Visit: Payer: Self-pay

## 2023-05-27 ENCOUNTER — Encounter: Payer: Self-pay | Admitting: Emergency Medicine

## 2023-05-27 DIAGNOSIS — S8012XA Contusion of left lower leg, initial encounter: Secondary | ICD-10-CM | POA: Diagnosis not present

## 2023-05-27 DIAGNOSIS — M79605 Pain in left leg: Secondary | ICD-10-CM | POA: Diagnosis not present

## 2023-05-27 DIAGNOSIS — W228XXA Striking against or struck by other objects, initial encounter: Secondary | ICD-10-CM | POA: Insufficient documentation

## 2023-05-27 DIAGNOSIS — M7989 Other specified soft tissue disorders: Secondary | ICD-10-CM | POA: Diagnosis not present

## 2023-05-27 DIAGNOSIS — S8992XA Unspecified injury of left lower leg, initial encounter: Secondary | ICD-10-CM | POA: Diagnosis present

## 2023-05-27 LAB — COMPREHENSIVE METABOLIC PANEL
ALT: 23 U/L (ref 0–44)
AST: 34 U/L (ref 15–41)
Albumin: 4.1 g/dL (ref 3.5–5.0)
Alkaline Phosphatase: 54 U/L (ref 38–126)
Anion gap: 10 (ref 5–15)
BUN: 18 mg/dL (ref 8–23)
CO2: 23 mmol/L (ref 22–32)
Calcium: 9.2 mg/dL (ref 8.9–10.3)
Chloride: 100 mmol/L (ref 98–111)
Creatinine, Ser: 1.4 mg/dL — ABNORMAL HIGH (ref 0.61–1.24)
GFR, Estimated: 52 mL/min — ABNORMAL LOW (ref >60)
Glucose, Bld: 124 mg/dL — ABNORMAL HIGH (ref 70–99)
Potassium: 4.5 mmol/L (ref 3.5–5.1)
Sodium: 133 mmol/L — ABNORMAL LOW (ref 135–145)
Total Bilirubin: 0.7 mg/dL (ref <1.2)
Total Protein: 7.6 g/dL (ref 6.5–8.1)

## 2023-05-27 LAB — CBC
HCT: 41.3 % (ref 39.0–52.0)
Hemoglobin: 14.5 g/dL (ref 13.0–17.0)
MCH: 32.2 pg (ref 26.0–34.0)
MCHC: 35.1 g/dL (ref 30.0–36.0)
MCV: 91.8 fL (ref 80.0–100.0)
Platelets: 168 10*3/uL (ref 150–400)
RBC: 4.5 MIL/uL (ref 4.22–5.81)
RDW: 12.4 % (ref 11.5–15.5)
WBC: 6.6 10*3/uL (ref 4.0–10.5)
nRBC: 0 % (ref 0.0–0.2)

## 2023-05-27 NOTE — Discharge Instructions (Addendum)
Follow-up with your primary care provider if any continued problems or concerns.  Warm moist heat and elevation to 3 times a day for approximately 30 to 40 minutes.  Activity as tolerated.  Continue with your regular medication.  Return to the emergency department if any severe worsening of your symptoms.
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
## 2023-06-05 ENCOUNTER — Ambulatory Visit: Payer: PPO | Admitting: Dermatology

## 2023-06-05 DIAGNOSIS — L570 Actinic keratosis: Secondary | ICD-10-CM | POA: Diagnosis not present

## 2023-06-05 DIAGNOSIS — L814 Other melanin hyperpigmentation: Secondary | ICD-10-CM | POA: Diagnosis not present

## 2023-06-05 DIAGNOSIS — W908XXA Exposure to other nonionizing radiation, initial encounter: Secondary | ICD-10-CM

## 2023-06-05 DIAGNOSIS — L821 Other seborrheic keratosis: Secondary | ICD-10-CM

## 2023-06-05 DIAGNOSIS — Z5111 Encounter for antineoplastic chemotherapy: Secondary | ICD-10-CM | POA: Diagnosis not present

## 2023-06-05 DIAGNOSIS — D229 Melanocytic nevi, unspecified: Secondary | ICD-10-CM

## 2023-06-05 DIAGNOSIS — M67441 Ganglion, right hand: Secondary | ICD-10-CM | POA: Diagnosis not present

## 2023-06-05 DIAGNOSIS — L578 Other skin changes due to chronic exposure to nonionizing radiation: Secondary | ICD-10-CM

## 2023-06-05 DIAGNOSIS — D1801 Hemangioma of skin and subcutaneous tissue: Secondary | ICD-10-CM | POA: Diagnosis not present

## 2023-06-05 DIAGNOSIS — T148XXA Other injury of unspecified body region, initial encounter: Secondary | ICD-10-CM

## 2023-06-05 DIAGNOSIS — Z7189 Other specified counseling: Secondary | ICD-10-CM

## 2023-06-05 DIAGNOSIS — S8012XA Contusion of left lower leg, initial encounter: Secondary | ICD-10-CM | POA: Diagnosis not present

## 2023-06-05 DIAGNOSIS — Z85828 Personal history of other malignant neoplasm of skin: Secondary | ICD-10-CM | POA: Diagnosis not present

## 2023-06-05 NOTE — Patient Instructions (Addendum)
Cryotherapy Aftercare  Wash gently with soap and water everyday.   Apply Vaseline and Band-Aid daily until healed.    - Start 5-fluorouracil/calcipotriene cream twice a day for 7 days to affected areas including hands and scalp. Patient provided with handout reviewing treatment course and side effects and advised to call or message [place] on MyChart with any concerns.  Reviewed course of treatment and expected reaction.  Patient advised to expect inflammation and crusting and advised that erosions are possible.  Patient advised to be diligent with sun protection during and after treatment. Counseled to keep medication out of reach of children and pets.   Due to recent changes in healthcare laws, you may see results of your pathology and/or laboratory studies on MyChart before the doctors have had a chance to review them. We understand that in some cases there may be results that are confusing or concerning to you. Please understand that not all results are received at the same time and often the doctors may need to interpret multiple results in order to provide you with the best plan of care or course of treatment. Therefore, we ask that you please give [place] 2 business days to thoroughly review all your results before contacting the office for clarification. Should we see a critical lab result, you will be contacted sooner.   If You Need Anything After Your Visit  If you have any questions or concerns for your doctor, please call our main line at [phone] and press option 4 to reach your doctor's medical assistant. If no one answers, please leave a voicemail as directed and we will return your call as soon as possible. Messages left after 4 pm will be answered the following business day.   You may also send [place] a message via MyChart. We typically respond to MyChart messages within 1-2 business days.  For prescription refills, please ask your pharmacy to contact our office. Our fax number is  [phone].  If you have an urgent issue when the clinic is closed that cannot wait until the next business day, you can page your doctor at the number below.    Please note that while we do our best to be available for urgent issues outside of office hours, we are not available 24/7.   If you have an urgent issue and are unable to reach [place], you may choose to seek medical care at your doctor's office, retail clinic, urgent care center, or emergency room.  If you have a medical emergency, please immediately call 911 or go to the emergency department.  Pager Numbers  - Dr. Gwen Pounds: [phone]  - Dr. Roseanne Reno: [phone]  - Dr. Katrinka Blazing: [phone]   In the event of inclement weather, please call our main line at [phone] for an update on the status of any delays or closures.  Dermatology Medication Tips: Please keep the boxes that topical medications come in in order to help keep track of the instructions about where and how to use these. Pharmacies typically print the medication instructions only on the boxes and not directly on the medication tubes.   If your medication is too expensive, please contact our office at [phone] option 4 or send [place] a message through MyChart.   We are unable to tell what your co-pay for medications will be in advance as this is different depending on your insurance coverage. However, we may be able to find a substitute medication at lower cost or fill out paperwork to get insurance to cover  a needed medication.   If a prior authorization is required to get your medication covered by your insurance company, please allow [place] 1-2 business days to complete this process.  Drug prices often vary depending on where the prescription is filled and some pharmacies may offer cheaper prices.  The website www.goodrx.com contains coupons for medications through different pharmacies. The prices here do not account for what the cost may be with help from  insurance (it may be cheaper with your insurance), but the website can give you the price if you did not use any insurance.  - You can print the associated coupon and take it with your prescription to the pharmacy.  - You may also stop by our office during regular business hours and pick up a GoodRx coupon card.  - If you need your prescription sent electronically to a different pharmacy, notify our office through [facility] or by phone at [phone] option 4.     Si Usted Necesita Algo Despus de Su Visita  Tambin puede enviarnos un mensaje a travs de Clinical cytogeneticist. Por lo general respondemos a los mensajes de MyChart en el transcurso de 1 a 2 das hbiles.  Para renovar recetas, por favor pida a su farmacia que se ponga en contacto con nuestra oficina. Annie Sable de fax es [place] [phone].  Si tiene un asunto urgente cuando la clnica est cerrada y que no puede esperar hasta el siguiente da hbil, puede llamar/localizar a su doctor(a) al nmero que aparece a continuacin.   Por favor, tenga en cuenta que aunque hacemos todo lo posible para estar disponibles para asuntos urgentes fuera del horario de [place], no estamos disponibles las 24 horas del da, los 7 [address]  [address] de la [place].   Si tiene un problema urgente y no puede comunicarse con nosotros, puede optar por buscar atencin mdica  en el consultorio de su doctor(a), en una clnica privada, en un centro de atencin urgente o en una sala de emergencias.  Si tiene Engineer, drilling, por favor llame inmediatamente al 911 o vaya a la sala de emergencias.  Nmeros de bper  - Dr. Gwen Pounds: [phone]  - Dra. Roseanne Reno: [phone]  - Dr. Katrinka Blazing: [phone]   En caso de inclemencias del tiempo, por favor llame a Lacy Duverney principal al [phone] para una actualizacin sobre el [address] [place] de cualquier retraso o cierre.  Consejos para la medicacin en dermatologa: Por favor, guarde las cajas en las que vienen los  medicamentos de uso tpico para ayudarle a seguir las instrucciones sobre dnde y cmo usarlos. Las farmacias generalmente imprimen las instrucciones del medicamento slo en las cajas y no directamente en los tubos del [place].   Si su medicamento es muy caro, por favor, pngase en contacto con Rolm Gala llamando al [phone] y presione la opcin 4 o envenos un mensaje a travs de Clinical cytogeneticist.   No podemos decirle cul ser su copago por los medicamentos por adelantado ya que esto es diferente dependiendo de la cobertura de su seguro. Sin embargo, es posible que podamos encontrar un medicamento sustituto a Audiological scientist un formulario para que el seguro cubra el medicamento que se considera necesario.   Si se requiere una autorizacin previa para que su compaa de seguros [place] su medicamento, por favor permtanos de 1 a 2 das hbiles para completar [address]  Los precios de los medicamentos varan con frecuencia dependiendo del Environmental consultant de dnde se surte la receta y alguna farmacias pueden ofrecer precios ms  baratos.  El sitio web www.goodrx.com tiene cupones para medicamentos de Health and safety inspector. Los precios aqu no tienen en cuenta lo que podra costar con la ayuda del seguro (puede ser ms barato con su seguro), pero el sitio web puede darle el precio si no utiliz Tourist information centre manager.  - Puede imprimir el cupn correspondiente y llevarlo con su receta a la farmacia.  - Tambin puede pasar por nuestra oficina durante el horario de atencin regular y Education officer, museum una tarjeta de cupones de GoodRx.  - Si necesita que su receta se enve electrnicamente a una farmacia diferente, informe a nuestra oficina a travs de MyChart de [place] o por telfono llamando al [phone] y presione la opcin 4.
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
## 2023-06-30 ENCOUNTER — Other Ambulatory Visit: Payer: Self-pay | Admitting: Family Medicine

## 2023-06-30 DIAGNOSIS — R7303 Prediabetes: Secondary | ICD-10-CM

## 2023-07-06 ENCOUNTER — Other Ambulatory Visit (INDEPENDENT_AMBULATORY_CARE_PROVIDER_SITE_OTHER): Payer: PPO

## 2023-07-06 DIAGNOSIS — R7303 Prediabetes: Secondary | ICD-10-CM | POA: Diagnosis not present

## 2023-07-06 LAB — COMPREHENSIVE METABOLIC PANEL
ALT: 20 U/L (ref 0–53)
AST: 33 U/L (ref 0–37)
Albumin: 4.5 g/dL (ref 3.5–5.2)
Alkaline Phosphatase: 62 U/L (ref 39–117)
BUN: 17 mg/dL (ref 6–23)
CO2: 23 meq/L (ref 19–32)
Calcium: 9.3 mg/dL (ref 8.4–10.5)
Chloride: 101 meq/L (ref 96–112)
Creatinine, Ser: 1.14 mg/dL (ref 0.40–1.50)
GFR: 62.73 mL/min (ref >60.00)
Glucose, Bld: 100 mg/dL — ABNORMAL HIGH (ref 70–99)
Potassium: 4.4 meq/L (ref 3.5–5.1)
Sodium: 135 meq/L (ref 135–145)
Total Bilirubin: 0.6 mg/dL (ref 0.2–1.2)
Total Protein: 7.8 g/dL (ref 6.0–8.3)

## 2023-07-06 LAB — LIPID PANEL
Cholesterol: 161 mg/dL (ref 0–200)
HDL: 64.7 mg/dL (ref >39.00)
LDL Cholesterol: 87 mg/dL (ref 0–99)
NonHDL: 95.95
Total CHOL/HDL Ratio: 2
Triglycerides: 43 mg/dL (ref 0.0–149.0)
VLDL: 8.6 mg/dL (ref 0.0–40.0)

## 2023-07-06 LAB — HEMOGLOBIN A1C: Hgb A1c MFr Bld: 6.2 % (ref 4.6–6.5)

## 2023-07-13 ENCOUNTER — Encounter: Payer: Self-pay | Admitting: Family Medicine

## 2023-07-13 ENCOUNTER — Ambulatory Visit (INDEPENDENT_AMBULATORY_CARE_PROVIDER_SITE_OTHER): Payer: PPO | Admitting: Family Medicine

## 2023-07-13 VITALS — BP 136/82 | HR 79 | Temp 97.7°F | Ht 66.75 in | Wt 174.2 lb

## 2023-07-13 DIAGNOSIS — I6789 Other cerebrovascular disease: Secondary | ICD-10-CM

## 2023-07-13 DIAGNOSIS — I10 Essential (primary) hypertension: Secondary | ICD-10-CM | POA: Diagnosis not present

## 2023-07-13 DIAGNOSIS — C61 Malignant neoplasm of prostate: Secondary | ICD-10-CM | POA: Diagnosis not present

## 2023-07-13 DIAGNOSIS — Z1211 Encounter for screening for malignant neoplasm of colon: Secondary | ICD-10-CM

## 2023-07-13 DIAGNOSIS — R7303 Prediabetes: Secondary | ICD-10-CM | POA: Diagnosis not present

## 2023-07-13 DIAGNOSIS — H409 Unspecified glaucoma: Secondary | ICD-10-CM

## 2023-07-13 DIAGNOSIS — Z0000 Encounter for general adult medical examination without abnormal findings: Secondary | ICD-10-CM

## 2023-07-13 DIAGNOSIS — I340 Nonrheumatic mitral (valve) insufficiency: Secondary | ICD-10-CM

## 2023-07-13 DIAGNOSIS — Z7189 Other specified counseling: Secondary | ICD-10-CM | POA: Diagnosis not present

## 2023-07-13 DIAGNOSIS — I2510 Atherosclerotic heart disease of native coronary artery without angina pectoris: Secondary | ICD-10-CM | POA: Diagnosis not present

## 2023-07-13 DIAGNOSIS — Z23 Encounter for immunization: Secondary | ICD-10-CM

## 2023-07-13 MED ORDER — ATORVASTATIN CALCIUM 20 MG PO TABS: 20.0000 mg | ORAL_TABLET | Freq: Every day | ORAL | 3 refills | Status: DC

## 2023-07-13 NOTE — Assessment & Plan Note (Signed)
 Recommend limit added sugar in diet.

## 2023-07-13 NOTE — Assessment & Plan Note (Signed)
 Preventative protocols reviewed and updated unless pt declined. Discussed healthy diet and lifestyle.

## 2023-07-13 NOTE — Assessment & Plan Note (Signed)
 Regularly sees eye doctor - planning to start eye drops for glaucoma

## 2023-07-13 NOTE — Assessment & Plan Note (Signed)
 Continue aspirin, start statin as per above.

## 2023-07-13 NOTE — Assessment & Plan Note (Signed)
 Harshening murmur noted. Reviewed latest echo 06/2021 - anticipate progression from then so will update echocardiogram

## 2023-07-13 NOTE — Assessment & Plan Note (Signed)
 Regularly sees urology [facility]) under active surveillance - appreciate their care.

## 2023-07-13 NOTE — Assessment & Plan Note (Signed)
 Chronic, stable. Continue current regimen.

## 2023-07-13 NOTE — Assessment & Plan Note (Addendum)
 Reviewed indications to be on cholesterol medicine given calcium  score and known non-obstructive CAD. He agrees to start atorva 20mg  daily. The 10-year ASCVD risk score (Arnett DK, et al., 2019) is: 27%   Values used to calculate the score:     Age: 78 years     Sex: Male     Is Non-Hispanic African American: No     Diabetic: No     Tobacco smoker: No     Systolic Blood Pressure: 136 mmHg     Is BP treated: Yes     HDL Cholesterol: 64.7 mg/dL     Total Cholesterol: 161 mg/dL

## 2023-07-13 NOTE — Patient Instructions (Addendum)
 Flu shot today.  Try atorvastatin  20mg  daily - sent to pharmacy. Let me know if any trouble tolerating this.  We will order repeat cologuard  Will order repeat heart ultrasound to evaluation leaky mitral valve.  Good to see you today  Return as needed or in 1 year for next physical/wellness visit.
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
## 2023-07-13 NOTE — Assessment & Plan Note (Signed)
 Advanced directive: received, scanned 07/2023. Wife [place] then daughter Lyla Son are HCPOA. Would not want prolonged life if terminal condition, doesn't want feeding tube.

## 2023-07-26 DIAGNOSIS — H40153 Residual stage of open-angle glaucoma, bilateral: Secondary | ICD-10-CM | POA: Diagnosis not present

## 2023-07-31 ENCOUNTER — Other Ambulatory Visit: Payer: Self-pay | Admitting: Internal Medicine

## 2023-07-31 DIAGNOSIS — R058 Other specified cough: Secondary | ICD-10-CM

## 2023-08-07 DIAGNOSIS — Z1211 Encounter for screening for malignant neoplasm of colon: Secondary | ICD-10-CM | POA: Diagnosis not present

## 2023-08-14 LAB — COLOGUARD: COLOGUARD: NEGATIVE

## 2023-08-15 ENCOUNTER — Encounter: Payer: Self-pay | Admitting: Family Medicine

## 2023-08-20 ENCOUNTER — Ambulatory Visit: Payer: PPO | Attending: Family Medicine

## 2023-08-20 DIAGNOSIS — I340 Nonrheumatic mitral (valve) insufficiency: Secondary | ICD-10-CM

## 2023-08-20 LAB — ECHOCARDIOGRAM COMPLETE
AV Mean grad: 4 mm[Hg]
AV Peak grad: 7.5 mm[Hg]
Ao pk vel: 1.37 m/s
Area-P 1/2: 5.02 cm2
MV M vel: 5.24 m/s
MV Peak grad: 109.8 mm[Hg]
Radius: 0.5 cm
S' Lateral: 3.6 cm

## 2023-08-23 ENCOUNTER — Encounter: Payer: Self-pay | Admitting: Family Medicine

## 2023-09-10 ENCOUNTER — Ambulatory Visit: Payer: PPO | Admitting: Dermatology

## 2023-09-10 ENCOUNTER — Other Ambulatory Visit: Payer: Self-pay | Admitting: Family Medicine

## 2023-09-10 DIAGNOSIS — I10 Essential (primary) hypertension: Secondary | ICD-10-CM

## 2023-10-12 ENCOUNTER — Encounter: Payer: Self-pay | Admitting: Nurse Practitioner

## 2023-10-12 ENCOUNTER — Ambulatory Visit: Attending: Nurse Practitioner | Admitting: Nurse Practitioner

## 2023-10-12 VITALS — BP 134/82 | HR 67 | Ht 68.0 in | Wt 175.0 lb

## 2023-10-12 DIAGNOSIS — R911 Solitary pulmonary nodule: Secondary | ICD-10-CM

## 2023-10-12 DIAGNOSIS — I340 Nonrheumatic mitral (valve) insufficiency: Secondary | ICD-10-CM

## 2023-10-12 DIAGNOSIS — I10 Essential (primary) hypertension: Secondary | ICD-10-CM | POA: Diagnosis not present

## 2023-10-12 DIAGNOSIS — E785 Hyperlipidemia, unspecified: Secondary | ICD-10-CM

## 2023-10-12 DIAGNOSIS — I2510 Atherosclerotic heart disease of native coronary artery without angina pectoris: Secondary | ICD-10-CM

## 2023-10-12 NOTE — Patient Instructions (Signed)
 Medication Instructions:  No changes *If you need a refill on your cardiac medications before your next appointment, please call your pharmacy*  Lab Work: None ordered If you have labs (blood work) drawn today and your tests are completely normal, you will receive your results only by: MyChart Message (if you have MyChart) OR A paper copy in the mail If you have any lab test that is abnormal or we need to change your treatment, we will call you to review the results.  Testing/Procedures: Chest CT scanning, (CAT scanning), is a noninvasive, special x-ray that produces cross-sectional images of the body using x-rays and a computer. CT scans help physicians diagnose and treat medical conditions. For some CT exams, a contrast material is used to enhance visibility in the area of the body being studied. CT scans provide greater clarity and reveal more details than regular x-ray exams.  [facility] [address]  [place], [place] [address]  Or  [facility]  [address]  [place], [place] [address]   Follow-Up: At [facility], you and your health needs are our priority.  As part of our continuing mission to provide you with exceptional heart care, our providers are all part of one team.  This team includes your primary Cardiologist (physician) and Advanced Practice Providers or APPs (Physician Assistants and Nurse Practitioners) who all work together to provide you with the care you need, when you need it.  Your next appointment:   12 month(s)  Provider:   You may see Dr. Azucena Cecil or one of the following Advanced Practice Providers on your designated Care Team:   Nicolasa Ducking, NP  We recommend signing up for the patient portal called "MyChart".  Sign up information is provided on this After Visit Summary.  MyChart is used to connect with patients for Virtual Visits (Telemedicine).  Patients are able to view lab/test  results, encounter notes, upcoming appointments, etc.  Non-urgent messages can be sent to your provider as well.   To learn more about what you can do with MyChart, go to [email or web address].

## 2023-10-12 NOTE — Progress Notes (Signed)
 Office Visit    Patient Name: Aaron Brown. Date of Encounter: 10/12/2023  Primary Care Provider:  Eustaquio Boyden, MD Primary Cardiologist:  Debbe Odea, MD  Chief Complaint    78 y.o. male with a history of nonobstructive CAD, hypertension, hyperlipidemia, prediabetes, moderate mitral regurgitation, basal cell carcinoma, prostate cancer, cerebral microvascular disease, asymmetrical left neurosensory hearing loss, left lower lobe lung nodule, and seasonal allergies, who presents for follow-up related to CAD and moderate MR.  Past Medical History  Subjective   Past Medical History:  Diagnosis Date   Actinic keratosis    Asymmetrical left sensorineural hearing loss 12/2013   s/p MRI pending hearing aides [facility])   Basal cell carcinoma 10/21/2019   L ala crease   Basal cell carcinoma 11/11/2018   R chin/excision   Basal cell carcinoma 03/12/2018   L mid forearm   Basal cell carcinoma 05/30/2016   R inf clavicle, L lateral canthus   Basal cell carcinoma 01/06/2014   L elbow   Basal cell carcinoma 11/02/2020   L mid forearm, recurrent. Exc 12/27/2020.   Basal cell carcinoma 05/02/2022   R nasal ala - Mohs 07/21/2022   Basal cell carcinoma 12/05/2022   Posterior neck, EDC   Blood in stool    in the past; ? hemorrhoids   CAD (coronary artery disease)    a. 12/2021 Cor CTA: LM nl, LAD 25-49p, D1 25-49p, LCX nl, OM1 25-49, RCA nl. Ca2= = 462 (64th %'ile).   Cerebral microvascular disease 12/2013   by MRI, referred to neuro by ENT   Colon polyp 05/2005   ?hyperplastic   Diastolic dysfunction    a. 08/2023 Echo: EF 55-60%, GrI DD.   History of chicken pox    Hypertension    Increased pressure in the eye    Dr. Alvester Morin   Lower back pain    bulging disc with spinal stenosis L4/5 s/p surgery x2   Moderate mitral regurgitation    a. 08/2023 Echo: EF 55-60%, no rwma, GrI DD, nl RV size/fxn, mod MR.   Nodule of lower lobe of left lung    a. 12/2021 CT Chest: 6.56mm  LLL nodule - rec 6-12 mos f/u.   Prediabetes    A1c 6.0 (2012)   Prostate cancer (HCC)    Seasonal allergies    Squamous acanthoma of face    Squamous cell cancer of skin of hand 10/22/2018   R hand dorsum below first webspace   Squamous cell carcinoma of neck 06/26/2013   Right anterior neck/excision   Squamous cell carcinoma of skin 11/17/2019   Left dorsal hand. SCCis hypertrophic.   Past Surgical History:  Procedure Laterality Date   BUNIONECTOMY  2003   Dr. Elvin So   COLONOSCOPY  05/2005   1 polyp, int hem    DOBUTAMINE STRESS ECHO  2013   normal per prior PCP records   hemi-laminectomy  2000   Dr. Jordan Likes, Dr. Gerlene Fee L4/L5   LAMINECTOMY  1988   Dr. Hope Pigeon; L5   SQUAMOUS CELL CARCINOMA EXCISION  2014   face and hand    Allergies  Allergies  Allergen Reactions   Hydrochlorothiazide Other (See Comments)    Mild drop in sodium   Lisinopril     May have caused lip swelling      History of Present Illness      78 y.o. y/o male with a history of nonobstructive CAD, hypertension, hyperlipidemia, prediabetes, moderate mitral regurgitation, basal cell carcinoma, prostate  cancer, cerebral microvascular disease, asymmetrical left neurosensory hearing loss, left lower lobe lung nodule, and seasonal allergies.  He was previously evaluated for dyspnea and left-sided chest discomfort in December 2023.  Coronary CT angiogram at that time showed mild, nonobstructive LAD, first diagonal, and OM1 disease (25 to 49%) and a calcium score of 462 (64 percentile).  Radiology overread also showed a 6.5 mm left lower lobe nodule with recommendation for follow-up noncontrast CT in 6 to 12 months.  From a cardiac standpoint, he was medically managed with aspirin and statin therapy.  Most recent echo in February 2025 showed an EF of 55 to 60% with grade 1 diastolic dysfunction and moderate MR.   Mr. Lamarque was last seen in cardiology clinic in November 2023, at which time he was doing well.  Over  the past 18 months, Mr. Attia has continued to do well.  He walks about 2 miles daily at a brisk pace without symptoms or limitations.  He also was active in his yard and around his house.  He denies chest pain, palpitations, PND, orthopnea, dizziness, syncope, edema, or early satiety. Objective  Home Medications    Current Outpatient Medications  Medication Sig Dispense Refill   amLODipine (NORVASC) 5 MG tablet TAKE 1 TABLET (5 MG TOTAL) BY MOUTH DAILY. 90 tablet 4   aspirin EC 81 MG tablet Take 1 tablet (81 mg total) by mouth daily. Swallow whole.     atorvastatin (LIPITOR) 20 MG tablet Take 1 tablet (20 mg total) by mouth daily. 90 tablet 3   beta carotene w/minerals (OCUVITE) tablet Take 1 tablet by mouth daily.     calcipotriene (DOVONOX) 0.005 % cream Apply to the nose BID x 4 days. 60 g 0   cholecalciferol (VITAMIN D) 1000 UNITS tablet Take 1,000 Units by mouth daily.     cyclobenzaprine (FLEXERIL) 10 MG tablet Take 1 tablet (10 mg total) by mouth 2 (two) times daily as needed for muscle spasms. 40 tablet 0   etodolac (LODINE) 400 MG tablet Take 1 tablet (400 mg total) by mouth 2 (two) times daily as needed. 40 tablet 0   famotidine (PEPCID) 20 MG tablet TAKE 1 TABLET BY MOUTH DAILY AFTER SUPPER 90 tablet 0   fluorouracil (EFUDEX) 5 % cream Apply to nose BID x 4 days. 40 g 0   tadalafil (CIALIS) 20 MG tablet TAKE ONE TABLET ONE HOUR PRIOR TO INTERCOURSE 10 tablet 0   valsartan (DIOVAN) 160 MG tablet Take 1 tablet (160 mg total) by mouth daily. 90 tablet 2   No current facility-administered medications for this visit.     Physical Exam    VS:  BP 134/82   Pulse 67   Ht 5\' 8"  (1.727 m)   Wt 175 lb (79.4 kg)   SpO2 98%   BMI 26.61 kg/m  , BMI Body mass index is 26.61 kg/m.       GEN: Well nourished, well developed, in no acute distress. HEENT: normal. Neck: Supple, no JVD, carotid bruits, or masses. Cardiac: RRR.  I do not appreciate a murmur on exam today.  No  rubs/gallops. No clubbing, cyanosis, edema.  Radials 2+/PT 2+ and equal bilaterally.  Respiratory:  Respirations regular and unlabored, clear to auscultation bilaterally. GI: Soft, nontender, nondistended, BS + x 4. MS: no deformity or atrophy. Skin: warm and dry, no rash. Neuro:  Strength and sensation are intact. Psych: Normal affect.  Accessory Clinical Findings    ECG personally reviewed by me today -  EKG Interpretation Date/Time:  Friday October 12 2023 08:25:51 EDT Ventricular Rate:  76 PR Interval:  164 QRS Duration:  102 QT Interval:  354 QTC Calculation: 398 R Axis:   -59  Text Interpretation: Normal sinus rhythm Left axis deviation Nonspecific ST and T wave abnormality Confirmed by Nicolasa Ducking [phone]) on 10/12/2023 8:33:03 AM  - no acute changes.  Lab Results  Component Value Date   WBC 6.6 05/27/2023   HGB 14.5 05/27/2023   HCT 41.3 05/27/2023   MCV 91.8 05/27/2023   PLT 168 05/27/2023   Lab Results  Component Value Date   CREATININE 1.14 07/06/2023   BUN 17 07/06/2023   NA 135 07/06/2023   K 4.4 07/06/2023   CL 101 07/06/2023   CO2 23 07/06/2023   Lab Results  Component Value Date   ALT 20 07/06/2023   AST 33 07/06/2023   ALKPHOS 62 07/06/2023   BILITOT 0.6 07/06/2023   Lab Results  Component Value Date   CHOL 161 07/06/2023   HDL 64.70 07/06/2023   LDLCALC 87 07/06/2023   TRIG 43.0 07/06/2023   CHOLHDL 2 07/06/2023    Lab Results  Component Value Date   HGBA1C 6.2 07/06/2023   Lab Results  Component Value Date   TSH 3.36 11/04/2018       Assessment & Plan    1.  Precordial pain/nonobstructive CAD: Patient with a history of chest pain which he describes as twinges, leading to coronary CT angiogram in July 2023 showing mild, nonobstructive LAD, diagonal, and obtuse marginal disease.  He has been medically managed with aspirin and statin therapy.  He has not had any significant chest pain or limitations in activity over the past 18  months, exercising daily and walking at a brisk pace.  No changes to medications today.  2.  Moderate mitral regurgitation: Stable on echo earlier this year.  Patient is asymptomatic.  Recent primary care note indicates a 4/6 systolic murmur however, I do not appreciate a murmur on examination today, nor a thrill.  Plan for follow-up echo in 1 year.  3.  Primary hypertension: Stable on ARB and calcium channel blocker therapy.  4.  Hyperlipidemia: Patient on low-dose atorvastatin with a recent LDL of 87.  We discussed this today.  Ideally, would like to see him below 70.  Over the years, he has intermittently been in the 60s.  He does not think he has changed his diet much and is very conscious about his cholesterol intake.  He will try and do better to reduce chicken, dairy/cheese.  We discussed that if lifestyle modifications are insufficient, we could consider titrating atorvastatin further.  5.  Left lower lobe lung nodule: 6.5 mm left lower lobe lung nodule noted on coronary CT angiogram in July 2023 with recommendation for 6 to 86-month noncontrast chest CT follow-up.  This has not been performed.  Discussed with patient today who is also wondering why this has not been followed.  I will arrange for noncontrast chest CT.  6.  Disposition: Follow-up noncontrast chest CT.  Follow-up in clinic in 1 year or sooner if necessary.  Nicolasa Ducking, NP 10/12/2023, 8:35 AM

## 2023-10-23 ENCOUNTER — Ambulatory Visit
Admission: RE | Admit: 2023-10-23 | Discharge: 2023-10-23 | Disposition: A | Discharge status: Home / Self Care | Source: Ambulatory Visit | Attending: Nurse Practitioner | Admitting: Nurse Practitioner

## 2023-10-23 DIAGNOSIS — I700 Atherosclerosis of aorta: Secondary | ICD-10-CM | POA: Diagnosis not present

## 2023-10-23 DIAGNOSIS — R918 Other nonspecific abnormal finding of lung field: Secondary | ICD-10-CM | POA: Diagnosis not present

## 2023-10-23 DIAGNOSIS — R911 Solitary pulmonary nodule: Secondary | ICD-10-CM | POA: Diagnosis not present

## 2023-10-23 DIAGNOSIS — K449 Diaphragmatic hernia without obstruction or gangrene: Secondary | ICD-10-CM | POA: Diagnosis not present

## 2023-11-09 ENCOUNTER — Other Ambulatory Visit: Payer: Self-pay

## 2023-11-09 DIAGNOSIS — C61 Malignant neoplasm of prostate: Secondary | ICD-10-CM

## 2023-11-10 LAB — PSA: Prostate Specific Ag, Serum: 4.2 ng/mL — ABNORMAL HIGH (ref 0.0–4.0)

## 2023-11-12 DIAGNOSIS — H40153 Residual stage of open-angle glaucoma, bilateral: Secondary | ICD-10-CM | POA: Diagnosis not present

## 2023-11-14 ENCOUNTER — Encounter: Payer: Self-pay | Admitting: Urology

## 2023-11-14 ENCOUNTER — Ambulatory Visit: Payer: Self-pay | Admitting: Urology

## 2023-11-14 VITALS — BP 148/75 | HR 80 | Ht 68.0 in | Wt 168.0 lb

## 2023-11-14 DIAGNOSIS — C61 Malignant neoplasm of prostate: Secondary | ICD-10-CM | POA: Diagnosis not present

## 2023-11-14 NOTE — Progress Notes (Signed)
 I, Maysun Jamey Mccallum, acting as a Neurosurgeon for Geraline Knapp, MD., have documented all relevant documentation on the behalf of Geraline Knapp, MD, as directed by Geraline Knapp, MD while in the presence of Geraline Knapp, MD.  Discussed the use of AI scribe software for clinical note transcription with the patient, who gave verbal consent to proceed.   11/14/2023 3:40 PM   Aaron Brown. [birth year] [id number]  Referring provider: Claire Crick, MD [address] [place],  [place] [address]  Chief Complaint  Patient presents with   Prostate Cancer   Urologic history: 1.  Prostate cancer -T1c low risk -Fusion biopsy 08/2020 PI-RADS 4/PI-RADS 3 lesions; 46 g gland -PSA 5.1 -ROI lesions benign -Gleason 3+3 adenocarcinoma RLM, RA, RLA (5%, 20%, 5%) -Confirmatory biopsy 11/02/2022; 53-gram prostate; path 12/12 cores showed no prostate cancer.   [name] is a 78 y.o. male presents for annual follow-up.  Doing well since last visit No bothersome LUTS Denies dysuria, gross hematuria Denies flank, abdominal or pelvic pain PSA 11/09/2023 4.2   PSA trend   Prostate Specific Ag, Serum  Latest Ref Rng 0.0 - 4.0 ng/mL  06/16/2020 5.1 (H)   03/10/2021 4.8 (H)   09/13/2021 4.4 (H)   03/23/2022 4.4 (H)   05/16/2023 4.7 (H)   11/09/2023 4.2 (H)      PMH: Past Medical History:  Diagnosis Date   Actinic keratosis    Asymmetrical left sensorineural hearing loss 12/2013   s/p MRI pending hearing aides [employer])   Basal cell carcinoma 10/21/2019   L ala crease   Basal cell carcinoma 11/11/2018   R chin/excision   Basal cell carcinoma 03/12/2018   L mid forearm   Basal cell carcinoma 05/30/2016   R inf clavicle, L lateral canthus   Basal cell carcinoma 01/06/2014   L elbow   Basal cell carcinoma 11/02/2020   L mid forearm, recurrent. Exc 12/27/2020.   Basal cell carcinoma 05/02/2022   R nasal ala - Mohs 07/21/2022   Basal cell carcinoma 12/05/2022    Posterior neck, EDC   Blood in stool    in the past; ? hemorrhoids   CAD (coronary artery disease)    a. 12/2021 Cor CTA: LM nl, LAD 25-49p, D1 25-49p, LCX nl, OM1 25-49, RCA nl. Ca2= = 462 (64th %'ile).   Cerebral microvascular disease 12/2013   by MRI, referred to neuro by ENT   Colon polyp 05/2005   ?hyperplastic   Diastolic dysfunction    a. 08/2023 Echo: EF 55-60%, GrI DD.   History of chicken pox    Hypertension    Increased pressure in the eye    Dr. Parke Boll   Lower back pain    bulging disc with spinal stenosis L4/5 s/p surgery x2   Moderate mitral regurgitation    a. 08/2023 Echo: EF 55-60%, no rwma, GrI DD, nl RV size/fxn, mod MR.   Nodule of lower lobe of left lung    a. 12/2021 CT Chest: 6.56mm LLL nodule - rec 6-12 mos f/u.   Prediabetes    A1c 6.0 (2012)   Prostate cancer (HCC)    Seasonal allergies    Squamous acanthoma of face    Squamous cell cancer of skin of hand 10/22/2018   R hand dorsum below first webspace   Squamous cell carcinoma of neck 06/26/2013   Right anterior neck/excision   Squamous cell carcinoma of skin 11/17/2019   Left  dorsal hand. SCCis hypertrophic.    Surgical History: Past Surgical History:  Procedure Laterality Date   BUNIONECTOMY  2003   Dr. Meriam Stamp   COLONOSCOPY  05/2005   1 polyp, int hem    DOBUTAMINE  STRESS ECHO  2013   normal per prior PCP records   hemi-laminectomy  2000   Dr. Adonis Alamin, Dr. Selestino Dakin L4/L5   LAMINECTOMY  1988   Dr. Austin Blumenthal; L5   SQUAMOUS CELL CARCINOMA EXCISION  2014   face and hand    Home Medications:  Allergies as of 11/14/2023       Reactions   Hydrochlorothiazide  Other (See Comments)   Mild drop in sodium   Lisinopril     May have caused lip swelling        Medication List        Accurate as of Nov 14, 2023  3:40 PM. If you have any questions, ask your nurse or doctor.          amLODipine  5 MG tablet Commonly known as: NORVASC  TAKE 1 TABLET (5 MG TOTAL) BY MOUTH DAILY.   aspirin  EC  81 MG tablet Take 1 tablet (81 mg total) by mouth daily. Swallow whole.   atorvastatin  20 MG tablet Commonly known as: LIPITOR Take 1 tablet (20 mg total) by mouth daily.   beta carotene w/minerals tablet Take 1 tablet by mouth daily.   calcipotriene  0.005 % cream Commonly known as: DOVONOX Apply to the nose BID x 4 days.   cholecalciferol 1000 units tablet Commonly known as: VITAMIN D Take 1,000 Units by mouth daily.   cyclobenzaprine  10 MG tablet Commonly known as: FLEXERIL  Take 1 tablet (10 mg total) by mouth 2 (two) times daily as needed for muscle spasms.   etodolac  400 MG tablet Commonly known as: LODINE  Take 1 tablet (400 mg total) by mouth 2 (two) times daily as needed.   famotidine  20 MG tablet Commonly known as: PEPCID  TAKE 1 TABLET BY MOUTH DAILY AFTER SUPPER   fluorouracil  5 % cream Commonly known as: EFUDEX  Apply to nose BID x 4 days.   tadalafil  20 MG tablet Commonly known as: CIALIS  TAKE ONE TABLET ONE HOUR PRIOR TO INTERCOURSE   valsartan  160 MG tablet Commonly known as: Diovan  Take 1 tablet (160 mg total) by mouth daily.        Allergies:  Allergies  Allergen Reactions   Hydrochlorothiazide  Other (See Comments)    Mild drop in sodium   Lisinopril      May have caused lip swelling    Family History: Family History  Problem Relation Age of Onset   Hypertension Mother    Stroke Mother    Arthritis Father    Hypertension Father    Cancer Maternal Aunt        colon   Alcohol abuse Maternal Grandfather    Cancer Cousin        colon   Cancer Cousin        breast    Social History:  reports that he has never smoked. He has never used smokeless tobacco. He reports current alcohol use. He reports that he does not use drugs.   Physical Exam: BP (!) 148/75   Pulse 80   Ht 5\' 8"  (1.727 m)   Wt 168 lb (76.2 kg)   BMI 25.54 kg/m   Constitutional:  Alert and oriented, No acute distress. HEENT: [place] AT Respiratory: Normal respiratory  effort, no increased work of breathing. GU: Prostate 50 grams, smooth without  nodules.  Psychiatric: Normal mood and affect.   Assessment & Plan:    1. T1c low risk prostate cancer PSA remains stable Schedule lab visit/PSA in 6 months and office visit 1 year for PSA/DRE.  I have reviewed the above documentation for accuracy and completeness, and I agree with the above.   Geraline Knapp, MD  [facility] [address], [address] [place], [place] [address] [phone]

## 2023-12-08 ENCOUNTER — Other Ambulatory Visit: Payer: Self-pay | Admitting: Family Medicine

## 2023-12-08 DIAGNOSIS — I10 Essential (primary) hypertension: Secondary | ICD-10-CM

## 2023-12-11 DIAGNOSIS — H6121 Impacted cerumen, right ear: Secondary | ICD-10-CM | POA: Diagnosis not present

## 2023-12-11 DIAGNOSIS — J069 Acute upper respiratory infection, unspecified: Secondary | ICD-10-CM | POA: Diagnosis not present

## 2023-12-24 ENCOUNTER — Ambulatory Visit: Payer: PPO | Admitting: Dermatology

## 2023-12-24 DIAGNOSIS — W908XXA Exposure to other nonionizing radiation, initial encounter: Secondary | ICD-10-CM

## 2023-12-24 DIAGNOSIS — Z79899 Other long term (current) drug therapy: Secondary | ICD-10-CM

## 2023-12-24 DIAGNOSIS — D229 Melanocytic nevi, unspecified: Secondary | ICD-10-CM | POA: Diagnosis not present

## 2023-12-24 DIAGNOSIS — L739 Follicular disorder, unspecified: Secondary | ICD-10-CM | POA: Diagnosis not present

## 2023-12-24 DIAGNOSIS — D692 Other nonthrombocytopenic purpura: Secondary | ICD-10-CM | POA: Diagnosis not present

## 2023-12-24 DIAGNOSIS — Z1283 Encounter for screening for malignant neoplasm of skin: Secondary | ICD-10-CM

## 2023-12-24 DIAGNOSIS — L814 Other melanin hyperpigmentation: Secondary | ICD-10-CM

## 2023-12-24 DIAGNOSIS — L570 Actinic keratosis: Secondary | ICD-10-CM | POA: Diagnosis not present

## 2023-12-24 DIAGNOSIS — Z85828 Personal history of other malignant neoplasm of skin: Secondary | ICD-10-CM

## 2023-12-24 DIAGNOSIS — D1801 Hemangioma of skin and subcutaneous tissue: Secondary | ICD-10-CM

## 2023-12-24 DIAGNOSIS — L578 Other skin changes due to chronic exposure to nonionizing radiation: Secondary | ICD-10-CM | POA: Diagnosis not present

## 2023-12-24 DIAGNOSIS — Z7189 Other specified counseling: Secondary | ICD-10-CM

## 2023-12-24 DIAGNOSIS — L821 Other seborrheic keratosis: Secondary | ICD-10-CM | POA: Diagnosis not present

## 2023-12-24 DIAGNOSIS — D225 Melanocytic nevi of trunk: Secondary | ICD-10-CM

## 2023-12-24 MED ORDER — CLINDAMYCIN PHOSPHATE 1 % EX SOLN: CUTANEOUS | 5 refills | Status: AC

## 2023-12-24 NOTE — Patient Instructions (Addendum)
 - Start 5-fluorouracil /calcipotriene  cream twice a day for 4-5 days to affected areas including nose, scalp x 7-10 days.  Reviewed course of treatment and expected reaction.  Patient advised to expect inflammation and crusting and advised that erosions are possible.  Patient advised to be diligent with sun protection during and after treatment. Counseled to keep medication out of reach of children and pets.   5-Fluorouracil /Calcipotriene  Patient Education   Actinic keratoses are the dry, red scaly spots on the skin caused by sun damage. A portion of these spots can turn into skin cancer with time, and treating them can help prevent development of skin cancer.   Treatment of these spots requires removal of the defective skin cells. There are various ways to remove actinic keratoses, including freezing with liquid nitrogen, treatment with creams, or treatment with a blue light procedure in the office.   5-fluorouracil  cream is a topical cream used to treat actinic keratoses. It works by interfering with the growth of abnormal fast-growing skin cells, such as actinic keratoses. These cells peel off and are replaced by healthy ones.   5-fluorouracil /calcipotriene  is a combination of the 5-fluorouracil  cream with a vitamin D analog cream called calcipotriene . The calcipotriene  alone does not treat actinic keratoses. However, when it is combined with 5-fluorouracil , it helps the 5-fluorouracil  treat the actinic keratoses much faster so that the same results can be achieved with a much shorter treatment time.  INSTRUCTIONS FOR 5-FLUOROURACIL /CALCIPOTRIENE  CREAM:   5-fluorouracil /calcipotriene  cream typically only needs to be used for 4-7 days. A thin layer should be applied twice a day to the treatment areas recommended by your physician.   If your physician prescribed you separate tubes of 5-fluourouracil and calcipotriene , apply a thin layer of 5-fluorouracil  followed by a thin layer of calcipotriene .    Avoid contact with your eyes, nostrils, and mouth. Do not use 5-fluorouracil /calcipotriene  cream on infected or open wounds.   You will develop redness, irritation and some crusting at areas where you have pre-cancer damage/actinic keratoses. IF YOU DEVELOP PAIN, BLEEDING, OR SIGNIFICANT CRUSTING, STOP THE TREATMENT EARLY - you have already gotten a good response and the actinic keratoses should clear up well.  Wash your hands after applying 5-fluorouracil  5% cream on your skin.   A moisturizer or sunscreen with a minimum SPF 30 should be applied each morning.   Once you have finished the treatment, you can apply a thin layer of Vaseline twice a day to irritated areas to soothe and calm the areas more quickly. If you experience significant discomfort, contact your physician.  For some patients it is necessary to repeat the treatment for best results.  SIDE EFFECTS: When using 5-fluorouracil /calcipotriene  cream, you may have mild irritation, such as redness, dryness, swelling, or a mild burning sensation. This usually resolves within 2 weeks. The more actinic keratoses you have, the more redness and inflammation you can expect during treatment. Eye irritation has been reported rarely. If this occurs, please let us  know.  If you have any trouble using this cream, please call the office. If you have any other questions about this information, please do not hesitate to ask me before you leave the office. Due to recent changes in healthcare laws, you may see results of your pathology and/or laboratory studies on MyChart before the doctors have had a chance to review them. We understand that in some cases there may be results that are confusing or concerning to you. Please understand that not all results are received at the same  time and often the doctors may need to interpret multiple results in order to provide you with the best plan of care or course of treatment. Therefore, we ask that you please  give us  2 business days to thoroughly review all your results before contacting the office for clarification. Should we see a critical lab result, you will be contacted sooner.   If You Need Anything After Your Visit  If you have any questions or concerns for your doctor, please call our main line at [phone] and press option 4 to reach your doctor's medical assistant. If no one answers, please leave a voicemail as directed and we will return your call as soon as possible. Messages left after 4 pm will be answered the following business day.   You may also send us  a message via MyChart. We typically respond to MyChart messages within 1-2 business days.  For prescription refills, please ask your pharmacy to contact our office. Our fax number is [phone].  If you have an urgent issue when the clinic is closed that cannot wait until the next business day, you can page your doctor at the number below.    Please note that while we do our best to be available for urgent issues outside of office hours, we are not available 24/7.   If you have an urgent issue and are unable to reach us , you may choose to seek medical care at your doctor's office, retail clinic, urgent care center, or emergency room.  If you have a medical emergency, please immediately call 911 or go to the emergency department.  Pager Numbers  - Dr. Hester: [phone]  - Dr. Jackquline: [phone]  - Dr. Claudene: [phone]   In the event of inclement weather, please call our main line at [phone] for an update on the status of any delays or closures.  Dermatology Medication Tips: Please keep the boxes that topical medications come in in order to help keep track of the instructions about where and how to use these. Pharmacies typically print the medication instructions only on the boxes and not directly on the medication tubes.   If your medication is too expensive, please contact our office at [phone]  option 4 or send us  a message through MyChart.   We are unable to tell what your co-pay for medications will be in advance as this is different depending on your insurance coverage. However, we may be able to find a substitute medication at lower cost or fill out paperwork to get insurance to cover a needed medication.   If a prior authorization is required to get your medication covered by your insurance company, please allow us  1-2 business days to complete this process.  Drug prices often vary depending on where the prescription is filled and some pharmacies may offer cheaper prices.  The website www.goodrx.com contains coupons for medications through different pharmacies. The prices here do not account for what the cost may be with help from insurance (it may be cheaper with your insurance), but the website can give you the price if you did not use any insurance.  - You can print the associated coupon and take it with your prescription to the pharmacy.  - You may also stop by our office during regular business hours and pick up a GoodRx coupon card.  - If you need your prescription sent electronically to a different pharmacy, notify our office through [facility] or by phone at [phone] option 4.  Si Usted Necesita Algo Despus de Su Visita  Tambin puede enviarnos un mensaje a travs de Clinical cytogeneticist. Por lo general respondemos a los mensajes de MyChart en el transcurso de 1 a 2 das hbiles.  Para renovar recetas, por favor pida a su farmacia que se ponga en contacto con nuestra oficina. Randi lakes de fax es [place] [phone].  Si tiene un asunto urgente cuando la clnica est cerrada y que no puede esperar hasta el siguiente da hbil, puede llamar/localizar a su doctor(a) al nmero que aparece a continuacin.   Por favor, tenga en cuenta que aunque hacemos todo lo posible para estar disponibles para asuntos urgentes fuera del horario de [place], no estamos disponibles las  24 horas del da, los 7 [address]  [address] de la [place].   Si tiene un problema urgente y no puede comunicarse con nosotros, puede optar por buscar atencin mdica  en el consultorio de su doctor(a), en una clnica privada, en un centro de atencin urgente o en una sala de emergencias.  Si tiene Engineer, drilling, por favor llame inmediatamente al 911 o vaya a la sala de emergencias.  Nmeros de bper  - Dr. Hester: [phone]  - Dra. Jackquline: [phone]  - Dr. Claudene: [phone]   En caso de inclemencias del tiempo, por favor llame a landry capes principal al [phone] para una actualizacin sobre el [place] de cualquier retraso o cierre.  Consejos para la medicacin en dermatologa: Por favor, guarde las cajas en las que vienen los medicamentos de uso tpico para ayudarle a seguir las instrucciones sobre dnde y cmo usarlos. Las farmacias generalmente imprimen las instrucciones del medicamento slo en las cajas y no directamente en los tubos del [place].   Si su medicamento es muy caro, por favor, pngase en contacto con landry rieger llamando al [phone] y presione la opcin 4 o envenos un mensaje a travs de Clinical cytogeneticist.   No podemos decirle cul ser su copago por los medicamentos por adelantado ya que esto es diferente dependiendo de la cobertura de su seguro. Sin embargo, es posible que podamos encontrar un medicamento sustituto a Audiological scientist un formulario para que el seguro cubra el medicamento que se considera necesario.   Si se requiere una autorizacin previa para que su compaa de seguros malta su medicamento, por favor permtanos de 1 a 2 das hbiles para completar este proceso.  Los precios de los medicamentos varan con frecuencia dependiendo del Environmental consultant de dnde se surte la receta y alguna farmacias pueden ofrecer precios ms baratos.  El sitio web www.goodrx.com tiene cupones para medicamentos de Health and safety inspector. Los precios aqu no tienen en cuenta  lo que podra costar con la ayuda del seguro (puede ser ms barato con su seguro), pero el sitio web puede darle el precio si no utiliz Tourist information centre manager.  - Puede imprimir el cupn correspondiente y llevarlo con su receta a la farmacia.  - Tambin puede pasar por nuestra oficina durante el horario de atencin regular y Education officer, museum una tarjeta de cupones de GoodRx.  - Si necesita que su receta se enve electrnicamente a una farmacia diferente, informe a nuestra oficina a travs de MyChart de [place] o por telfono llamando al [phone] y presione la opcin 4.

## 2023-12-24 NOTE — Progress Notes (Signed)
 Follow-Up Visit   Subjective  Aaron Brown. is a 78 y.o. male who presents for the following: Skin Cancer Screening and Upper Body Skin Exam  The patient presents for Upper Body Skin Exam (UBSE) for skin cancer screening and mole check. The patient has spots, moles and lesions to be evaluated, some may be new or changing, including face and neck. History of SCC, BCC, AKs. Patient has 5FU/Calcipotriene  cream, but hasn't treated scalp and arms yet, waiting for cooler weather. He has a history of folliculitis of the scalp, and would like a refills of clindamycin solution.    The following portions of the chart were reviewed this encounter and updated as appropriate: medications, allergies, medical history  Review of Systems:  No other skin or systemic complaints except as noted in HPI or Assessment and Plan.  Objective  Well appearing patient in no apparent distress; mood and affect are within normal limits.  All skin waist up examined. Relevant physical exam findings are noted in the Assessment and Plan.  scalp x 10, L temple x 2, L forehead x 2, L post neck x 1, L mandible x 1, nose x 5, L hand x 2, R hand x 2, R upper arm x 1, R malar cheek x 1, R forehead x 1 (28) Pink scaly macules.   Assessment & Plan   AK (ACTINIC KERATOSIS) (28) scalp x 10, L temple x 2, L forehead x 2, L post neck x 1, L mandible x 1, nose x 5, L hand x 2, R hand x 2, R upper arm x 1, R malar cheek x 1, R forehead x 1 (28) Vs Early BCCs - L post neck, R upper arm. Recheck on f/u.   Actinic keratoses are precancerous spots that appear secondary to cumulative UV radiation exposure/sun exposure over time. They are chronic with expected duration over 1 year. A portion of actinic keratoses will progress to squamous cell carcinoma of the skin. It is not possible to reliably predict which spots will progress to skin cancer and so treatment is recommended to prevent development of skin cancer.  Recommend daily  broad spectrum sunscreen SPF 30+ to sun-exposed areas, reapply every 2 hours as needed.  Recommend staying in the shade or wearing long sleeves, sun glasses (UVA+UVB protection) and wide brim hats (4-inch brim around the entire circumference of the hat). Call for new or changing lesions. Destruction of lesion - scalp x 10, L temple x 2, L forehead x 2, L post neck x 1, L mandible x 1, nose x 5, L hand x 2, R hand x 2, R upper arm x 1, R malar cheek x 1, R forehead x 1 (28)  Destruction method: cryotherapy   Informed consent: discussed and consent obtained   Lesion destroyed using liquid nitrogen: Yes   Region frozen until ice ball extended beyond lesion: Yes   Outcome: patient tolerated procedure well with no complications   Post-procedure details: wound care instructions given   Additional details:  Prior to procedure, discussed risks of blister formation, small wound, skin dyspigmentation, or rare scar following cryotherapy. Recommend Vaseline ointment to treated areas while healing.  Skin cancer screening performed today.  ACTINIC DAMAGE WITH PRECANCEROUS ACTINIC KERATOSES Counseling for Topical Chemotherapy Management: Patient exhibits: - Severe, confluent actinic changes with pre-cancerous actinic keratoses that is secondary to cumulative UV radiation exposure over time - Condition that is severe; chronic, not at goal. - diffuse scaly erythematous macules and papules with  underlying dyspigmentation - Discussed Prescription Field Treatment topical Chemotherapy for Severe, Chronic Confluent Actinic Changes with Pre-Cancerous Actinic Keratoses Field treatment involves treatment of an entire area of skin that has confluent Actinic Changes (Sun/ Ultraviolet light damage) and PreCancerous Actinic Keratoses by method of PhotoDynamic Therapy (PDT) and/or prescription Topical Chemotherapy agents such as 5-fluorouracil , 5-fluorouracil /calcipotriene , and/or imiquimod.  The purpose is to decrease  the number of clinically evident and subclinical PreCancerous lesions to prevent progression to development of skin cancer by chemically destroying early precancer changes that may or may not be visible.  It has been shown to reduce the risk of developing skin cancer in the treated area. As a result of treatment, redness, scaling, crusting, and open sores may occur during treatment course. One or more than one of these methods may be used and may have to be used several times to control, suppress and eliminate the PreCancerous changes. Discussed treatment course, expected reaction, and possible side effects. - Recommend daily broad spectrum sunscreen SPF 30+ to sun-exposed areas, reapply every 2 hours as needed.  - Staying in the shade or wearing long sleeves, sun glasses (UVA+UVB protection) and wide brim hats (4-inch brim around the entire circumference of the hat) are also recommended. - Call for new or changing lesions. - Start 5-fluorouracil /calcipotriene  cream twice a day for 7 days to affected areas including hands, nose (4-5 days), and temples/forehead/scalp. Patient provided with handout reviewing treatment course and side effects and advised to call or message us  on MyChart with any concerns.   Reviewed course of treatment and expected reaction.  Patient advised to expect inflammation and crusting and advised that erosions are possible.  Patient advised to be diligent with sun protection during and after treatment. Counseled to keep medication out of reach of children and pets.   Lentigines, Seborrheic Keratoses (including left clavicle), Hemangiomas - Benign normal skin lesions - Benign-appearing - Call for any changes  Melanocytic Nevi - Tan-brown and/or pink-flesh-colored symmetric macules and papules - 4 x 3 mm regular brown macule at right inframammary - Benign appearing on exam today - Observation - Call clinic for new or changing moles - Recommend daily use of broad spectrum spf  30+ sunscreen to sun-exposed areas.   HISTORY OF BASAL CELL CARCINOMA OF THE SKIN Posterior neck, 2024 Multiple, see History - No evidence of recurrence today - Recommend regular full body skin exams - Recommend daily broad spectrum sunscreen SPF 30+ to sun-exposed areas, reapply every 2 hours as needed.  - Call if any new or changing lesions are noted between office visits   HISTORY OF SQUAMOUS CELL CARCINOMA OF THE SKIN - No evidence of recurrence today - Recommend regular full body skin exams - Recommend daily broad spectrum sunscreen SPF 30+ to sun-exposed areas, reapply every 2 hours as needed.  - Call if any new or changing lesions are noted between office visits  FOLLICULITIS Exam: Pink crusted papules on upper back.  Folliculitis occurs due to inflammation of the superficial hair follicle (pore), resulting in acne-like lesions (pus bumps). It can be infectious (bacterial, fungal) or noninfectious (shaving, tight clothing, heat/sweat, medications).  Folliculitis can be acute or chronic and recommended treatment depends on the underlying cause of folliculitis.  Treatment Plan: Continue clindamycin solution once to twice daily as needed for flares.   Purpura - Chronic; persistent and recurrent.  Treatable, but not curable. - Violaceous macules and patches - Benign - Related to trauma, age, sun damage and/or use of blood thinners, chronic use  of topical and/or oral steroids - Observe - Can use OTC arnica containing moisturizer such as Dermend Bruise Formula if desired - Call for worsening or other concerns   Return in about 6 months (around 06/24/2024) for AKs.  IAndrea Kerns, CMA, am acting as scribe for Rexene Rattler, MD .   Documentation: I have reviewed the above documentation for accuracy and completeness, and I agree with the above.  Rexene Rattler, MD

## 2024-02-11 ENCOUNTER — Encounter: Payer: Self-pay | Admitting: Pharmacist

## 2024-02-11 NOTE — Progress Notes (Signed)
 Pharmacy Quality Measure Review  This patient is appearing on a report for being at risk of failing the adherence measure for cholesterol (statin) medications this calendar year.   Medication: Atorvastatin  20 mg Last fill date: 10/16/23 for 55 day supply  Insurance report was not up to date. No action needed at this time.  Medication has been refilled as of 02/04/2024 x90 day supply.  Rx sold 8/825.  Patient has 1 additional 90d refill remaining.   Patient does have PCP visit scheduled prior to running out of medication refills.  Future Appointments  Date Time Provider Department Center  03/20/2024 11:30 AM [facility] ANNUAL WELLNESS VISIT 1 [facility]  05/16/2024  9:30 AM [facility] None  07/08/2024  7:30 AM [facility] LAB [facility]  07/14/2024  8:45 AM Jackquline Sawyer, MD [facility] None  07/15/2024  8:30 AM Rilla Baller, MD [facility]  11/13/2024 10:30 AM [facility] None  11/14/2024 10:00 AM Stoioff, Glendia BROCKS, MD [facility] None

## 2024-03-11 DIAGNOSIS — H40153 Residual stage of open-angle glaucoma, bilateral: Secondary | ICD-10-CM | POA: Diagnosis not present

## 2024-03-20 ENCOUNTER — Ambulatory Visit (INDEPENDENT_AMBULATORY_CARE_PROVIDER_SITE_OTHER): Payer: PPO

## 2024-03-20 VITALS — Ht 68.0 in | Wt 168.0 lb

## 2024-03-20 DIAGNOSIS — Z0000 Encounter for general adult medical examination without abnormal findings: Secondary | ICD-10-CM

## 2024-03-20 NOTE — Patient Instructions (Signed)
 Mr. Aaron Brown,  Thank you for taking the time for your Medicare Wellness Visit. I appreciate your continued commitment to your health goals. Please review the care plan we discussed, and feel free to reach out if I can assist you further.  Medicare recommends these wellness visits once per year to help you and your care team stay ahead of potential health issues. These visits are designed to focus on prevention, allowing your provider to concentrate on managing your acute and chronic conditions during your regular appointments.  Please note that Annual Wellness Visits do not include a physical exam. Some assessments may be limited, especially if the visit was conducted virtually. If needed, we may recommend a separate in-person follow-up with your provider.  Ongoing Care Seeing your primary care provider every 3 to 6 months helps us  monitor your health and provide consistent, personalized care.   Referrals If a referral was made during today's visit and you haven't received any updates within two weeks, please contact the referred provider directly to check on the status.  Recommended Screenings:  Health Maintenance  Topic Date Due   Zoster (Shingles) Vaccine (1 of 2) 08/01/1964   COVID-19 Vaccine (3 - Pfizer risk series) 11/20/2019   DTaP/Tdap/Td vaccine (2 - Td or Tdap) 12/26/2021   Flu Shot  02/01/2024   Medicare Annual Wellness Visit  03/20/2025   Pneumococcal Vaccine for age over 53  Completed   Hepatitis C Screening  Completed   HPV Vaccine  Aged Out   Meningitis B Vaccine  Aged Out   Cologuard (Stool DNA test)  Discontinued       05/27/2023    8:53 AM  Advanced Directives  Does Patient Have a Medical Advance Directive? No  Would patient like information on creating a medical advance directive? No - Patient declined   Advance Care Planning is important because it: Ensures you receive medical care that aligns with your values, goals, and preferences. Provides guidance to  your family and loved ones, reducing the emotional burden of decision-making during critical moments.  Vision: Annual vision screenings are recommended for early detection of glaucoma, cataracts, and diabetic retinopathy. These exams can also reveal signs of chronic conditions such as diabetes and high blood pressure.  Dental: Annual dental screenings help detect early signs of oral cancer, gum disease, and other conditions linked to overall health, including heart disease and diabetes.

## 2024-03-20 NOTE — Progress Notes (Signed)
 Please attest and cosign this visit due to patients primary care provider not being in the office at the time the visit was completed.     Subjective:   Aaron Swails. is a 78 y.o. who presents for a Medicare Wellness preventive visit.  As a reminder, Annual Wellness Visits don't include a physical exam, and some assessments may be limited, especially if this visit is performed virtually. We may recommend an in-person follow-up visit with your provider if needed.  Visit Complete: Virtual I connected with  Aaron Brown. on 03/20/24 by a video and audio enabled telemedicine application and verified that I am speaking with the correct person using two identifiers.  Patient Location: Home  Provider Location: Office/Clinic  I discussed the limitations of evaluation and management by telemedicine. The patient expressed understanding and agreed to proceed.  Vital Signs: Because this visit was a virtual/telehealth visit, some criteria may be missing or patient reported. Any vitals not documented were not able to be obtained and vitals that have been documented are patient reported.  Persons Participating in Visit: Patient.  AWV Questionnaire: No: Patient Medicare AWV questionnaire was not completed prior to this visit.  Cardiac Risk Factors include: advanced age (>61men, >72 women);dyslipidemia;hypertension;male gender     Objective:    Today's Vitals   03/20/24 1136 03/20/24 1137  Weight: 168 lb (76.2 kg)   Height: 5' 8 (1.727 m)   PainSc:  5    Body mass index is 25.54 kg/m.     03/20/2024   11:46 AM 05/27/2023    8:53 AM 03/13/2023    2:30 PM 05/17/2022    1:27 PM 05/05/2022    8:49 AM 05/04/2021    9:52 AM 03/16/2020    1:22 PM  Advanced Directives  Does Patient Have a Medical Advance Directive? Yes No Yes Yes Yes Yes Yes  Type of Advance Directive Living will;Healthcare Power of [employer] Power of [place];Living will Healthcare Power of  [place];Living will Healthcare Power of [place];Living will Healthcare Power of [place];Living will Healthcare Power of [place];Living will  Does patient want to make changes to medical advance directive?   No - Patient declined No - Patient declined  Yes (MAU/Ambulatory/Procedural Areas - Information given)   Copy of Healthcare Power of Attorney in Chart? No - copy requested  Yes - validated most recent copy scanned in chart (See row information) No - copy requested Yes - validated most recent copy scanned in chart (See row information) Yes - validated most recent copy scanned in chart (See row information) Yes - validated most recent copy scanned in chart (See row information)  Would patient like information on creating a medical advance directive?  No - Patient declined         Current Medications (verified) Outpatient Encounter Medications as of 03/20/2024  Medication Sig   amLODipine  (NORVASC ) 5 MG tablet TAKE 1 TABLET (5 MG TOTAL) BY MOUTH DAILY.   aspirin  EC 81 MG tablet Take 1 tablet (81 mg total) by mouth daily. Swallow whole.   atorvastatin  (LIPITOR) 20 MG tablet Take 1 tablet (20 mg total) by mouth daily.   beta carotene w/minerals (OCUVITE) tablet Take 1 tablet by mouth daily.   calcipotriene  (DOVONOX) 0.005 % cream Apply to the nose BID x 4 days.   cholecalciferol (VITAMIN D) 1000 UNITS tablet Take 1,000 Units by mouth daily.   clindamycin  (CLEOCIN  T) 1 % external solution Apply to affected areas scalp once to twice daily  as needed for flares.   cyclobenzaprine  (FLEXERIL ) 10 MG tablet Take 1 tablet (10 mg total) by mouth 2 (two) times daily as needed for muscle spasms.   etodolac  (LODINE ) 400 MG tablet Take 1 tablet (400 mg total) by mouth 2 (two) times daily as needed.   famotidine  (PEPCID ) 20 MG tablet TAKE 1 TABLET BY MOUTH DAILY AFTER SUPPER   fluorouracil  (EFUDEX ) 5 % cream Apply to nose BID x 4 days.   tadalafil  (CIALIS ) 20 MG tablet TAKE ONE TABLET ONE HOUR PRIOR TO  INTERCOURSE   valsartan  (DIOVAN ) 160 MG tablet TAKE 1 TABLET BY MOUTH EVERY DAY   No facility-administered encounter medications on file as of 03/20/2024.    Allergies (verified) Hydrochlorothiazide  and Lisinopril    History: Past Medical History:  Diagnosis Date   Actinic keratosis    Allergy 2019   lisinipril   Asymmetrical left sensorineural hearing loss 12/2013   s/p MRI pending hearing aides [place])   Basal cell carcinoma 10/21/2019   L ala crease   Basal cell carcinoma 11/11/2018   R chin/excision   Basal cell carcinoma 03/12/2018   L mid forearm   Basal cell carcinoma 05/30/2016   R inf clavicle, L lateral canthus   Basal cell carcinoma 01/06/2014   L elbow   Basal cell carcinoma 11/02/2020   L mid forearm, recurrent. Exc 12/27/2020.   Basal cell carcinoma 05/02/2022   R nasal ala - Mohs 07/21/2022   Basal cell carcinoma 12/05/2022   Posterior neck, EDC   Blood in stool    in the past; ? hemorrhoids   CAD (coronary artery disease)    a. 12/2021 Cor CTA: LM nl, LAD 25-49p, D1 25-49p, LCX nl, OM1 25-49, RCA nl. Ca2= = 462 (64th %'ile).   Cerebral microvascular disease 12/2013   by MRI, referred to neuro by ENT   Colon polyp 05/2005   ?hyperplastic   Diastolic dysfunction    a. 08/2023 Echo: EF 55-60%, GrI DD.   Heart murmur 2018   History of chicken pox    Hypertension    Increased pressure in the eye    Dr. Carolee   Lower back pain    bulging disc with spinal stenosis L4/5 s/p surgery x2   Moderate mitral regurgitation    a. 08/2023 Echo: EF 55-60%, no rwma, GrI DD, nl RV size/fxn, mod MR.   Nodule of lower lobe of left lung    a. 12/2021 CT Chest: 6.69mm LLL nodule - rec 6-12 mos f/u.   Prediabetes    A1c 6.0 (2012)   Prostate cancer (HCC)    Seasonal allergies    Squamous acanthoma of face    Squamous cell cancer of skin of hand 10/22/2018   R hand dorsum below first webspace   Squamous cell carcinoma of neck 06/26/2013   Right anterior neck/excision    Squamous cell carcinoma of skin 11/17/2019   Left dorsal hand. SCCis hypertrophic.   Past Surgical History:  Procedure Laterality Date   BUNIONECTOMY  2003   Dr. Christine   COLONOSCOPY  05/2005   1 polyp, int hem    DOBUTAMINE  STRESS ECHO  2013   normal per prior PCP records   hemi-laminectomy  2000   Dr. Louis, Dr. Carles L4/L5   LAMINECTOMY  1988   Dr. Fredrich; L5   SQUAMOUS CELL CARCINOMA EXCISION  2014   face and hand   Family History  Problem Relation Age of Onset   Hypertension Mother  Stroke Mother    Hearing loss Mother    Arthritis Father    Hypertension Father    COPD Father    Cancer Maternal Aunt        colon   Alcohol abuse Maternal Grandfather    Cancer Cousin        colon   Cancer Cousin        breast   Stroke Paternal Grandfather    Cancer Maternal Aunt    Social History   Socioeconomic History   Marital status: Married    Spouse name: Not on file   Number of children: Not on file   Years of education: Not on file   Highest education level: Bachelor's degree (e.g., BA, AB, BS)  Occupational History   Not on file  Tobacco Use   Smoking status: Never   Smokeless tobacco: Never  Vaping Use   Vaping status: Never Used  Substance and Sexual Activity   Alcohol use: Yes    Alcohol/week: 0.0 standard drinks of alcohol    Comment: Occasional-wine   Drug use: No   Sexual activity: Yes  Other Topics Concern   Not on file  Social History Narrative   Lives with wife   grown children   Occupation: was Emergency planning/management officer, now owns landscaping business   Edu: Chartered certified accountant   Activity: stays active at work   Diet: good water, fruits/vegetables some   Social Drivers of Corporate investment banker Strain: Low Risk  (03/20/2024)   Overall Financial Resource Strain (CARDIA)    Difficulty of Paying Living Expenses: Not hard at all  Food Insecurity: No Food Insecurity (03/20/2024)   Hunger Vital Sign    Worried About Running Out of Food in the Last Year:  Never true    Ran Out of Food in the Last Year: Never true  Transportation Needs: No Transportation Needs (03/20/2024)   PRAPARE - Administrator, Civil Service (Medical): No    Lack of Transportation (Non-Medical): No  Physical Activity: Sufficiently Active (03/20/2024)   Exercise Vital Sign    Days of Exercise per Week: 7 days    Minutes of Exercise per Session: 90 min  Stress: No Stress Concern Present (03/20/2024)   [employer] of Occupational Health - Occupational Stress Questionnaire    Feeling of Stress: Not at all  Social Connections: Moderately Integrated (03/20/2024)   Social Connection and Isolation Panel    Frequency of Communication with Friends and Family: More than three times a week    Frequency of Social Gatherings with Friends and Family: More than three times a week    Attends Religious Services: More than 4 times per year    Active Member of [employer] or Organizations: No    Attends Engineer, structural: Never    Marital Status: Married    Tobacco Counseling Counseling given: Not Answered  Clinical Intake:  Pre-visit preparation completed: Yes  Pain : 0-10 Pain Score: 5  Pain Type: Acute pain Pain Location: Back Pain Descriptors / Indicators: Aching Pain Onset: Yesterday Pain Frequency: Intermittent Pain Relieving Factors: heat and rest  Pain Relieving Factors: heat and rest  BMI - recorded: 25.54 Nutritional Status: BMI 25 -29 Overweight Nutritional Risks: None Diabetes: No  Lab Results  Component Value Date   HGBA1C 6.2 07/06/2023   HGBA1C 6.2 07/04/2022   HGBA1C 6.1 05/11/2021     How often do you need to have someone help you when you read instructions, pamphlets,  or other written materials from your doctor or pharmacy?: 1 - Never  Interpreter Needed?: No  Comments: lives with wife Information entered by :: B.Jaquitta Dupriest,LPN   Activities of Daily Living     03/20/2024   11:58 AM  In your present state of health, do  you have any difficulty performing the following activities:  Hearing? 0  Vision? 0  Difficulty concentrating or making decisions? 0  Walking or climbing stairs? 0  Dressing or bathing? 0  Doing errands, shopping? 0  Preparing Food and eating ? N  Using the Toilet? N  In the past six months, have you accidently leaked urine? N  Do you have problems with loss of bowel control? N  Managing your Medications? N  Managing your Finances? N  Housekeeping or managing your Housekeeping? N    Patient Care Team: Rilla Baller, MD as PCP - General (Family Medicine) Darliss Rogue, MD as PCP - Cardiology (Cardiology) Carolee Manus DASEN., MD as Referring Physician (Ophthalmology) Eura Montie LABOR., MD as Referring Physician (Dentistry) Jackquline Sawyer, MD as Referring Physician (Dermatology) Verta Royden DASEN, [place] as Consulting Physician (Podiatry) Darliss Rogue, MD as Consulting Physician (Cardiology)  I have updated your Care Teams any recent Medical Services you may have received from other providers in the past year.     Assessment:   This is a routine wellness examination for Aaron Brown.  Hearing/Vision screen Hearing Screening - Comments:: Patient denies any hearing difficulties with hearing aids Vision Screening - Comments:: Pt says their vision is good without glasses Dr  Carolee   Goals Addressed             This Visit's Progress    COMPLETED: Increase physical activity       Starting 12/16/2015, I will continue to walk most days 1-2 miles, do landscape work, and do back exercises at night for 1 hr.      Patient Stated   On track    03/20/24- I will continue to take medications as prescribed.      COMPLETED: Patient Stated       03/16/2020, I will continue to walk 2 miles everyday.      COMPLETED: Patient Stated       Would like to lose weight.      COMPLETED: Patient Stated       05/05/2022, wants to lose weight     Patient Stated   Not on track    03/20/24-Lose  15-20 pounds       Depression Screen     03/20/2024   11:42 AM 03/13/2023    2:24 PM 01/08/2023    8:27 AM 05/05/2022    8:49 AM 05/04/2021    9:58 AM 03/16/2020    1:24 PM 01/27/2019    8:31 AM  PHQ 2/9 Scores  PHQ - 2 Score 0 0 0 0 0 0 0  PHQ- 9 Score   2   0     Fall Risk     03/20/2024   11:40 AM 03/13/2023    2:27 PM 01/08/2023    8:27 AM 05/05/2022    8:49 AM 05/04/2021    9:54 AM  Fall Risk   Falls in the past year? 0 0 0 0 1  Number falls in past yr: 0 0  0 0  Injury with Fall? 0 0  0 0  Risk for fall due to : No Fall Risks No Fall Risks  Medication side effect No Fall Risks  Follow up  Education provided;Falls prevention discussed Falls prevention discussed;Falls evaluation completed  Falls prevention discussed;Education provided;Falls evaluation completed  Falls prevention discussed      Data saved with a previous flowsheet row definition    MEDICARE RISK AT HOME:  Medicare Risk at Home Any stairs in or around the home?: Yes If so, are there any without handrails?: Yes Home free of loose throw rugs in walkways, pet beds, electrical cords, etc?: Yes Adequate lighting in your home to reduce risk of falls?: Yes Life alert?: No Use of a cane, walker or w/c?: No Grab bars in the bathroom?: Yes Shower chair or bench in shower?: No Elevated toilet seat or a handicapped toilet?: Yes  TIMED UP AND GO:  Was the test performed?  No  Cognitive Function: 6CIT completed    03/16/2020    1:27 PM 01/07/2018   10:57 AM 12/16/2015    9:34 AM  MMSE - Mini Mental State Exam  Orientation to time 5 5 5    Orientation to Place 5 5 5    Registration 3 3 3    Attention/ Calculation 5 0 0   Recall 3 3 3    Language- name 2 objects  0 0   Language- repeat 1 1 1   Language- follow 3 step command  3 3   Language- read & follow direction  0 0   Write a sentence  0 0   Copy design  0 0   Total score  20 20      Data saved with a previous flowsheet row definition        03/20/2024    11:48 AM 03/13/2023    2:31 PM 05/05/2022    8:51 AM  6CIT Screen  What Year? 0 points 0 points 0 points  What month? 0 points 0 points 0 points  What time? 0 points 0 points 0 points  Count back from 20 0 points 0 points 0 points  Months in reverse 0 points 0 points 0 points  Repeat phrase 0 points 0 points 2 points  Total Score 0 points 0 points 2 points    Immunizations Immunization History  Administered Date(s) Administered   Fluad Quad(high Dose 65+) 03/31/2019, 03/19/2020, 05/11/2021, 03/23/2022   Fluad Trivalent(High Dose 65+) 07/13/2023   Influenza,inj,Quad PF,6+ Mos 08/02/2016, 08/26/2018   PFIZER(Purple Top)SARS-COV-2 Vaccination 10/02/2019, 10/23/2019   Pneumococcal Conjugate-13 12/03/2013   Pneumococcal Polysaccharide-23 11/11/2012   Tdap 12/27/2011   Zoster, Live 07/04/2011    Screening Tests Health Maintenance  Topic Date Due   Zoster Vaccines- Shingrix (1 of 2) 08[birth year]01/1966   COVID-19 Vaccine (3 - Pfizer risk series) 11/20/2019   DTaP/Tdap/Td (2 - Td or Tdap) 12/26/2021   Influenza Vaccine  02/01/2024   Medicare Annual Wellness (AWV)  03/20/2025   Pneumococcal Vaccine: 50+ Years  Completed   Hepatitis C Screening  Completed   HPV VACCINES  Aged Out   Meningococcal B Vaccine  Aged Out   Fecal DNA (Cologuard)  Discontinued    Health Maintenance Items Addressed: None due at this time. Pt will receive vaccines at their pharmcy when decided to obtain   Additional Screening:  Vision Screening: Recommended annual ophthalmology exams for early detection of glaucoma and other disorders of the eye. Is the patient up to date with their annual eye exam?  Yes  Who is the provider or what is the name of the office in which the patient attends annual eye exams? Dr Carolee  Dental Screening: Recommended annual dental exams for proper  oral hygiene  Community Resource Referral / Chronic Care Management: CRR required this visit?  No   CCM required this visit?   No   Plan:    I have personally reviewed and noted the following in the patient's chart:   Medical and social history Use of alcohol, tobacco or illicit drugs  Current medications and supplements including opioid prescriptions. Patient is not currently taking opioid prescriptions. Functional ability and status Nutritional status Physical activity Advanced directives List of other physicians Hospitalizations, surgeries, and ER visits in previous 12 months Vitals Screenings to include cognitive, depression, and falls Referrals and appointments  In addition, I have reviewed and discussed with patient certain preventive protocols, quality metrics, and best practice recommendations. A written personalized care plan for preventive services as well as general preventive health recommendations were provided to patient.   Aaron LITTIE Saris, LPN   [id number]   After Visit Summary: (MyChart) Due to this being a telephonic visit, the after visit summary with patients personalized plan was offered to patient via MyChart   Notes: Nothing significant to report at this time.

## 2024-03-25 ENCOUNTER — Encounter: Payer: Self-pay | Admitting: Pharmacist

## 2024-03-25 NOTE — Progress Notes (Signed)
 Pharmacy Quality Measure Review  This patient is appearing on a report for being at risk of failing the adherence measure for cholesterol (statin) medications this calendar year.   Medication: atorvastatin  20 mg Last fill date: 10/16/23 for 55 day supply  Insurance report was not up to date. No action needed at this time.  Medication has been refilled as of 02/04/24 x90 ds. One additional 90d refill remaining.

## 2024-05-05 ENCOUNTER — Encounter: Payer: Self-pay | Admitting: Pharmacist

## 2024-05-05 NOTE — Progress Notes (Signed)
 Pharmacy Quality Measure Review  This patient is appearing on a report for being at risk of failing the adherence measure for cholesterol (statin) medications this calendar year.   Medication: atorvastatin  20 mg Last fill date: 02/04/24 for 90 day supply  Insurance report was not up to date. No action needed at this time.  Medication refilled as of 04/26/24 x90ds. Next refill 2026. Measure 80%.

## 2024-05-16 ENCOUNTER — Other Ambulatory Visit

## 2024-05-16 DIAGNOSIS — C61 Malignant neoplasm of prostate: Secondary | ICD-10-CM | POA: Diagnosis not present

## 2024-05-17 ENCOUNTER — Ambulatory Visit: Payer: Self-pay | Admitting: Urology

## 2024-05-17 LAB — PSA: Prostate Specific Ag, Serum: 4.1 ng/mL — ABNORMAL HIGH (ref 0.0–4.0)

## 2024-07-05 ENCOUNTER — Other Ambulatory Visit: Payer: Self-pay | Admitting: Family Medicine

## 2024-07-05 DIAGNOSIS — I2510 Atherosclerotic heart disease of native coronary artery without angina pectoris: Secondary | ICD-10-CM

## 2024-07-05 DIAGNOSIS — R7303 Prediabetes: Secondary | ICD-10-CM

## 2024-07-05 DIAGNOSIS — I10 Essential (primary) hypertension: Secondary | ICD-10-CM

## 2024-07-08 ENCOUNTER — Other Ambulatory Visit: Payer: PPO

## 2024-07-10 ENCOUNTER — Ambulatory Visit: Payer: Self-pay | Admitting: Family Medicine

## 2024-07-10 ENCOUNTER — Other Ambulatory Visit (INDEPENDENT_AMBULATORY_CARE_PROVIDER_SITE_OTHER)

## 2024-07-10 DIAGNOSIS — I2510 Atherosclerotic heart disease of native coronary artery without angina pectoris: Secondary | ICD-10-CM

## 2024-07-10 DIAGNOSIS — I10 Essential (primary) hypertension: Secondary | ICD-10-CM

## 2024-07-10 DIAGNOSIS — R7303 Prediabetes: Secondary | ICD-10-CM | POA: Diagnosis not present

## 2024-07-10 LAB — COMPREHENSIVE METABOLIC PANEL WITH GFR
ALT: 27 U/L (ref 3–53)
AST: 33 U/L (ref 5–37)
Albumin: 4.2 g/dL (ref 3.5–5.2)
Alkaline Phosphatase: 55 U/L (ref 39–117)
BUN: 17 mg/dL (ref 6–23)
CO2: 28 meq/L (ref 19–32)
Calcium: 9 mg/dL (ref 8.4–10.5)
Chloride: 102 meq/L (ref 96–112)
Creatinine, Ser: 1.13 mg/dL (ref 0.40–1.50)
GFR: 62.94 mL/min
Glucose, Bld: 103 mg/dL — ABNORMAL HIGH (ref 70–99)
Potassium: 4.7 meq/L (ref 3.5–5.1)
Sodium: 135 meq/L (ref 135–145)
Total Bilirubin: 0.7 mg/dL (ref 0.2–1.2)
Total Protein: 7.3 g/dL (ref 6.0–8.3)

## 2024-07-10 LAB — HEMOGLOBIN A1C: Hgb A1c MFr Bld: 6.2 % (ref 4.6–6.5)

## 2024-07-10 LAB — LIPID PANEL
Cholesterol: 92 mg/dL (ref 28–200)
HDL: 51.4 mg/dL
LDL Cholesterol: 28 mg/dL (ref 10–99)
NonHDL: 40.18
Total CHOL/HDL Ratio: 2
Triglycerides: 62 mg/dL (ref 10.0–149.0)
VLDL: 12.4 mg/dL (ref 0.0–40.0)

## 2024-07-14 ENCOUNTER — Ambulatory Visit: Admitting: Dermatology

## 2024-07-14 DIAGNOSIS — Z85828 Personal history of other malignant neoplasm of skin: Secondary | ICD-10-CM | POA: Diagnosis not present

## 2024-07-14 DIAGNOSIS — Z7189 Other specified counseling: Secondary | ICD-10-CM | POA: Diagnosis not present

## 2024-07-14 DIAGNOSIS — Z79899 Other long term (current) drug therapy: Secondary | ICD-10-CM

## 2024-07-14 DIAGNOSIS — D692 Other nonthrombocytopenic purpura: Secondary | ICD-10-CM | POA: Diagnosis not present

## 2024-07-14 DIAGNOSIS — W908XXA Exposure to other nonionizing radiation, initial encounter: Secondary | ICD-10-CM | POA: Diagnosis not present

## 2024-07-14 DIAGNOSIS — L821 Other seborrheic keratosis: Secondary | ICD-10-CM

## 2024-07-14 DIAGNOSIS — Z872 Personal history of diseases of the skin and subcutaneous tissue: Secondary | ICD-10-CM | POA: Diagnosis not present

## 2024-07-14 DIAGNOSIS — L578 Other skin changes due to chronic exposure to nonionizing radiation: Secondary | ICD-10-CM

## 2024-07-14 DIAGNOSIS — L570 Actinic keratosis: Secondary | ICD-10-CM

## 2024-07-14 NOTE — Patient Instructions (Addendum)
 - Start 5-fluorouracil /calcipotriene  cream twice a day for 7 - 10 days to affected areas including back of hands, behind ears . Prescription sent to [facility]. Patient advised they will receive an email to purchase the medication online and have it sent to their home. Patient provided with handout reviewing treatment course and side effects and advised to call or message us  on MyChart with any concerns.  Reviewed course of treatment and expected reaction.  Patient advised to expect inflammation and crusting and advised that erosions are possible.  Patient advised to be diligent with sun protection during and after treatment. Counseled to keep medication out of reach of children and pets.  5-Fluorouracil /Calcipotriene  Patient Education   Actinic keratoses are the dry, red scaly spots on the skin caused by sun damage. A portion of these spots can turn into skin cancer with time, and treating them can help prevent development of skin cancer.   Treatment of these spots requires removal of the defective skin cells. There are various ways to remove actinic keratoses, including freezing with liquid nitrogen, treatment with creams, or treatment with a blue light procedure in the office.   5-fluorouracil  cream is a topical cream used to treat actinic keratoses. It works by interfering with the growth of abnormal fast-growing skin cells, such as actinic keratoses. These cells peel off and are replaced by healthy ones.   5-fluorouracil /calcipotriene  is a combination of the 5-fluorouracil  cream with a vitamin D analog cream called calcipotriene . The calcipotriene  alone does not treat actinic keratoses. However, when it is combined with 5-fluorouracil , it helps the 5-fluorouracil  treat the actinic keratoses much faster so that the same results can be achieved with a much shorter treatment time.  INSTRUCTIONS FOR 5-FLUOROURACIL /CALCIPOTRIENE  CREAM:   5-fluorouracil /calcipotriene  cream  typically only needs to be used for 4-7 days. A thin layer should be applied twice a day to the treatment areas recommended by your physician.   If your physician prescribed you separate tubes of 5-fluourouracil and calcipotriene , apply a thin layer of 5-fluorouracil  followed by a thin layer of calcipotriene .   Avoid contact with your eyes, nostrils, and mouth. Do not use 5-fluorouracil /calcipotriene  cream on infected or open wounds.   You will develop redness, irritation and some crusting at areas where you have pre-cancer damage/actinic keratoses. IF YOU DEVELOP PAIN, BLEEDING, OR SIGNIFICANT CRUSTING, STOP THE TREATMENT EARLY - you have already gotten a good response and the actinic keratoses should clear up well.  Wash your hands after applying 5-fluorouracil  5% cream on your skin.   A moisturizer or sunscreen with a minimum SPF 30 should be applied each morning.   Once you have finished the treatment, you can apply a thin layer of Vaseline twice a day to irritated areas to soothe and calm the areas more quickly. If you experience significant discomfort, contact your physician.  For some patients it is necessary to repeat the treatment for best results.  SIDE EFFECTS: When using 5-fluorouracil /calcipotriene  cream, you may have mild irritation, such as redness, dryness, swelling, or a mild burning sensation. This usually resolves within 2 weeks. The more actinic keratoses you have, the more redness and inflammation you can expect during treatment. Eye irritation has been reported rarely. If this occurs, please let us  know.  If you have any trouble using this cream, please call the office. If you have any other questions about this information, please do not hesitate to ask me before you leave the office.   Cryotherapy Aftercare  Wash gently  with soap and water everyday.   Apply Vaseline and Band-Aid daily until healed.   Actinic keratoses are precancerous spots that appear secondary to  cumulative UV radiation exposure/sun exposure over time. They are chronic with expected duration over 1 year. A portion of actinic keratoses will progress to squamous cell carcinoma of the skin. It is not possible to reliably predict which spots will progress to skin cancer and so treatment is recommended to prevent development of skin cancer.  Recommend daily broad spectrum sunscreen SPF 30+ to sun-exposed areas, reapply every 2 hours as needed.  Recommend staying in the shade or wearing long sleeves, sun glasses (UVA+UVB protection) and wide brim hats (4-inch brim around the entire circumference of the hat). Call for new or changing lesions.     Due to recent changes in healthcare laws, you may see results of your pathology and/or laboratory studies on MyChart before the doctors have had a chance to review them. We understand that in some cases there may be results that are confusing or concerning to you. Please understand that not all results are received at the same time and often the doctors may need to interpret multiple results in order to provide you with the best plan of care or course of treatment. Therefore, we ask that you please give us  2 business days to thoroughly review all your results before contacting the office for clarification. Should we see a critical lab result, you will be contacted sooner.   If You Need Anything After Your Visit  If you have any questions or concerns for your doctor, please call our main line at [phone] and press option 4 to reach your doctor's medical assistant. If no one answers, please leave a voicemail as directed and we will return your call as soon as possible. Messages left after 4 pm will be answered the following business day.   You may also send us  a message via MyChart. We typically respond to MyChart messages within 1-2 business days.  For prescription refills, please ask your pharmacy to contact our office. Our fax number is  [phone].  If you have an urgent issue when the clinic is closed that cannot wait until the next business day, you can page your doctor at the number below.    Please note that while we do our best to be available for urgent issues outside of office hours, we are not available 24/7.   If you have an urgent issue and are unable to reach us , you may choose to seek medical care at your doctor's office, retail clinic, urgent care center, or emergency room.  If you have a medical emergency, please immediately call 911 or go to the emergency department.  Pager Numbers  - Dr. Hester: [phone]  - Dr. Jackquline: [phone]  - Dr. Claudene: [phone]   - Dr. Raymund: [phone]  In the event of inclement weather, please call our main line at [phone] for an update on the status of any delays or closures.  Dermatology Medication Tips: Please keep the boxes that topical medications come in in order to help keep track of the instructions about where and how to use these. Pharmacies typically print the medication instructions only on the boxes and not directly on the medication tubes.   If your medication is too expensive, please contact our office at [phone] option 4 or send us  a message through MyChart.   We are unable to tell what your co-pay for medications will be in advance  as this is different depending on your insurance coverage. However, we may be able to find a substitute medication at lower cost or fill out paperwork to get insurance to cover a needed medication.   If a prior authorization is required to get your medication covered by your insurance company, please allow us  1-2 business days to complete this process.  Drug prices often vary depending on where the prescription is filled and some pharmacies may offer cheaper prices.  The website www.goodrx.com contains coupons for medications through different pharmacies. The prices here do not account for what the cost  may be with help from insurance (it may be cheaper with your insurance), but the website can give you the price if you did not use any insurance.  - You can print the associated coupon and take it with your prescription to the pharmacy.  - You may also stop by our office during regular business hours and pick up a GoodRx coupon card.  - If you need your prescription sent electronically to a different pharmacy, notify our office through [facility] or by phone at [phone] option 4.     Si Usted Necesita Algo Despus de Su Visita  Tambin puede enviarnos un mensaje a travs de Clinical Cytogeneticist. Por lo general respondemos a los mensajes de MyChart en el transcurso de 1 a 2 das hbiles.  Para renovar recetas, por favor pida a su farmacia que se ponga en contacto con nuestra oficina. Randi lakes de fax es [place] [phone].  Si tiene un asunto urgente cuando la clnica est cerrada y que no puede esperar hasta el siguiente da hbil, puede llamar/localizar a su doctor(a) al nmero que aparece a continuacin.   Por favor, tenga en cuenta que aunque hacemos todo lo posible para estar disponibles para asuntos urgentes fuera del horario de [place], no estamos disponibles las 24 horas del da, los 7 [address]  [address] de la [place].   Si tiene un problema urgente y no puede comunicarse con nosotros, puede optar por buscar atencin mdica  en el consultorio de su doctor(a), en una clnica privada, en un centro de atencin urgente o en una sala de emergencias.  Si tiene engineer, drilling, por favor llame inmediatamente al 911 o vaya a la sala de emergencias.  Nmeros de bper  - Dr. Hester: [phone]  - Dra. Jackquline: [phone]  - Dr. Claudene: [phone]  - Dra. Kitts: [phone]  En caso de inclemencias del [place], por favor llame a nuestra lnea principal al [phone] para una actualizacin sobre el estado de cualquier retraso o cierre.  Consejos para la medicacin en dermatologa: Por  favor, guarde las cajas en las que vienen los medicamentos de uso tpico para ayudarle a seguir las instrucciones sobre dnde y cmo usarlos. Las farmacias generalmente imprimen las instrucciones del medicamento slo en las cajas y no directamente en los tubos del [place].   Si su medicamento es muy caro, por favor, pngase en contacto con landry rieger llamando al [phone] y presione la opcin 4 o envenos un mensaje a travs de Clinical Cytogeneticist.   No podemos decirle cul ser su copago por los medicamentos por adelantado ya que esto es diferente dependiendo de la cobertura de su seguro. Sin embargo, es posible que podamos encontrar un medicamento sustituto a audiological scientist un formulario para que el seguro cubra el medicamento que se considera necesario.   Si se requiere una autorizacin previa para que su compaa de seguros cubra su medicamento, por favor permtanos  de 1 a 2 das hbiles para completar [address]  Los precios de los medicamentos varan con frecuencia dependiendo del environmental consultant de dnde se surte la receta y alguna farmacias pueden ofrecer precios ms baratos.  El sitio web www.goodrx.com tiene cupones para medicamentos de health and safety inspector. Los precios aqu no tienen en cuenta lo que podra costar con la ayuda del seguro (puede ser ms barato con su seguro), pero el sitio web puede darle el precio si no utiliz tourist information centre manager.  - Puede imprimir el cupn correspondiente y llevarlo con su receta a la farmacia.  - Tambin puede pasar por nuestra oficina durante el horario de atencin regular y education officer, museum una tarjeta de cupones de GoodRx.  - Si necesita que su receta se enve electrnicamente a una farmacia diferente, informe a nuestra oficina a travs de MyChart de [place] o por telfono llamando al [phone] y presione la opcin 4.

## 2024-07-14 NOTE — Progress Notes (Signed)
 "  Follow-Up Visit   Subjective  Aaron Brown. is a 79 y.o. male who presents for the following: 6 month ak followup Patient mentions some spots under right ear, right temple, 3rd left finger and right scalp above ear  Hx of aks already treated and used   5 F/u Calcipotriene  cream to areas at forehead, temples and nose in October 2025. Reports did get very red and inflamed and healed with good result. Has not started cream on hands  Also recheck aks and early bccs at left post neck and right upper arm.    The patient has spots, moles and lesions to be evaluated, some may be new or changing and the patient may have concern these could be cancer.   The following portions of the chart were reviewed this encounter and updated as appropriate: medications, allergies, medical history  Review of Systems:  No other skin or systemic complaints except as noted in HPI or Assessment and Plan.  Objective  Well appearing patient in no apparent distress; mood and affect are within normal limits.  A focused examination was performed of the following areas: Face, scalp, hands, nose   Relevant exam findings are noted in the Assessment and Plan.  Left forehead x 1, left zygoma x 1, nasal dorsum x 1, right nasal tip x 1, right malar cheek x 1, right zygoma x 1, right mid ear helix x 1, right postauricular x 1, left postauricular x 1, left ear helix x 1, r hand x 3, left temple x 1, L 4 finger x1 (19) Erythematous thin papules/macules with gritty scale.   Assessment & Plan    ACTINIC DAMAGE WITH PRECANCEROUS ACTINIC KERATOSES Counseling for Topical Chemotherapy Management: Patient exhibits: - Severe, confluent actinic changes with pre-cancerous actinic keratoses that is secondary to cumulative UV radiation exposure over time - Condition that is severe; chronic, not at goal. - diffuse scaly erythematous macules and papules with underlying dyspigmentation - Discussed Prescription Field  Treatment topical Chemotherapy for Severe, Chronic Confluent Actinic Changes with Pre-Cancerous Actinic Keratoses Field treatment involves treatment of an entire area of skin that has confluent Actinic Changes (Sun/ Ultraviolet light damage) and PreCancerous Actinic Keratoses by method of PhotoDynamic Therapy (PDT) and/or prescription Topical Chemotherapy agents such as 5-fluorouracil , 5-fluorouracil /calcipotriene , and/or imiquimod.  The purpose is to decrease the number of clinically evident and subclinical PreCancerous lesions to prevent progression to development of skin cancer by chemically destroying early precancer changes that may or may not be visible.  It has been shown to reduce the risk of developing skin cancer in the treated area. As a result of treatment, redness, scaling, crusting, and open sores may occur during treatment course. One or more than one of these methods may be used and may have to be used several times to control, suppress and eliminate the PreCancerous changes. Discussed treatment course, expected reaction, and possible side effects. - Recommend daily broad spectrum sunscreen SPF 30+ to sun-exposed areas, reapply every 2 hours as needed.  - Staying in the shade or wearing long sleeves, sun glasses (UVA+UVB protection) and wide brim hats (4-inch brim around the entire circumference of the hat) are also recommended. - Call for new or changing lesions. - Good result to forehead/temples/nose with previous treatment course.   Re-start 5-fluorouracil /calcipotriene  cream twice a day for 7 - 10 days to affected areas including back of hands, neck behind ears . Prescription sent to [facility]. Patient advised they will receive an email  to purchase the medication online and have it sent to their home. Patient provided with handout reviewing treatment course and side effects and advised to call or message us  on MyChart with any concerns.  Reviewed course of  treatment and expected reaction.  Patient advised to expect inflammation and crusting and advised that erosions are possible.  Patient advised to be diligent with sun protection during and after treatment. Counseled to keep medication out of reach of children and pets.   HISTORY OF BASAL CELL CARCINOMA OF THE SKIN - No evidence of recurrence today - Recommend regular full body skin exams - Recommend daily broad spectrum sunscreen SPF 30+ to sun-exposed areas, reapply every 2 hours as needed.  - Call if any new or changing lesions are noted between office visits   HISTORY OF SQUAMOUS CELL CARCINOMA OF THE SKIN - No evidence of recurrence today - Recommend regular full body skin exams - Recommend daily broad spectrum sunscreen SPF 30+ to sun-exposed areas, reapply every 2 hours as needed.  - Call if any new or changing lesions are noted between office visits   SEBORRHEIC KERATOSIS - Stuck-on, waxy, tan-brown papules and/or plaques  - Benign-appearing - Discussed benign etiology and prognosis. - Observe - Call for any changes  Purpura - Chronic; persistent and recurrent.  Treatable, but not curable. - Violaceous macules and patches - Benign - Related to trauma, age, sun damage and/or use of blood thinners, chronic use of topical and/or oral steroids - Observe - Can use OTC arnica containing moisturizer such as Dermend Bruise Formula if desired - Call for worsening or other concerns  HISTORY OF PRECANCEROUS ACTINIC KERATOSIS Aks vs Early BCCs - L post neck, R upper arm.  Clear today  - site(s) of PreCancerous Actinic Keratosis clear today. - these may recur and new lesions may form requiring treatment to prevent transformation into skin cancer - observe for new or changing spots and contact [facility] for appointment if occur - photoprotection with sun protective clothing; sunglasses and broad spectrum sunscreen with SPF of at least 30 + and frequent self skin exams  recommended - yearly exams by a dermatologist recommended for persons with history of PreCancerous Actinic Keratoses ACTINIC KERATOSIS (19) Left forehead x 1, left zygoma x 1, nasal dorsum x 1, right nasal tip x 1, right malar cheek x 1, right zygoma x 1, right mid ear helix x 1, right postauricular x 1, left postauricular x 1, left ear helix x 1, r hand x 3, left temple x 1, L 4 finger x1 (19) Hypertrophic ak at L 4th finger  Actinic keratoses are precancerous spots that appear secondary to cumulative UV radiation exposure/sun exposure over time. They are chronic with expected duration over 1 year. A portion of actinic keratoses will progress to squamous cell carcinoma of the skin. It is not possible to reliably predict which spots will progress to skin cancer and so treatment is recommended to prevent development of skin cancer.  Recommend daily broad spectrum sunscreen SPF 30+ to sun-exposed areas, reapply every 2 hours as needed.  Recommend staying in the shade or wearing long sleeves, sun glasses (UVA+UVB protection) and wide brim hats (4-inch brim around the entire circumference of the hat). Call for new or changing lesions. - Destruction of lesion - Left forehead x 1, left zygoma x 1, nasal dorsum x 1, right nasal tip x 1, right malar cheek x 1, right zygoma x 1, right mid ear helix x 1, right postauricular  x 1, left postauricular x 1, left ear helix x 1, r hand x 3, left temple x 1, L 4 finger x1 (19)  Destruction method: cryotherapy   Informed consent: discussed and consent obtained   Lesion destroyed using liquid nitrogen: Yes   Region frozen until ice ball extended beyond lesion: Yes   Outcome: patient tolerated procedure well with no complications   Post-procedure details: wound care instructions given   Additional details:  Prior to procedure, discussed risks of blister formation, small wound, skin dyspigmentation, or rare scar following cryotherapy. Recommend Vaseline ointment to  treated areas while healing.    Return in about 6 months (around 01/11/2025) for TBSE.  I, Eleanor Blush, CMA, am acting as scribe for Rexene Rattler, MD.   Documentation: I have reviewed the above documentation for accuracy and completeness, and I agree with the above.  Rexene Rattler, MD    "

## 2024-07-15 ENCOUNTER — Ambulatory Visit (INDEPENDENT_AMBULATORY_CARE_PROVIDER_SITE_OTHER): Payer: PPO | Admitting: Family Medicine

## 2024-07-15 ENCOUNTER — Encounter: Payer: Self-pay | Admitting: Family Medicine

## 2024-07-15 VITALS — BP 138/86 | HR 82 | Temp 97.8°F | Ht 66.5 in | Wt 171.4 lb

## 2024-07-15 DIAGNOSIS — C61 Malignant neoplasm of prostate: Secondary | ICD-10-CM

## 2024-07-15 DIAGNOSIS — I10 Essential (primary) hypertension: Secondary | ICD-10-CM

## 2024-07-15 DIAGNOSIS — I340 Nonrheumatic mitral (valve) insufficiency: Secondary | ICD-10-CM

## 2024-07-15 DIAGNOSIS — Z23 Encounter for immunization: Secondary | ICD-10-CM

## 2024-07-15 DIAGNOSIS — Z7189 Other specified counseling: Secondary | ICD-10-CM

## 2024-07-15 DIAGNOSIS — R7303 Prediabetes: Secondary | ICD-10-CM

## 2024-07-15 DIAGNOSIS — K219 Gastro-esophageal reflux disease without esophagitis: Secondary | ICD-10-CM | POA: Diagnosis not present

## 2024-07-15 DIAGNOSIS — H409 Unspecified glaucoma: Secondary | ICD-10-CM

## 2024-07-15 DIAGNOSIS — Z0000 Encounter for general adult medical examination without abnormal findings: Secondary | ICD-10-CM | POA: Diagnosis not present

## 2024-07-15 DIAGNOSIS — R1310 Dysphagia, unspecified: Secondary | ICD-10-CM | POA: Diagnosis not present

## 2024-07-15 DIAGNOSIS — I2510 Atherosclerotic heart disease of native coronary artery without angina pectoris: Secondary | ICD-10-CM | POA: Diagnosis not present

## 2024-07-15 MED ORDER — AMLODIPINE BESYLATE 5 MG PO TABS: 5.0000 mg | ORAL_TABLET | Freq: Every day | ORAL | 3 refills | Status: AC

## 2024-07-15 MED ORDER — VALSARTAN 160 MG PO TABS: 160.0000 mg | ORAL_TABLET | Freq: Every day | ORAL | 3 refills | Status: AC

## 2024-07-15 MED ORDER — ATORVASTATIN CALCIUM 20 MG PO TABS: 20.0000 mg | ORAL_TABLET | Freq: Every day | ORAL | 3 refills | Status: AC

## 2024-07-15 NOTE — Assessment & Plan Note (Signed)
 Preventative protocols reviewed and updated unless pt declined. Discussed healthy diet and lifestyle.

## 2024-07-15 NOTE — Assessment & Plan Note (Signed)
 Reviewed limiting added sugar in diet.

## 2024-07-15 NOTE — Progress Notes (Signed)
 " Ph: [phone] Fax: [phone]   Patient ID: Aaron Brown., male    DOB: Jul 11, 1945, 79 y.o.   MRN: [id number]  This visit was conducted in person.  BP 138/86 (BP Location: Right Arm, Patient Position: Sitting, Cuff Size: Normal)   Pulse 82   Temp 97.8 F (36.6 C) (Oral)   Ht 5' 6.5 (1.689 m)   Wt 171 lb 6.4 oz (77.7 kg)   SpO2 97%   BMI 27.25 kg/m    CC: CPE Subjective:   [name] is a 79 y.o. male presenting on 07/15/2024 for Annual Exam (Pt states he's having some indigestion/ heart burn, would like sugguestions to help prevent it //Pt also states he has hiatal hernia he wasn't sure if he needed to get checked//Wants flu vax)   Saw health advisor 12/2023 for medicare wellness visit. Note reviewed.   No results found.  Flowsheet Row Office Visit from 07/15/2024 in [facility] [place]  PHQ-2 Total Score 0       07/15/2024    8:36 AM 03/20/2024   11:40 AM 03/13/2023    2:27 PM 01/08/2023    8:27 AM 05/05/2022    8:49 AM  Fall Risk   Falls in the past year? 0 0 0 0 0  Number falls in past yr: 0 0 0  0  Injury with Fall? 0 0  0   0   Risk for fall due to : No Fall Risks No Fall Risks No Fall Risks  Medication side effect  Follow up Falls evaluation completed Education provided;Falls prevention discussed Falls prevention discussed;Falls evaluation completed  Falls prevention discussed;Education provided;Falls evaluation completed      Data saved with a previous flowsheet row definition    Low risk prostate cancer monitoring with active surveillance Q80mo (PSA/MRI) followed by urology Aaron Brown).   Known multilevel herniated discs by MRI 11/2014. Uses flexeril  and etodolac  PRN - rarely.   Saw cardiology Dr Budd Kindle - coronary CTA 12/2020 with mild nonobstructive CAD 25% LAD, OM1, D1. Coronary CTA with calcium  score 462, mild proximal LAD, OM1, first diagonal stenosis.   2 months ago developed daily heartburn. He cut  out red wine and made some other changes - with benefit. Notes h/o hiatal hernia. He notes some pill dysphagia for 2-3 months but without odynophagia, unexpected weight loss, early satiety.    Preventative: COLONOSCOPY Date: 05/2005 1 polyp, int hem, told ok to rpt 10 yrs ([facility]).  Cologuard WNL 12/2016, 04/2020, 08/2023.  Prostate cancer followed by urology - see above.  Lung cancer screen - not eligible  Flu shot today COVID vaccine - Pfizer 10/2199 x2, no booster  Pneumovax 2014, prevnar-13 12/2013, prevnar-20 today Tdap 12/2011  Zostavax 2013  Shingrix - declines.  Advanced directive: received, scanned 07/2023. Wife Aaron Brown then daughter Aaron Brown are HCPOA. Would not want prolonged life if terminal condition, doesn't want feeding tube.  Seat belt use discussed  Sunscreen use discussed - R nasal BCC planned Moh's. Sees dermatologist Q6 mo. Non smoker  Alcohol - daily glass of wine - less recently  Dentist - Q6 mo Eye exam yearly for borderline glaucoma - planning to start eye drops Bowel - no constipation Bladder - no incontinence   Lives with wife   Grown children   Occupation: was emergency planning/management officer, now owns landscaping business   Edu: College BA   Activity: stays active at work, walking 2 mi/day, back exercises 30 min  every night.  Diet: good water, fruits/vegetables some. Follows mediterranean diet.      Relevant past medical, surgical, family and social history reviewed and updated as indicated. Interim medical history since our last visit reviewed. Allergies and medications reviewed and updated. Outpatient Medications Prior to Visit  Medication Sig Dispense Refill   aspirin  EC 81 MG tablet Take 1 tablet (81 mg total) by mouth daily. Swallow whole.     beta carotene w/minerals (OCUVITE) tablet Take 1 tablet by mouth daily.     calcipotriene  (DOVONOX) 0.005 % cream Apply to the nose BID x 4 days. 60 g 0   cholecalciferol (VITAMIN D) 1000 UNITS tablet Take 1,000 Units by mouth  daily.     clindamycin  (CLEOCIN  T) 1 % external solution Apply to affected areas scalp once to twice daily as needed for flares. 60 mL 5   cyclobenzaprine  (FLEXERIL ) 10 MG tablet Take 1 tablet (10 mg total) by mouth 2 (two) times daily as needed for muscle spasms. 40 tablet 0   etodolac  (LODINE ) 400 MG tablet Take 1 tablet (400 mg total) by mouth 2 (two) times daily as needed. 40 tablet 0   famotidine  (PEPCID ) 20 MG tablet TAKE 1 TABLET BY MOUTH DAILY AFTER SUPPER 90 tablet 0   fluorouracil  (EFUDEX ) 5 % cream Apply to nose BID x 4 days. 40 g 0   tadalafil  (CIALIS ) 20 MG tablet TAKE ONE TABLET ONE HOUR PRIOR TO INTERCOURSE 10 tablet 0   amLODipine  (NORVASC ) 5 MG tablet TAKE 1 TABLET (5 MG TOTAL) BY MOUTH DAILY. 90 tablet 4   atorvastatin  (LIPITOR) 20 MG tablet Take 1 tablet (20 mg total) by mouth daily. 90 tablet 3   valsartan  (DIOVAN ) 160 MG tablet TAKE 1 TABLET BY MOUTH EVERY DAY 90 tablet 2   No facility-administered medications prior to visit.     Per HPI unless specifically indicated in ROS section below Review of Systems  Constitutional:  Negative for activity change, appetite change, chills, fatigue, fever and unexpected weight change.  HENT:  Negative for hearing loss.   Eyes:  Negative for visual disturbance.  Respiratory:  Negative for cough, chest tightness, shortness of breath and wheezing.   Cardiovascular:  Negative for chest pain, palpitations and leg swelling.  Gastrointestinal:  Negative for abdominal distention, abdominal pain, blood in stool, constipation, diarrhea, nausea and vomiting.  Genitourinary:  Negative for difficulty urinating and hematuria.  Musculoskeletal:  Negative for arthralgias, myalgias and neck pain.  Skin:  Negative for rash.  Neurological:  Negative for dizziness, seizures, syncope and headaches.  Hematological:  Negative for adenopathy. Does not bruise/bleed easily.  Psychiatric/Behavioral:  Negative for dysphoric mood. The patient is not  nervous/anxious.     Objective:  BP 138/86 (BP Location: Right Arm, Patient Position: Sitting, Cuff Size: Normal)   Pulse 82   Temp 97.8 F (36.6 C) (Oral)   Ht 5' 6.5 (1.689 m)   Wt 171 lb 6.4 oz (77.7 kg)   SpO2 97%   BMI 27.25 kg/m   Wt Readings from Last 3 Encounters:  07/15/24 171 lb 6.4 oz (77.7 kg)  03/20/24 168 lb (76.2 kg)  11/14/23 168 lb (76.2 kg)      Physical Exam Vitals and nursing note reviewed.  Constitutional:      General: He is not in acute distress.    Appearance: Normal appearance. He is well-developed. He is not ill-appearing.  HENT:     Head: Normocephalic and atraumatic.     Right Ear:  Hearing, tympanic membrane, ear canal and external ear normal.     Left Ear: Hearing, tympanic membrane, ear canal and external ear normal.     Mouth/Throat:     Mouth: Mucous membranes are moist.     Pharynx: Oropharynx is clear. No oropharyngeal exudate or posterior oropharyngeal erythema.  Eyes:     General: No scleral icterus.    Extraocular Movements: Extraocular movements intact.     Conjunctiva/sclera: Conjunctivae normal.     Pupils: Pupils are equal, round, and reactive to light.  Neck:     Thyroid : No thyroid  mass or thyromegaly.     Vascular: No carotid bruit.  Cardiovascular:     Rate and Rhythm: Normal rate and regular rhythm.     Pulses: Normal pulses.          Radial pulses are 2+ on the right side and 2+ on the left side.     Heart sounds: Murmur (2/6 systolic USB) heard.  Pulmonary:     Effort: Pulmonary effort is normal. No respiratory distress.     Breath sounds: Normal breath sounds. No wheezing, rhonchi or rales.  Abdominal:     General: Bowel sounds are normal. There is no distension.     Palpations: Abdomen is soft. There is no mass.     Tenderness: There is no abdominal tenderness. There is no guarding or rebound.     Hernia: No hernia is present.  Musculoskeletal:        General: Normal range of motion.     Cervical back: Normal  range of motion and neck supple.     Right lower leg: No edema.     Left lower leg: No edema.  Lymphadenopathy:     Cervical: No cervical adenopathy.  Skin:    General: Skin is warm and dry.     Findings: No rash.  Neurological:     General: No focal deficit present.     Mental Status: He is alert and oriented to person, place, and time.  Psychiatric:        Mood and Affect: Mood normal.        Behavior: Behavior normal.        Thought Content: Thought content normal.        Judgment: Judgment normal.       Results for orders placed or performed in visit on 07/10/24  Hemoglobin A1c   Collection Time: 07/10/24  7:39 AM  Result Value Ref Range   Hgb A1c MFr Bld 6.2 4.6 - 6.5 %  Comprehensive metabolic panel with GFR   Collection Time: 07/10/24  7:39 AM  Result Value Ref Range   Sodium 135 135 - 145 mEq/L   Potassium 4.7 3.5 - 5.1 mEq/L   Chloride 102 96 - 112 mEq/L   CO2 28 19 - 32 mEq/L   Glucose, Bld 103 (H) 70 - 99 mg/dL   BUN 17 6 - 23 mg/dL   Creatinine, Ser 8.86 0.40 - 1.50 mg/dL   Total Bilirubin 0.7 0.2 - 1.2 mg/dL   Alkaline Phosphatase 55 39 - 117 U/L   AST 33 5 - 37 U/L   ALT 27 3 - 53 U/L   Total Protein 7.3 6.0 - 8.3 g/dL   Albumin 4.2 3.5 - 5.2 g/dL   GFR 37.05 >39.99 mL/min   Calcium  9.0 8.4 - 10.5 mg/dL  Lipid panel   Collection Time: 07/10/24  7:39 AM  Result Value Ref Range   Cholesterol 92 28 -  200 mg/dL   Triglycerides 37.9 89.9 - 149.0 mg/dL   HDL 48.59 >60.99 mg/dL   VLDL 87.5 0.0 - 59.9 mg/dL   LDL Cholesterol 28 10 - 99 mg/dL   Total CHOL/HDL Ratio 2    NonHDL 40.18    Lab Results  Component Value Date   PSA1 4.1 (H) 05/16/2024   PSA1 4.2 (H) 11/09/2023   PSA1 4.7 (H) 05/16/2023   PSA 4.62 (H) 03/16/2020   PSA 2.69 01/24/2019   PSA 3.00 01/07/2018   Assessment & Plan:   Problem List Items Addressed This Visit     Advanced care planning/counseling discussion (Chronic)   Previously discussed      Health maintenance examination  - Primary (Chronic)   Preventative protocols reviewed and updated unless pt declined. Discussed healthy diet and lifestyle.       Essential hypertension   Chronic, stable period on current regimen - continue       Relevant Medications   atorvastatin  (LIPITOR) 20 MG tablet   amLODipine  (NORVASC ) 5 MG tablet   valsartan  (DIOVAN ) 160 MG tablet   Glaucoma   Regularly sees eye doctor - not on eye drops       Prediabetes   Reviewed limiting added sugar in diet      Mitral valve regurgitation   Mild , will continue to monitor.       Relevant Medications   atorvastatin  (LIPITOR) 20 MG tablet   amLODipine  (NORVASC ) 5 MG tablet   valsartan  (DIOVAN ) 160 MG tablet   Prostate cancer (HCC)   Regularly sees urology [facility]) for active surveillance - appreciate their care      Gastroesophageal reflux disease without esophagitis   Notes returning symptoms - dietary /lifestyle measures reviewed. Will restart pepcid  20mg  daily and let me know if ongoing symptoms for further evaluation.  Avoid etodolac .       CAD (coronary artery disease)   Appreciate cardiology care.       Relevant Medications   atorvastatin  (LIPITOR) 20 MG tablet   amLODipine  (NORVASC ) 5 MG tablet   valsartan  (DIOVAN ) 160 MG tablet   Pill dysphagia   Relatively new onset, overall improving with recent changes made.  No red flags.  Start pepcid , update if ongoing for further eval/treatment - pt agrees with plan.       Other Visit Diagnoses       Need for vaccination against Streptococcus pneumoniae       Relevant Orders   Pneumococcal conjugate vaccine 20-valent (Prevnar 20) (Completed)     Encounter for immunization       Relevant Orders   Flu vaccine HIGH DOSE PF(Fluzone Trivalent) (Completed)        Meds ordered this encounter  Medications   atorvastatin  (LIPITOR) 20 MG tablet    Sig: Take 1 tablet (20 mg total) by mouth daily.    Dispense:  90 tablet    Refill:  3   amLODipine  (NORVASC ) 5 MG  tablet    Sig: Take 1 tablet (5 mg total) by mouth daily.    Dispense:  90 tablet    Refill:  3   valsartan  (DIOVAN ) 160 MG tablet    Sig: Take 1 tablet (160 mg total) by mouth daily.    Dispense:  90 tablet    Refill:  3    Orders Placed This Encounter  Procedures   Pneumococcal conjugate vaccine 20-valent (Prevnar 20)   Flu vaccine HIGH DOSE PF(Fluzone Trivalent)    Patient Instructions  Flu shot today  Prevnar-20 today  Avoidance of citrus, fatty foods, chocolate, peppermint, and excessive alcohol, along with sodas, orange juice (acidic drinks) At least a few hours between dinner and bed, minimize naps after eating. Take pepcid  20mg  daily - if ongoing tight feeling, let me know for further evaluation/treatment.  Good to see you today  Return as needed or in 1 year for next physical.   Follow up plan: Return in about 1 year (around 07/15/2025), or if symptoms worsen or fail to improve, for annual exam, prior fasting for blood work, medicare wellness visit.  Anton Blas, MD   "

## 2024-07-15 NOTE — Assessment & Plan Note (Addendum)
 Regularly sees urology [facility]) for active surveillance - appreciate their care

## 2024-07-15 NOTE — Assessment & Plan Note (Signed)
"  Previously discussed    "

## 2024-07-15 NOTE — Assessment & Plan Note (Addendum)
 Regularly sees eye doctor - not on eye drops

## 2024-07-15 NOTE — Patient Instructions (Addendum)
 Flu shot today  Prevnar-20 today  Avoidance of citrus, fatty foods, chocolate, peppermint, and excessive alcohol, along with sodas, orange juice (acidic drinks) At least a few hours between dinner and bed, minimize naps after eating. Take pepcid  20mg  daily - if ongoing tight feeling, let me know for further evaluation/treatment.  Good to see you today  Return as needed or in 1 year for next physical.

## 2024-07-15 NOTE — Assessment & Plan Note (Signed)
 Chronic, stable period on current regimen - continue

## 2024-07-15 NOTE — Assessment & Plan Note (Signed)
Appreciate cardiology care.  °

## 2024-07-15 NOTE — Assessment & Plan Note (Signed)
 Mild, will continue to monitor.

## 2024-07-15 NOTE — Assessment & Plan Note (Addendum)
 Notes returning symptoms - dietary /lifestyle measures reviewed. Will restart pepcid  20mg  daily and let me know if ongoing symptoms for further evaluation.  Avoid etodolac .

## 2024-07-15 NOTE — Assessment & Plan Note (Signed)
 Relatively new onset, overall improving with recent changes made.  No red flags.  Start pepcid , update if ongoing for further eval/treatment - pt agrees with plan.

## 2024-11-13 ENCOUNTER — Other Ambulatory Visit

## 2024-11-14 ENCOUNTER — Ambulatory Visit: Admitting: Urology

## 2025-01-20 ENCOUNTER — Ambulatory Visit: Admitting: Dermatology

## 2025-03-24 ENCOUNTER — Ambulatory Visit

## 2025-07-13 ENCOUNTER — Other Ambulatory Visit

## 2025-07-20 ENCOUNTER — Encounter: Admitting: Family Medicine
